# Patient Record
Sex: Male | Born: 1965
Health system: Southern US, Community
[De-identification: ages and names within clinical notes are randomized; demographics above are authoritative.]

## PROBLEM LIST (undated history)

## (undated) DIAGNOSIS — K439 Ventral hernia without obstruction or gangrene: Secondary | ICD-10-CM

## (undated) DIAGNOSIS — K219 Gastro-esophageal reflux disease without esophagitis: Secondary | ICD-10-CM

## (undated) DIAGNOSIS — E785 Hyperlipidemia, unspecified: Secondary | ICD-10-CM

## (undated) HISTORY — PX: BACK SURGERY: SHX140

## (undated) HISTORY — PX: HERNIA REPAIR: SHX51

## (undated) HISTORY — PX: UMBILICAL HERNIA REPAIR: SHX196

## (undated) HISTORY — PX: ADENOIDECTOMY: SHX5191

---

## 1988-07-29 HISTORY — PX: WRIST FRACTURE SURGERY: SHX121

## 1988-07-29 HISTORY — PX: ORIF CLAVICULAR FRACTURE: SHX5055

## 1999-03-05 ENCOUNTER — Encounter: Payer: Self-pay | Admitting: Emergency Medicine

## 1999-03-05 ENCOUNTER — Emergency Department (HOSPITAL_COMMUNITY): Admission: EM | Admit: 1999-03-05 | Discharge: 1999-03-05 | Payer: Self-pay | Admitting: Emergency Medicine

## 2001-09-25 ENCOUNTER — Ambulatory Visit (HOSPITAL_COMMUNITY): Admission: RE | Admit: 2001-09-25 | Discharge: 2001-09-25 | Payer: Self-pay | Admitting: Family Medicine

## 2001-09-25 ENCOUNTER — Encounter: Payer: Self-pay | Admitting: Family Medicine

## 2001-10-15 ENCOUNTER — Ambulatory Visit (HOSPITAL_COMMUNITY): Admission: RE | Admit: 2001-10-15 | Discharge: 2001-10-16 | Payer: Self-pay | Admitting: Neurosurgery

## 2001-10-15 ENCOUNTER — Encounter: Payer: Self-pay | Admitting: Neurosurgery

## 2001-10-27 HISTORY — PX: MICRODISCECTOMY LUMBAR: SUR864

## 2005-06-17 ENCOUNTER — Ambulatory Visit: Payer: Self-pay | Admitting: Cardiovascular Disease

## 2005-10-28 ENCOUNTER — Emergency Department (HOSPITAL_COMMUNITY): Admission: EM | Admit: 2005-10-28 | Discharge: 2005-10-28 | Payer: Self-pay | Admitting: Emergency Medicine

## 2011-04-08 ENCOUNTER — Encounter (INDEPENDENT_AMBULATORY_CARE_PROVIDER_SITE_OTHER): Payer: Self-pay | Admitting: General Surgery

## 2011-06-27 ENCOUNTER — Encounter (INDEPENDENT_AMBULATORY_CARE_PROVIDER_SITE_OTHER): Payer: Self-pay | Admitting: General Surgery

## 2011-07-12 ENCOUNTER — Ambulatory Visit (INDEPENDENT_AMBULATORY_CARE_PROVIDER_SITE_OTHER): Payer: BC Managed Care – PPO | Admitting: General Surgery

## 2011-07-12 ENCOUNTER — Encounter (INDEPENDENT_AMBULATORY_CARE_PROVIDER_SITE_OTHER): Payer: Self-pay | Admitting: General Surgery

## 2011-07-12 ENCOUNTER — Other Ambulatory Visit (INDEPENDENT_AMBULATORY_CARE_PROVIDER_SITE_OTHER): Payer: Self-pay | Admitting: General Surgery

## 2011-07-12 VITALS — BP 146/98 | HR 68 | Temp 96.8°F | Resp 16 | Ht 72.0 in | Wt 220.0 lb

## 2011-07-12 DIAGNOSIS — K439 Ventral hernia without obstruction or gangrene: Secondary | ICD-10-CM

## 2011-07-12 NOTE — Patient Instructions (Signed)
Hernia Repair with Laparoscope A hernia occurs when an internal organ pushes out through a weak spot in the belly (abdominal) wall muscles. Hernias most commonly occur in the groin and around the navel. Hernias can also occur through a cut by the surgeon (incision) after an abdominal operation. A hernia may be caused by:  Lifting heavy objects.   Prolonged coughing.   Straining to move your bowels.  Hernias can often be pushed back into place (reduced). Most hernias tend to get worse over time. Problems occur when abdominal contents get stuck in the opening and the blood supply is blocked or impaired (incarcerated hernia). Because of these risks, you require surgery to repair the hernia. Your hernia will be repaired using a laparoscope. Laparoscopic surgery is a type of minimally invasive surgery. It does not involve making a typical surgical cut (incision) in the skin. A laparoscope is a telescope-like rod and lens system. It is usually connected to a video camera and a light source so your caregiver can clearly see the operative area. The instruments are inserted through  to  inch (5 mm or 10 mm) openings in the skin at specific locations. A working and viewing space is created by blowing a small amount of carbon dioxide gas into the abdominal cavity. The abdomen is essentially blown up like a balloon (insufflated). This elevates the abdominal wall above the internal organs like a dome. The carbon dioxide gas is common to the human body and can be absorbed by tissue and removed by the respiratory system. Once the repair is completed, the small incisions will be closed with either stitches (sutures) or staples (just like a paper stapler only this staple holds the skin together). LET YOUR CAREGIVERS KNOW ABOUT:  Allergies.   Medications taken including herbs, eye drops, over the counter medications, and creams.   Use of steroids (by mouth or creams).   Previous problems with anesthetics or  Novocaine.   Possibility of pregnancy, if this applies.   History of blood clots (thrombophlebitis).   History of bleeding or blood problems.   Previous surgery.   Other health problems.  BEFORE THE PROCEDURE  Laparoscopy can be done either in a hospital or out-patient clinic. You may be given a mild sedative to help you relax before the procedure. Once in the operating room, you will be given a general anesthesia to make you sleep (unless you and your caregiver choose a different anesthetic).  AFTER THE PROCEDURE  After the procedure you will be watched in a recovery area. Depending on what type of hernia was repaired, you might be admitted to the hospital or you might go home the same day. With this procedure you may have less pain and scarring. This usually results in a quicker recovery and less risk of infection. HOME CARE INSTRUCTIONS   Bed rest is not required. You may continue your normal activities but avoid heavy lifting (more than 10 pounds) or straining.   Cough gently. If you are a smoker it is best to stop, as even the best hernia repair can break down with the continual strain of coughing.   Avoid driving until given the OK by your surgeon.   There are no dietary restrictions unless given otherwise.   TAKE ALL MEDICATIONS AS DIRECTED.   Only take over-the-counter or prescription medicines for pain, discomfort, or fever as directed by your caregiver.  SEEK MEDICAL CARE IF:   There is increasing abdominal pain or pain in your incisions.  There is more bleeding from incisions, other than minimal spotting.   You feel light headed or faint.   You develop an unexplained fever, chills, and/or an oral temperature above 102 F (38.9 C).   You have redness, swelling, or increasing pain in the wound.   Pus coming from wound.   A foul smell coming from the wound or dressings.  SEEK IMMEDIATE MEDICAL CARE IF:   You develop a rash.   You have difficulty breathing.    You have any allergic problems.  MAKE SURE YOU:   Understand these instructions.   Will watch your condition.   Will get help right away if you are not doing well or get worse.  Document Released: 08/15/2005 Document Revised: 04/27/2011 Document Reviewed: 07/15/2009 [facility] Patient Information 2012 [place], [place].

## 2011-07-12 NOTE — Progress Notes (Signed)
Subjective:   Recurrent hernia  Patient ID: Brian [name], male   DOB: 01/12/1966, 45 y.o.   MRN: [id number]  HPI Patient returns to the office for followup of his recurrent supraumbilical hernia. I had repaired his hernia with a 6 cm ventral patch in 2011. He had a fairly quick recurrence that I have followed  since early this year. I had previously recommended repair but he was minimally symptomatic and deferred. He now however is having some increasing discomfort in the area with exercise. No GI symptoms. History reviewed. No pertinent past medical history. Past Surgical History  Procedure Date  . Hernia repair 08/03/2011  . Back surgery 10/2001  . Shoulder surgery 07/1988  . Wrist surgery 07/1988   Current Outpatient Prescriptions  Medication Sig Dispense Refill  . Niacin-Simvastatin (SIMCOR) 500-40 MG TB24 Take by mouth daily.         No Known Allergies History  Substance Use Topics  . Smoking status: Never Smoker   . Smokeless tobacco: Never Used  . Alcohol Use: No     Review of Systems  HENT: Negative.   Respiratory: Negative.   Cardiovascular: Negative.   Gastrointestinal: Negative for nausea and vomiting.       Objective:   Physical Exam General: Moderately overweight Caucasian male in no acute distress Skin: Warm and dry without rash or infection HEENT: No palpable masses. Oropharynx clear. Lungs: Clear without wheezing or increased work of breathing Cardiovascular: Regular rate and rhythm without murmur Abdomen: Just above and to the right of the umbilicus is an approximately 5 or 6 cm palpable hernia that is reducible, slightly tender, and feels becoming through a much smaller defect.    Assessment:     Recurrent supraumbilical hernia following open repair with ventral patch. As we had previously discussed earlier this year I recommended proceeding with laparoscopic repair. We discussed the nature of the procedure and its indications and risks of general  anesthesia, leaving, infection, recurrence, rare risk of bowel or visceral injury or chronic scarring and pain.All his qestions were answered and he is ready to proceed    Plan:     Laparoscopic repair of a recurrent supraumbilical hernia as an outpatient under general anesthesia.      

## 2011-07-18 ENCOUNTER — Encounter (HOSPITAL_COMMUNITY): Payer: Self-pay | Admitting: Pharmacy Technician

## 2011-07-18 ENCOUNTER — Encounter (HOSPITAL_COMMUNITY)
Admission: RE | Admit: 2011-07-18 | Discharge: 2011-07-18 | Disposition: A | Discharge status: Home / Self Care | Payer: BC Managed Care – PPO | Source: Ambulatory Visit | Attending: General Surgery | Admitting: General Surgery

## 2011-07-18 ENCOUNTER — Encounter (HOSPITAL_COMMUNITY): Payer: Self-pay

## 2011-07-18 DIAGNOSIS — E785 Hyperlipidemia, unspecified: Secondary | ICD-10-CM | POA: Insufficient documentation

## 2011-07-18 DIAGNOSIS — K439 Ventral hernia without obstruction or gangrene: Secondary | ICD-10-CM | POA: Insufficient documentation

## 2011-07-18 DIAGNOSIS — K219 Gastro-esophageal reflux disease without esophagitis: Secondary | ICD-10-CM | POA: Insufficient documentation

## 2011-07-18 HISTORY — DX: Ventral hernia without obstruction or gangrene: K43.9

## 2011-07-18 HISTORY — DX: Hyperlipidemia, unspecified: E78.5

## 2011-07-18 HISTORY — DX: Gastro-esophageal reflux disease without esophagitis: K21.9

## 2011-07-18 LAB — SURGICAL PCR SCREEN
MRSA, PCR: NEGATIVE
Staphylococcus aureus: POSITIVE — AB

## 2011-07-18 MED ORDER — CEFAZOLIN SODIUM 1-5 GM-% IV SOLN: 1.0000 g | INTRAVENOUS | Status: DC

## 2011-07-18 NOTE — Patient Instructions (Signed)
[name]  07/18/2011   Your procedure is scheduled on:  07-27-11  Report to [facility] at  0630 AM.  Call this number if you have problems the morning of surgery: [phone]   Remember:   Do not eat food:After Midnight.  Do not drink clear liquids: After Midnight.  Take these medicines the morning of surgery with A SIP OF WATER: omeprazole   Do not wear jewelry, make-up or nail polish.  Do not wear lotions, powders, or perfumes. You may wear deodorant.  Do not shave 48 hours prior to surgery.  Do not bring valuables to the hospital.  Contacts, dentures or bridgework may not be worn into surgery.  Leave suitcase in the car. After surgery it may be brought to your room.  For patients admitted to the hospital, checkout time is 11:00 AM the day of discharge.   Patients discharged the day of surgery will not be allowed to drive home.  Name and phone number of your driver:   Phelix Fudala, [phone]  Special Instructions: CHG Shower Use Special Wash: 1/2 bottle night before surgery and 1/2 bottle morning of surgery.   Please read over the following fact sheets that you were given: MRSA Information

## 2011-07-18 NOTE — Pre-Procedure Instructions (Addendum)
07-18-11 No labs required, EKG, CXR. States had EKG with PCP-Dr. Rosalene Billings called for to be faxed. 07-26-11 EKG(05-24-11) report with chart.

## 2011-07-27 ENCOUNTER — Ambulatory Visit (HOSPITAL_COMMUNITY): Payer: BC Managed Care – PPO | Admitting: Anesthesiology

## 2011-07-27 ENCOUNTER — Observation Stay (HOSPITAL_COMMUNITY)
Admission: AD | Admit: 2011-07-27 | Discharge: 2011-07-28 | DRG: 160 | Disposition: A | Discharge status: Home / Self Care | Payer: BC Managed Care – PPO | Source: Ambulatory Visit | Attending: General Surgery | Admitting: General Surgery

## 2011-07-27 ENCOUNTER — Encounter (HOSPITAL_COMMUNITY)
Admission: AD | Disposition: A | Discharge status: Home / Self Care | Payer: Self-pay | Source: Ambulatory Visit | Attending: General Surgery

## 2011-07-27 ENCOUNTER — Encounter (HOSPITAL_COMMUNITY): Payer: Self-pay | Admitting: Anesthesiology

## 2011-07-27 ENCOUNTER — Encounter (HOSPITAL_COMMUNITY): Payer: Self-pay

## 2011-07-27 DIAGNOSIS — K439 Ventral hernia without obstruction or gangrene: Principal | ICD-10-CM | POA: Diagnosis present

## 2011-07-27 DIAGNOSIS — Z01812 Encounter for preprocedural laboratory examination: Secondary | ICD-10-CM | POA: Insufficient documentation

## 2011-07-27 HISTORY — PX: VENTRAL HERNIA REPAIR: SHX424

## 2011-07-27 LAB — CREATININE, SERUM
Creatinine, Ser: 1.03 mg/dL (ref 0.50–1.35)
GFR calc Af Amer: >90 mL/min (ref >90)
GFR calc non Af Amer: 85 mL/min — ABNORMAL LOW (ref >90)

## 2011-07-27 LAB — CBC
HCT: 39.8 % (ref 39.0–52.0)
Hemoglobin: 13.7 g/dL (ref 13.0–17.0)
MCH: 31.4 pg (ref 26.0–34.0)
MCHC: 34.4 g/dL (ref 30.0–36.0)
MCV: 91.1 fL (ref 78.0–100.0)
Platelets: 192 10*3/uL (ref 150–400)
RBC: 4.37 MIL/uL (ref 4.22–5.81)
RDW: 12.6 % (ref 11.5–15.5)
WBC: 6 10*3/uL (ref 4.0–10.5)

## 2011-07-27 SURGERY — REPAIR, HERNIA, VENTRAL, LAPAROSCOPIC
Anesthesia: General | Site: Abdomen | Wound class: Clean

## 2011-07-27 MED ORDER — HYDROMORPHONE HCL PF 1 MG/ML IJ SOLN
INTRAMUSCULAR | Status: DC | PRN
  Administered 2011-07-27 (×2): 1 mg via INTRAVENOUS

## 2011-07-27 MED ORDER — HYDROMORPHONE HCL PF 1 MG/ML IJ SOLN: 0.2500 mg | INTRAMUSCULAR | Status: DC | PRN

## 2011-07-27 MED ORDER — DEXTROSE IN LACTATED RINGERS 5 % IV SOLN
INTRAVENOUS | Status: DC
  Administered 2011-07-27 – 2011-07-28 (×2): via INTRAVENOUS

## 2011-07-27 MED ORDER — ACETAMINOPHEN 325 MG PO TABS
650.0000 mg | ORAL_TABLET | ORAL | Status: DC | PRN
  Administered 2011-07-27: 650 mg via ORAL
  Filled 2011-07-27: qty 2

## 2011-07-27 MED ORDER — PROPOFOL 10 MG/ML IV EMUL
INTRAVENOUS | Status: DC | PRN
  Administered 2011-07-27: 150 mg via INTRAVENOUS

## 2011-07-27 MED ORDER — ACETAMINOPHEN 10 MG/ML IV SOLN
INTRAVENOUS | Status: DC | PRN
  Administered 2011-07-27: 1000 mg via INTRAVENOUS

## 2011-07-27 MED ORDER — MIDAZOLAM HCL 5 MG/5ML IJ SOLN
INTRAMUSCULAR | Status: DC | PRN
  Administered 2011-07-27: 2 mg via INTRAVENOUS

## 2011-07-27 MED ORDER — BUPIVACAINE-EPINEPHRINE 0.25% -1:200000 IJ SOLN
INTRAMUSCULAR | Status: DC | PRN
  Administered 2011-07-27: 60 mL

## 2011-07-27 MED ORDER — OXYCODONE-ACETAMINOPHEN 5-325 MG PO TABS
1.0000 | ORAL_TABLET | ORAL | Status: DC | PRN
  Administered 2011-07-27 – 2011-07-28 (×2): 1 via ORAL
  Administered 2011-07-28: 2 via ORAL
  Administered 2011-07-28: 1 via ORAL
  Filled 2011-07-27 (×5): qty 1

## 2011-07-27 MED ORDER — PROMETHAZINE HCL 25 MG/ML IJ SOLN: 6.2500 mg | INTRAMUSCULAR | Status: DC | PRN

## 2011-07-27 MED ORDER — ONDANSETRON HCL 4 MG/2ML IJ SOLN
INTRAMUSCULAR | Status: DC | PRN
  Administered 2011-07-27: 4 mg via INTRAVENOUS

## 2011-07-27 MED ORDER — DEXAMETHASONE SODIUM PHOSPHATE 10 MG/ML IJ SOLN
INTRAMUSCULAR | Status: DC | PRN
  Administered 2011-07-27: 10 mg via INTRAVENOUS

## 2011-07-27 MED ORDER — ONDANSETRON HCL 4 MG/2ML IJ SOLN: 4.0000 mg | Freq: Four times a day (QID) | INTRAMUSCULAR | Status: DC | PRN

## 2011-07-27 MED ORDER — NEOSTIGMINE METHYLSULFATE 1 MG/ML IJ SOLN
INTRAMUSCULAR | Status: DC | PRN
  Administered 2011-07-27: 3 mg via INTRAVENOUS

## 2011-07-27 MED ORDER — HEPARIN SODIUM (PORCINE) 5000 UNIT/ML IJ SOLN
5000.0000 [IU] | Freq: Three times a day (TID) | INTRAMUSCULAR | Status: DC
  Administered 2011-07-27 – 2011-07-28 (×2): 5000 [IU] via SUBCUTANEOUS
  Filled 2011-07-27 (×5): qty 1

## 2011-07-27 MED ORDER — ROCURONIUM BROMIDE 100 MG/10ML IV SOLN
INTRAVENOUS | Status: DC | PRN
  Administered 2011-07-27 (×2): 10 mg via INTRAVENOUS
  Administered 2011-07-27: 50 mg via INTRAVENOUS
  Administered 2011-07-27: 10 mg via INTRAVENOUS

## 2011-07-27 MED ORDER — FENTANYL CITRATE 0.05 MG/ML IJ SOLN
INTRAMUSCULAR | Status: DC | PRN
  Administered 2011-07-27: 50 ug via INTRAVENOUS
  Administered 2011-07-27: 150 ug via INTRAVENOUS
  Administered 2011-07-27: 50 ug via INTRAVENOUS

## 2011-07-27 MED ORDER — LIDOCAINE HCL (CARDIAC) 20 MG/ML IV SOLN
INTRAVENOUS | Status: DC | PRN
  Administered 2011-07-27: 100 mg via INTRAVENOUS

## 2011-07-27 MED ORDER — LACTATED RINGERS IV SOLN
INTRAVENOUS | Status: DC | PRN
  Administered 2011-07-27 (×2): via INTRAVENOUS

## 2011-07-27 MED ORDER — LACTATED RINGERS IV SOLN: INTRAVENOUS | Status: DC

## 2011-07-27 MED ORDER — MORPHINE SULFATE 2 MG/ML IJ SOLN: 2.0000 mg | INTRAMUSCULAR | Status: DC | PRN

## 2011-07-27 MED ORDER — ONDANSETRON HCL 4 MG PO TABS: 4.0000 mg | ORAL_TABLET | Freq: Four times a day (QID) | ORAL | Status: DC | PRN

## 2011-07-27 MED ORDER — CEFAZOLIN SODIUM-DEXTROSE 2-3 GM-% IV SOLR
2.0000 g | INTRAVENOUS | Status: AC
  Administered 2011-07-27: 2 g via INTRAVENOUS

## 2011-07-27 MED ORDER — SODIUM CHLORIDE 0.9 % IR SOLN
Status: DC | PRN
  Administered 2011-07-27: 1000 mL

## 2011-07-27 MED ORDER — GLYCOPYRROLATE 0.2 MG/ML IJ SOLN
INTRAMUSCULAR | Status: DC | PRN
  Administered 2011-07-27: .4 mg via INTRAVENOUS

## 2011-07-27 SURGICAL SUPPLY — 51 items
APPLIER CLIP 5 13 M/L LIGAMAX5 (MISCELLANEOUS)
BENZOIN TINCTURE PRP APPL 2/3 (GAUZE/BANDAGES/DRESSINGS) IMPLANT
BINDER ABD UNIV 10 28-50 (GAUZE/BANDAGES/DRESSINGS) ×1 IMPLANT
BINDER ABD UNIV 12 45-62 (WOUND CARE) IMPLANT
BINDER ABDOM UNIV 10 (GAUZE/BANDAGES/DRESSINGS) ×2
BINDER ABDOMINAL 46IN 62IN (WOUND CARE)
CANISTER SUCTION 2500CC (MISCELLANEOUS) ×2 IMPLANT
CLIP APPLIE 5 13 M/L LIGAMAX5 (MISCELLANEOUS) IMPLANT
CLOTH BEACON ORANGE TIMEOUT ST (SAFETY) ×2 IMPLANT
DECANTER SPIKE VIAL GLASS SM (MISCELLANEOUS) IMPLANT
DERMABOND ADVANCED (GAUZE/BANDAGES/DRESSINGS) ×1
DERMABOND ADVANCED .7 DNX12 (GAUZE/BANDAGES/DRESSINGS) ×1 IMPLANT
DEVICE SECURE STRAP 25 ABSORB (INSTRUMENTS) ×2 IMPLANT
DEVICE TROCAR PUNCTURE CLOSURE (ENDOMECHANICALS) ×2 IMPLANT
DISSECTOR BLUNT TIP ENDO 5MM (MISCELLANEOUS) IMPLANT
DRAPE INCISE IOBAN 66X45 STRL (DRAPES) ×2 IMPLANT
DRAPE LAPAROSCOPIC ABDOMINAL (DRAPES) ×2 IMPLANT
ELECT REM PT RETURN 9FT ADLT (ELECTROSURGICAL) ×2
ELECTRODE REM PT RTRN 9FT ADLT (ELECTROSURGICAL) ×1 IMPLANT
GLOVE BIOGEL PI IND STRL 7.0 (GLOVE) ×1 IMPLANT
GLOVE BIOGEL PI INDICATOR 7.0 (GLOVE) ×1
GOWN STRL NON-REIN LRG LVL3 (GOWN DISPOSABLE) ×2 IMPLANT
GOWN STRL REIN XL XLG (GOWN DISPOSABLE) ×4 IMPLANT
KIT BASIN OR (CUSTOM PROCEDURE TRAY) ×2 IMPLANT
MESH PARIETEX 6X4 (Mesh General) ×2 IMPLANT
NEEDLE SPNL 22GX3.5 QUINCKE BK (NEEDLE) ×2 IMPLANT
NS IRRIG 1000ML POUR BTL (IV SOLUTION) ×2 IMPLANT
PEN SKIN MARKING BROAD (MISCELLANEOUS) ×2 IMPLANT
PENCIL BUTTON HOLSTER BLD 10FT (ELECTRODE) ×2 IMPLANT
SCALPEL HARMONIC ACE (MISCELLANEOUS) IMPLANT
SCISSORS LAP 5X35 DISP (ENDOMECHANICALS) IMPLANT
SET IRRIG TUBING LAPAROSCOPIC (IRRIGATION / IRRIGATOR) IMPLANT
SLEEVE ADV FIXATION 5X100MM (TROCAR) IMPLANT
SLEEVE Z-THREAD 5X100MM (TROCAR) ×2 IMPLANT
SOLUTION ANTI FOG 6CC (MISCELLANEOUS) ×2 IMPLANT
STRIP CLOSURE SKIN 1/2X4 (GAUZE/BANDAGES/DRESSINGS) IMPLANT
SUT MNCRL AB 4-0 PS2 18 (SUTURE) ×2 IMPLANT
SUT NOVA NAB GS-21 0 18 T12 DT (SUTURE) ×4 IMPLANT
SUT PROLENE 0 CT 1 CR/8 (SUTURE) IMPLANT
TACKER 5MM HERNIA 3.5CML NAB (ENDOMECHANICALS) IMPLANT
TOWEL OR 17X26 10 PK STRL BLUE (TOWEL DISPOSABLE) ×2 IMPLANT
TRAY FOLEY CATH 14FRSI W/METER (CATHETERS) IMPLANT
TRAY LAP CHOLE (CUSTOM PROCEDURE TRAY) ×2 IMPLANT
TROCAR ADV FIXATION 11X100MM (TROCAR) IMPLANT
TROCAR ADV FIXATION 5X100MM (TROCAR) IMPLANT
TROCAR BLADELESS OPT 5 75 (ENDOMECHANICALS) ×2 IMPLANT
TROCAR XCEL NON-BLD 11X100MML (ENDOMECHANICALS) IMPLANT
TROCAR Z-THREAD FIOS 11X100 BL (TROCAR) ×2 IMPLANT
TROCAR Z-THREAD FIOS 5X100MM (TROCAR) ×2 IMPLANT
TROCAR Z-THREAD SLEEVE 11X100 (TROCAR) IMPLANT
TUBING INSUFFLATION 10FT LAP (TUBING) ×2 IMPLANT

## 2011-07-27 NOTE — H&P (View-Only) (Signed)
Subjective:   Recurrent hernia  Patient ID: Brian [name], male   DOB: 1966/01/02, 45 y.o.   MRN: [id number]  HPI Patient returns to the office for followup of his recurrent supraumbilical hernia. I had repaired his hernia with a 6 cm ventral patch in 2011. He had a fairly quick recurrence that I have followed  since early this year. I had previously recommended repair but he was minimally symptomatic and deferred. He now however is having some increasing discomfort in the area with exercise. No GI symptoms. History reviewed. No pertinent past medical history. Past Surgical History  Procedure Date  . Hernia repair 08/03/2011  . Back surgery 10/2001  . Shoulder surgery 07/1988  . Wrist surgery 07/1988   Current Outpatient Prescriptions  Medication Sig Dispense Refill  . Niacin-Simvastatin [facility]) 500-40 MG TB24 Take by mouth daily.         No Known Allergies History  Substance Use Topics  . Smoking status: Never Smoker   . Smokeless tobacco: Never Used  . Alcohol Use: No     Review of Systems  HENT: Negative.   Respiratory: Negative.   Cardiovascular: Negative.   Gastrointestinal: Negative for nausea and vomiting.       Objective:   Physical Exam General: Moderately overweight Caucasian male in no acute distress Skin: Warm and dry without rash or infection HEENT: No palpable masses. Oropharynx clear. Lungs: Clear without wheezing or increased work of breathing Cardiovascular: Regular rate and rhythm without murmur Abdomen: Just above and to the right of the umbilicus is an approximately 5 or 6 cm palpable hernia that is reducible, slightly tender, and feels becoming through a much smaller defect.    Assessment:     Recurrent supraumbilical hernia following open repair with ventral patch. As we had previously discussed earlier this year I recommended proceeding with laparoscopic repair. We discussed the nature of the procedure and its indications and risks of general  anesthesia, leaving, infection, recurrence, rare risk of bowel or visceral injury or chronic scarring and pain.All his qestions were answered and he is ready to proceed    Plan:     Laparoscopic repair of a recurrent supraumbilical hernia as an outpatient under general anesthesia.

## 2011-07-27 NOTE — Anesthesia Postprocedure Evaluation (Signed)
  Anesthesia Post-op Note  Patient: Brian [name]  Procedure(s) Performed:  LAPAROSCOPIC VENTRAL HERNIA  Patient Location: PACU  Anesthesia Type: General  Level of Consciousness: oriented and sedated  Airway and Oxygen Therapy: Patient Spontanous Breathing and Patient connected to nasal cannula oxygen  Post-op Pain: mild  Post-op Assessment: Post-op Vital signs reviewed, Patient's Cardiovascular Status Stable, Respiratory Function Stable and Patent Airway  Post-op Vital Signs: stable  Complications: No apparent anesthesia complications

## 2011-07-27 NOTE — Preoperative (Signed)
Beta Blockers   Reason not to administer Beta Blockers:Not Applicable 

## 2011-07-27 NOTE — Transfer of Care (Signed)
Immediate Anesthesia Transfer of Care Note  Patient: Brian [name]  Procedure(s) Performed:  LAPAROSCOPIC VENTRAL HERNIA  Patient Location: PACU  Anesthesia Type: General  Level of Consciousness: awake, alert  and oriented  Airway & Oxygen Therapy: Patient Spontanous Breathing and Patient connected to face mask oxygen  Post-op Assessment: Report given to PACU RN and Post -op Vital signs reviewed and stable  Post vital signs: Reviewed and stable  Complications: No apparent anesthesia complications

## 2011-07-27 NOTE — Interval H&P Note (Signed)
History and Physical Interval Note:   07/27/2011   8:27 AM   Brian [name]  has presented today for surgery, with the diagnosis of laparoscopic ventral hernia  The various methods of treatment have been discussed with the patient and family. After consideration of risks, benefits and other options for treatment, the patient has consented to  Procedure(s): LAPAROSCOPIC VENTRAL HERNIA as a surgical intervention .  The patients' history has been reviewed, patient examined, no change in status, stable for surgery.  I have reviewed the patients' chart and labs.  Questions were answered to the patient's satisfaction.     Mariella Saa  MD 07/27/2011

## 2011-07-27 NOTE — Progress Notes (Signed)
CBC AND SERUM CREATININE DRAWN BY LAB. 

## 2011-07-27 NOTE — Anesthesia Preprocedure Evaluation (Signed)
Anesthesia Evaluation  Patient identified by MRN, date of birth, ID band Patient awake    Reviewed: Allergy & Precautions, H&P , NPO status , Patient's Chart, lab work & pertinent test results, reviewed documented beta blocker date and time   Airway Mallampati: II TM Distance: >3 FB Neck ROM: Full    Dental No notable dental hx.    Pulmonary neg pulmonary ROS,  clear to auscultation        Cardiovascular neg cardio ROS Regular Normal Denies cardiac symptoms   Neuro/Psych Negative Neurological ROS  Negative Psych ROS   GI/Hepatic negative GI ROS, Neg liver ROS,   Endo/Other  Negative Endocrine ROS  Renal/GU negative Renal ROS  Genitourinary negative   Musculoskeletal negative musculoskeletal ROS (+)   Abdominal   Peds negative pediatric ROS (+)  Hematology negative hematology ROS (+)   Anesthesia Other Findings   Reproductive/Obstetrics negative OB ROS                           Anesthesia Physical Anesthesia Plan  ASA: I  Anesthesia Plan: General   Post-op Pain Management:    Induction: Intravenous  Airway Management Planned: Oral ETT  Additional Equipment:   Intra-op Plan:   Post-operative Plan: Extubation in OR  Informed Consent: I have reviewed the patients History and Physical, chart, labs and discussed the procedure including the risks, benefits and alternatives for the proposed anesthesia with the patient or authorized representative who has indicated his/her understanding and acceptance.   Dental advisory given  Plan Discussed with: CRNA and Surgeon  Anesthesia Plan Comments:         Anesthesia Quick Evaluation

## 2011-07-27 NOTE — Op Note (Signed)
  Surgeon: Glenna Fellows T   Assistants: None  Anesthesia: General endotracheal anesthesia  Indications: patient has a history of open umbilical hernia repair with ventral patch by me with an early recurrence about 2 cm above the umbilicus in the midline. He now has an increasingly symptomatic hernia that is reducible again presenting about 2-3 cm above the previous umbilical hernia repair. I recommended laparoscopic repair with a much larger piece of intraperitoneal mesh and then open closure of the defect over the mesh. We discussed the nature of the procedure, recovery and possible risks of recurrence, anesthetic complications, bowel injury, and chronic pain. He is brought to the operating room for this procedure.    Procedure Detail: patient is brought to the operating room, placed in the supine position on the operating table and general endotracheal anesthesia induced. He received preoperative IV antibiotics. PAS were in place. The patient time out was performed and correct procedure verified. He had had his abdomen widely prepped and Ioban drape applied. Trocar sites were anesthetized. Access was obtained with a 5 mm Optiview trocar in the left upper quadrant without difficulty. There was no evidence of trocar injury. Under direct vision an additional 11 mm trocar and a 5 mm trocar were placed along the left lateral abdomen. Omental adhesions were noted up into the hernia defect. These were completely taken down with cautery and scissor dissection. There were no bowel adhesions. The defect measured about 2-1/2 cm in diameter and was just above the superior edge of the previously placed ventral patch which appeared to be in position and deployed. I took down the falciform ligament for several centimeters with cautery to allow wide deployment of intraperitoneal mesh. A 15 x 10 cm piece of Parietex mesh was chosen. 8 0 Prolene sutures were placed around the periphery of the mesh.  It was  moistened, rolled and introduced into the abdominal cavity and deployed out smoothly. Using a corresponding small stab incisions the anterior abdominal wall the sutures were brought up through the anterior abdominal wall and the mesh nicely deployed very smoothly in all directions with wide coverage of the hernia defect. The secure strap tacker was then used to further affix the mesh at the edges and more centrally around the hernia defect. Following this I made a small incision anteriorly over the hernia defect and dissection was carried down to the fascial edges. The fascia was closed transversely with interrupted oh Novafil sutures. Laparoscopy and showed no evidence of bleeding, bowel injury, or other problems. All CO2 was evacuated and trochars removed. Skin incisions were closed with subcuticular Monocryl and Dermabond. Patient was taken to recovery in good condition. Sponge needle and instrument counts were correc    Estimated Blood Loss:  Minimal         Drains: None          Blood Given: none          Specimens: None        Complications:  * No complications entered in OR log *         Disposition: PACU - hemodynamically stable.         Condition: stable  Mariella Saa MD, FACS  07/27/2011, 10:53 AM

## 2011-07-27 NOTE — Progress Notes (Signed)
Dr. Shireen Quan made aware of patient's heart rates

## 2011-07-28 MED ORDER — OXYCODONE-ACETAMINOPHEN 5-325 MG PO TABS: 1.0000 | ORAL_TABLET | ORAL | Status: AC | PRN

## 2011-07-28 NOTE — Progress Notes (Signed)
Patient ID: Brian [name], male   DOB: 07-Dec-1965, 45 y.o.   MRN: [id number] 1 Day Post-Op  Subjective: Feels "great".  Minimal pain  Objective: Vital signs in last 24 hours: Temp:  [96.8 F (36 C)-98.1 F (36.7 C)] 97.4 F (36.3 C) (11/29 0500) Pulse Rate:  [80-116] 102  (11/29 0500) Resp:  [7-20] 18  (11/29 0500) BP: (112-159)/(50-105) 129/77 mmHg (11/29 0500) SpO2:  [93 %-100 %] 93 % (11/29 0500)    Intake/Output from previous day: 11/28 0701 - 11/29 0700 In: 1740 [P.O.:240; I.V.:1500] Out: 1225 [Urine:1200; Blood:25] Intake/Output this shift:    General appearance: alert and no distress GI: normal findings: soft, non-tender Incision/Wound:Clean without bleeding  Lab Results:   Basename 07/27/11 1229  WBC 6.0  HGB 13.7  HCT 39.8  PLT 192   BMET  Basename 07/27/11 1229  NA --  K --  CL --  CO2 --  GLUCOSE --  BUN --  CREATININE 1.03  CALCIUM --   PT/INR No results found for this basename: LABPROT:2,INR:2 in the last 72 hours ABG No results found for this basename: PHART:2,PCO2:2,PO2:2,HCO3:2 in the last 72 hours  Studies/Results: No results found.  Anti-infectives: Anti-infectives     Start     Dose/Rate Route Frequency Ordered Stop   07/27/11 0745   ceFAZolin (ANCEF) IVPB 2 g/50 mL premix        2 g 100 mL/hr over 30 Minutes Intravenous 60 min pre-op 07/27/11 0736 07/27/11 0843          Assessment/Plan: s/p Procedure(s): LAPAROSCOPIC VENTRAL HERNIA Doing well, OK for discharge   LOS: 1 day    Laurren Lepkowski T 07/28/2011

## 2011-07-28 NOTE — Discharge Summary (Signed)
   Patient ID: Brian [name] [id number] 45 y.o. 1965-09-02  07/27/2011  Discharge date and time: 07/26/2012  Admitting Physician: Glenna Fellows T  Discharge Physician: Glenna Fellows T  Admission Diagnoses: laparoscopic ventral hernia  Discharge Diagnoses: Same  Operations: Procedure(s): LAPAROSCOPIC VENTRAL HERNIA  Admission Condition: good  Discharged Condition: good   Hospital Course: Uneventful lap ventral hernia repair.  Doing well first day without C/O. Discharged home  Disposition: Home  Patient Instructions:   Brian, [name]  Home Medication Instructions WUJ:[id number]   Printed on:07/28/11 0831  Medication Information                    Niacin-Simvastatin [facility]) 500-40 MG TB24 Take 1 tablet by mouth at bedtime.            ibuprofen (ADVIL,MOTRIN) 200 MG tablet Take 400-800 mg by mouth every 6 (six) hours as needed. PAIN              aspirin 325 MG tablet Take 650-1,300 mg by mouth every 6 (six) hours as needed. PAIN             fish oil-omega-3 fatty acids 1000 MG capsule Take 3 g by mouth every other day.             diphenhydrAMINE (BENADRYL) 25 MG tablet Take 25-50 mg by mouth at bedtime as needed. SLEEP           omeprazole (PRILOSEC) 20 MG capsule Take 20 mg by mouth every morning.             oxyCODONE-acetaminophen (PERCOCET) 5-325 MG per tablet Take 1-2 tablets by mouth every 4 (four) hours as needed.             Activity: no heavy lifting for 3 weeks Diet: regular diet Wound Care: none needed  Follow-up:  With Dr Brian [name] in 3 week.  Signed: Mariella Saa MD, FACS  07/28/2011, 8:31 AM

## 2011-07-29 ENCOUNTER — Encounter (HOSPITAL_COMMUNITY): Payer: Self-pay | Admitting: General Surgery

## 2011-08-01 ENCOUNTER — Telehealth (INDEPENDENT_AMBULATORY_CARE_PROVIDER_SITE_OTHER): Payer: Self-pay

## 2011-08-02 NOTE — Telephone Encounter (Signed)
Brian [name] reports he's doing well since surgery, he was given his po appt date & time.

## 2011-08-26 ENCOUNTER — Encounter (INDEPENDENT_AMBULATORY_CARE_PROVIDER_SITE_OTHER): Payer: Self-pay | Admitting: General Surgery

## 2011-08-26 ENCOUNTER — Ambulatory Visit (INDEPENDENT_AMBULATORY_CARE_PROVIDER_SITE_OTHER): Payer: BC Managed Care – PPO | Admitting: General Surgery

## 2011-08-26 VITALS — BP 148/102 | HR 88 | Temp 97.9°F | Resp 16 | Ht 72.0 in | Wt 218.4 lb

## 2011-08-26 DIAGNOSIS — Z09 Encounter for follow-up examination after completed treatment for conditions other than malignant neoplasm: Secondary | ICD-10-CM

## 2011-08-26 NOTE — Progress Notes (Signed)
Patient returns for followup now 6 weeks following laparoscopic repair of his recurrent supraumbilical hernia using a large overlap piece of mesh. He states he feels "great". He's getting back to more strenuous physical activity gradually and has no complaints.  On examination wounds are all well healed in the hernia site feels solid.  Assessment plan doing well following laparoscopic repair of recurrent hernia. We discussed return to full physical activity over the next 4 weeks. He will return as needed.

## 2013-12-19 ENCOUNTER — Telehealth: Payer: Self-pay | Admitting: *Deleted

## 2013-12-19 ENCOUNTER — Encounter: Payer: Self-pay | Admitting: Cardiovascular Disease

## 2013-12-19 NOTE — Telephone Encounter (Signed)
Wife is aware of results Mylo Redebbie Maximum Reiland RN

## 2013-12-19 NOTE — Telephone Encounter (Signed)
Left message to pt about moving appointment to 2:30pm tomorrow instead of 4:00 pm Mylo Redebbie Adreanna Fickel RN

## 2013-12-19 NOTE — Telephone Encounter (Signed)
[facility] inquiring if pt could come in today & be seen by Dr. Lynnette CaffeyNishan Debbie Dejean Tribby RN

## 2013-12-20 ENCOUNTER — Ambulatory Visit: Payer: BC Managed Care – PPO | Admitting: Cardiovascular Disease

## 2013-12-20 NOTE — Telephone Encounter (Signed)
PT  RESCHEDULED APPT  SEE APPOINTMENTS./CY

## 2014-01-16 ENCOUNTER — Ambulatory Visit (INDEPENDENT_AMBULATORY_CARE_PROVIDER_SITE_OTHER): Payer: BC Managed Care – PPO | Admitting: Cardiovascular Disease

## 2014-01-16 ENCOUNTER — Encounter: Payer: Self-pay | Admitting: Cardiovascular Disease

## 2014-01-16 ENCOUNTER — Ambulatory Visit (INDEPENDENT_AMBULATORY_CARE_PROVIDER_SITE_OTHER)
Admission: RE | Admit: 2014-01-16 | Discharge: 2014-01-16 | Disposition: A | Discharge status: Home / Self Care | Payer: BC Managed Care – PPO | Source: Ambulatory Visit | Attending: Cardiovascular Disease | Admitting: Cardiovascular Disease

## 2014-01-16 VITALS — BP 135/94 | HR 69 | Ht 72.0 in | Wt 204.2 lb

## 2014-01-16 DIAGNOSIS — E785 Hyperlipidemia, unspecified: Secondary | ICD-10-CM

## 2014-01-16 DIAGNOSIS — R079 Chest pain, unspecified: Secondary | ICD-10-CM

## 2014-01-16 DIAGNOSIS — Z8249 Family history of ischemic heart disease and other diseases of the circulatory system: Secondary | ICD-10-CM

## 2014-01-16 NOTE — Assessment & Plan Note (Signed)
Chest and shoulder pain are atypical but has family history  Favor ETT and calcium score.  Patient agreeable

## 2014-01-16 NOTE — Patient Instructions (Signed)
Your physician wants you to follow-up in:   YEAR  WITH  DR Haywood FillerNISHAN  You will receive a reminder letter in the mail two months in advance. If you don't receive a letter, please call our office to schedule the follow-up appointment. Your physician recommends that you continue on your current medications as directed. Please refer to the Current Medication list given to you today. Your physician has requested that you have an exercise tolerance test. For further information please visit [email or web address]. Please also follow instruction sheet, as given.  CALCIUM  SCORE  TODAY  IF POSSBILE

## 2014-01-16 NOTE — Progress Notes (Signed)
Patient ID: Brian HeckleWilliam W [name], male   DOB: 02/19/1966, 48 y.o.   MRN: [id number]   48 yo referred for chest pain and positive family history of CAD  I take care of his dad who has had CABG.  Patient has a stressful job last 5 years with [employer].  Married with twins age 49.  Gets some sharp left sided pains radiating to shoulder occasionally with exercise Not at rest Has no orthopedic/shoulder issues.  Has had prior lower back surgery Pain transient.  Has had over a year  No associated diaphoresis, palpitations or dyspnea.  Use to take  niacin and zocor for cholesterol Dr Tenny Crawoss checks and he thinks LDL was under 130  Has not had previous ETT.  Plays basketball on occasion without difficulty Rides stationary bike as well with some shoulder pain       ROS: Denies fever, malais, weight loss, blurry vision, decreased visual acuity, cough, sputum, SOB, hemoptysis, pleuritic pain, palpitaitons, heartburn, abdominal pain, melena, lower extremity edema, claudication, or rash.  All other systems reviewed and negative      General: Affect appropriate Healthy:  appears stated age HEENT: normal Neck supple with no adenopathy JVP normal no bruits no thyromegaly Lungs clear with no wheezing and good diaphragmatic motion Heart:  S1/S2 no murmur,rub, gallop or click PMI normal Abdomen: benighn, BS positve, no tenderness, no AAA no bruit.  No HSM or HJR Distal pulses intact with no bruits No edema Neuro non-focal Skin warm and dry No muscular weakness  Medications Current Outpatient Prescriptions  Medication Sig Dispense Refill  . aspirin 325 MG tablet Take 325 mg by mouth daily. PAIN       . diphenhydrAMINE (BENADRYL) 25 MG tablet Take 25-50 mg by mouth at bedtime as needed. SLEEP      . fish oil-omega-3 fatty acids 1000 MG capsule Take 3 g by mouth every other day.        . ibuprofen (ADVIL,MOTRIN) 200 MG tablet Take 400-800 mg by mouth every 6 (six) hours as needed. PAIN           . Niacin-Simvastatin [facility](SIMCOR) 500-40 MG TB24 Take 1 tablet by mouth at bedtime.       Marland Kitchen. omeprazole (PRILOSEC) 20 MG capsule Take 20 mg by mouth as needed.        No current facility-administered medications for this visit.    Allergies Review of patient's allergies indicates no known allergies.  Family History: Family History  Problem Relation Age of Onset  . Heart disease Father   . Heart disease Paternal Uncle     Social History: History   Social History  . Marital Status: Married    Spouse Name: N/A    Number of Children: N/A  . Years of Education: N/A   Occupational History  . Not on file.   Social History Main Topics  . Smoking status: Never Smoker   . Smokeless tobacco: Never Used  . Alcohol Use: No  . Drug Use: No  . Sexual Activity: Yes   Other Topics Concern  . Not on file   Social History Narrative  . No narrative on file    Electrocardiogram:  NSR rate 69  Normal   Assessment and Plan

## 2014-01-16 NOTE — Assessment & Plan Note (Signed)
Will get lab work from Dr Tenny Crawoss Discussed target LDL under 130  Calcium score will help us decide agressiveness of Rx as well

## 2014-01-22 ENCOUNTER — Telehealth: Payer: Self-pay | Admitting: *Deleted

## 2014-01-22 DIAGNOSIS — R931 Abnormal findings on diagnostic imaging of heart and coronary circulation: Secondary | ICD-10-CM

## 2014-01-22 NOTE — Telephone Encounter (Signed)
Message copied by Alois Cliche on Wed Jan 22, 2014  4:44 PM ------      Message from: Wendall Stade      Created: Fri Jan 17, 2014  3:33 PM       Calcium score not 0 but not real high at 16  Would f/u with ETT that will need to be scheduled            ----- Message -----         From: Rad Results In Interface         Sent: 01/17/2014   8:10 AM           To: Wendall Stade, MD                   ------

## 2014-01-22 NOTE — Telephone Encounter (Signed)
LEFT  MESSAGE TO CALL BACK  RE  CA  SCORE  RESULTS .Zack Seal

## 2014-01-22 NOTE — Telephone Encounter (Signed)
PT  NOTIFIED  WILL FORWARD MESSAGE  TO  [facility] TO SCHEDULE

## 2014-01-22 NOTE — Telephone Encounter (Signed)
Follow up      Returning Christine's call to get test results

## 2014-01-22 NOTE — Telephone Encounter (Signed)
Message copied by Alois Cliche on Wed Jan 22, 2014 11:57 AM ------      Message from: Wendall Stade      Created: Fri Jan 17, 2014  3:33 PM       Calcium score not 0 but not real high at 16  Would f/u with ETT that will need to be scheduled            ----- Message -----         From: Rad Results In Interface         Sent: 01/17/2014   8:10 AM           To: Wendall Stade, MD                   ------

## 2014-01-29 ENCOUNTER — Telehealth: Payer: Self-pay | Admitting: *Deleted

## 2014-01-29 NOTE — Telephone Encounter (Signed)
Message copied by Alois Cliche on Wed Jan 29, 2014 10:27 AM ------      Message from: Wendall Stade      Created: Fri Jan 17, 2014  3:33 PM       Calcium score not 0 but not real high at 16  Would f/u with ETT that will need to be scheduled            ----- Message -----         From: Rad Results In Interface         Sent: 01/17/2014   8:10 AM           To: Wendall Stade, MD                   ------

## 2014-01-29 NOTE — Telephone Encounter (Signed)
PT  HAVING  GXT  DONE ON 03-11-14 AT  9:00  AM WITH  SCOTT WEAVER PAC .Zack Seal

## 2014-03-07 HISTORY — PX: POSTERIOR LAMINECTOMY / DECOMPRESSION LUMBAR SPINE: SUR740

## 2014-03-11 ENCOUNTER — Encounter: Payer: BC Managed Care – PPO | Admitting: Physician Assistant

## 2014-04-15 ENCOUNTER — Encounter: Payer: BC Managed Care – PPO | Admitting: Nurse Practitioner

## 2014-05-02 ENCOUNTER — Encounter (HOSPITAL_COMMUNITY): Payer: BC Managed Care – PPO | Admitting: Anesthesiology

## 2014-05-02 ENCOUNTER — Encounter (HOSPITAL_COMMUNITY): Payer: Self-pay | Admitting: Emergency Medicine

## 2014-05-02 ENCOUNTER — Inpatient Hospital Stay (HOSPITAL_COMMUNITY)
Admission: EM | Admit: 2014-05-02 | Discharge: 2014-05-06 | DRG: 856 | Disposition: A | Discharge status: Home Health | Payer: BC Managed Care – PPO | Attending: Neurosurgery | Admitting: Neurosurgery

## 2014-05-02 ENCOUNTER — Emergency Department (HOSPITAL_COMMUNITY): Payer: BC Managed Care – PPO | Admitting: Anesthesiology

## 2014-05-02 ENCOUNTER — Emergency Department (HOSPITAL_COMMUNITY): Payer: BC Managed Care – PPO

## 2014-05-02 ENCOUNTER — Encounter (HOSPITAL_COMMUNITY)
Admission: EM | Disposition: A | Discharge status: Home Health | Payer: Self-pay | Source: Home / Self Care | Attending: Neurosurgery

## 2014-05-02 DIAGNOSIS — M549 Dorsalgia, unspecified: Secondary | ICD-10-CM | POA: Diagnosis not present

## 2014-05-02 DIAGNOSIS — Y838 Other surgical procedures as the cause of abnormal reaction of the patient, or of later complication, without mention of misadventure at the time of the procedure: Secondary | ICD-10-CM | POA: Diagnosis present

## 2014-05-02 DIAGNOSIS — M869 Osteomyelitis, unspecified: Secondary | ICD-10-CM | POA: Diagnosis present

## 2014-05-02 DIAGNOSIS — T8140XA Infection following a procedure, unspecified, initial encounter: Secondary | ICD-10-CM | POA: Diagnosis not present

## 2014-05-02 DIAGNOSIS — G061 Intraspinal abscess and granuloma: Secondary | ICD-10-CM | POA: Diagnosis present

## 2014-05-02 DIAGNOSIS — M5126 Other intervertebral disc displacement, lumbar region: Secondary | ICD-10-CM | POA: Diagnosis present

## 2014-05-02 DIAGNOSIS — M519 Unspecified thoracic, thoracolumbar and lumbosacral intervertebral disc disorder: Secondary | ICD-10-CM | POA: Diagnosis present

## 2014-05-02 HISTORY — PX: LAMINECTOMY AND MICRODISCECTOMY LUMBAR SPINE: SHX1913

## 2014-05-02 HISTORY — PX: LUMBAR LAMINECTOMY FOR EPIDURAL ABSCESS: SHX5956

## 2014-05-02 LAB — CBC WITH DIFFERENTIAL/PLATELET
Basophils Absolute: 0 10*3/uL (ref 0.0–0.1)
Basophils Relative: 0 % (ref 0–1)
Eosinophils Absolute: 0.1 10*3/uL (ref 0.0–0.7)
Eosinophils Relative: 1 % (ref 0–5)
HCT: 38.4 % — ABNORMAL LOW (ref 39.0–52.0)
Hemoglobin: 13.4 g/dL (ref 13.0–17.0)
Lymphocytes Relative: 19 % (ref 12–46)
Lymphs Abs: 2 10*3/uL (ref 0.7–4.0)
MCH: 30.4 pg (ref 26.0–34.0)
MCHC: 34.9 g/dL (ref 30.0–36.0)
MCV: 87.1 fL (ref 78.0–100.0)
Monocytes Absolute: 0.8 10*3/uL (ref 0.1–1.0)
Monocytes Relative: 7 % (ref 3–12)
Neutro Abs: 7.6 10*3/uL (ref 1.7–7.7)
Neutrophils Relative %: 73 % (ref 43–77)
Platelets: 363 10*3/uL (ref 150–400)
RBC: 4.41 MIL/uL (ref 4.22–5.81)
RDW: 12.5 % (ref 11.5–15.5)
WBC: 10.5 10*3/uL (ref 4.0–10.5)

## 2014-05-02 LAB — COMPREHENSIVE METABOLIC PANEL
ALT: 26 U/L (ref 0–53)
AST: 24 U/L (ref 0–37)
Albumin: 4 g/dL (ref 3.5–5.2)
Alkaline Phosphatase: 101 U/L (ref 39–117)
Anion gap: 13 (ref 5–15)
BUN: 12 mg/dL (ref 6–23)
CO2: 27 mEq/L (ref 19–32)
Calcium: 9.4 mg/dL (ref 8.4–10.5)
Chloride: 100 mEq/L (ref 96–112)
Creatinine, Ser: 0.87 mg/dL (ref 0.50–1.35)
GFR calc Af Amer: >90 mL/min (ref >90)
GFR calc non Af Amer: >90 mL/min (ref >90)
Glucose, Bld: 133 mg/dL — ABNORMAL HIGH (ref 70–99)
Potassium: 4.4 mEq/L (ref 3.7–5.3)
Sodium: 140 mEq/L (ref 137–147)
Total Bilirubin: 0.2 mg/dL — ABNORMAL LOW (ref 0.3–1.2)
Total Protein: 8.3 g/dL (ref 6.0–8.3)

## 2014-05-02 LAB — GRAM STAIN: Gram Stain: NONE SEEN

## 2014-05-02 LAB — PROTIME-INR
INR: 1.1 (ref 0.00–1.49)
Prothrombin Time: 14.2 seconds (ref 11.6–15.2)

## 2014-05-02 LAB — APTT: aPTT: 33 seconds (ref 24–37)

## 2014-05-02 SURGERY — LUMBAR LAMINECTOMY FOR EPIDURAL ABSCESS
Anesthesia: General | Site: Back

## 2014-05-02 MED ORDER — SUCCINYLCHOLINE CHLORIDE 20 MG/ML IJ SOLN
INTRAMUSCULAR | Status: AC
  Filled 2014-05-02: qty 1

## 2014-05-02 MED ORDER — ACETAMINOPHEN 160 MG/5ML PO SOLN
325.0000 mg | ORAL | Status: DC | PRN
Start: 2014-05-02 — End: 2014-05-02

## 2014-05-02 MED ORDER — ACETAMINOPHEN 325 MG PO TABS: 325.0000 mg | ORAL_TABLET | ORAL | Status: DC | PRN

## 2014-05-02 MED ORDER — ONDANSETRON HCL 4 MG/2ML IJ SOLN
INTRAMUSCULAR | Status: AC
  Filled 2014-05-02: qty 2

## 2014-05-02 MED ORDER — ARTIFICIAL TEARS OP OINT
TOPICAL_OINTMENT | OPHTHALMIC | Status: DC | PRN
  Administered 2014-05-02: 1 via OPHTHALMIC

## 2014-05-02 MED ORDER — VECURONIUM BROMIDE 10 MG IV SOLR
INTRAVENOUS | Status: AC
  Filled 2014-05-02: qty 10

## 2014-05-02 MED ORDER — NEOSTIGMINE METHYLSULFATE 10 MG/10ML IV SOLN
INTRAVENOUS | Status: DC | PRN
  Administered 2014-05-02: 3 mg via INTRAVENOUS

## 2014-05-02 MED ORDER — THROMBIN 20000 UNITS EX SOLR
CUTANEOUS | Status: DC | PRN
  Administered 2014-05-02: 21:00:00 via TOPICAL

## 2014-05-02 MED ORDER — CYCLOBENZAPRINE HCL 10 MG PO TABS
10.0000 mg | ORAL_TABLET | Freq: Three times a day (TID) | ORAL | Status: DC | PRN
  Administered 2014-05-02 – 2014-05-04 (×6): 10 mg via ORAL
  Filled 2014-05-02 (×5): qty 1

## 2014-05-02 MED ORDER — SODIUM CHLORIDE 0.9 % IR SOLN
Status: DC | PRN
  Administered 2014-05-02: 21:00:00

## 2014-05-02 MED ORDER — HYDROMORPHONE HCL PF 1 MG/ML IJ SOLN
0.2500 mg | INTRAMUSCULAR | Status: DC | PRN
  Administered 2014-05-02 (×2): 0.25 mg via INTRAVENOUS

## 2014-05-02 MED ORDER — FENTANYL CITRATE 0.05 MG/ML IJ SOLN
INTRAMUSCULAR | Status: DC | PRN
  Administered 2014-05-02 (×2): 25 ug via INTRAVENOUS
  Administered 2014-05-02 (×2): 100 ug via INTRAVENOUS

## 2014-05-02 MED ORDER — SUCCINYLCHOLINE CHLORIDE 20 MG/ML IJ SOLN
INTRAMUSCULAR | Status: DC | PRN
  Administered 2014-05-02: 100 mg via INTRAVENOUS

## 2014-05-02 MED ORDER — PHENYLEPHRINE 40 MCG/ML (10ML) SYRINGE FOR IV PUSH (FOR BLOOD PRESSURE SUPPORT)
PREFILLED_SYRINGE | INTRAVENOUS | Status: AC
  Filled 2014-05-02: qty 10

## 2014-05-02 MED ORDER — OXYCODONE-ACETAMINOPHEN 5-325 MG PO TABS
1.0000 | ORAL_TABLET | ORAL | Status: DC | PRN
  Administered 2014-05-03 (×2): 1 via ORAL
  Administered 2014-05-03 – 2014-05-06 (×9): 2 via ORAL
  Filled 2014-05-02: qty 2
  Filled 2014-05-02: qty 1
  Filled 2014-05-02 (×2): qty 2
  Filled 2014-05-02 (×2): qty 1
  Filled 2014-05-02 (×2): qty 2
  Filled 2014-05-02 (×2): qty 1

## 2014-05-02 MED ORDER — GLYCOPYRROLATE 0.2 MG/ML IJ SOLN
INTRAMUSCULAR | Status: DC | PRN
  Administered 2014-05-02: 0.4 mg via INTRAVENOUS

## 2014-05-02 MED ORDER — PROPOFOL 10 MG/ML IV BOLUS
INTRAVENOUS | Status: AC
  Filled 2014-05-02: qty 20

## 2014-05-02 MED ORDER — LACTATED RINGERS IV SOLN
INTRAVENOUS | Status: DC | PRN
  Administered 2014-05-02: 20:00:00 via INTRAVENOUS

## 2014-05-02 MED ORDER — CYCLOBENZAPRINE HCL 10 MG PO TABS
ORAL_TABLET | ORAL | Status: AC
  Filled 2014-05-02: qty 1

## 2014-05-02 MED ORDER — NEOSTIGMINE METHYLSULFATE 10 MG/10ML IV SOLN
INTRAVENOUS | Status: AC
  Filled 2014-05-02: qty 1

## 2014-05-02 MED ORDER — EPHEDRINE SULFATE 50 MG/ML IJ SOLN
INTRAMUSCULAR | Status: DC | PRN
  Administered 2014-05-02 (×4): 5 mg via INTRAVENOUS

## 2014-05-02 MED ORDER — PROPOFOL 10 MG/ML IV BOLUS
INTRAVENOUS | Status: DC | PRN
  Administered 2014-05-02: 20 mg via INTRAVENOUS
  Administered 2014-05-02: 180 mg via INTRAVENOUS

## 2014-05-02 MED ORDER — HYDROMORPHONE HCL PF 1 MG/ML IJ SOLN
INTRAMUSCULAR | Status: AC
  Administered 2014-05-02: 0.25 mg via INTRAVENOUS
  Filled 2014-05-02: qty 1

## 2014-05-02 MED ORDER — 0.9 % SODIUM CHLORIDE (POUR BTL) OPTIME
TOPICAL | Status: DC | PRN
  Administered 2014-05-02: 1000 mL

## 2014-05-02 MED ORDER — ARTIFICIAL TEARS OP OINT
TOPICAL_OINTMENT | OPHTHALMIC | Status: AC
  Filled 2014-05-02: qty 3.5

## 2014-05-02 MED ORDER — OXYCODONE-ACETAMINOPHEN 5-325 MG PO TABS
1.0000 | ORAL_TABLET | Freq: Four times a day (QID) | ORAL | Status: DC | PRN
  Administered 2014-05-03 – 2014-05-06 (×3): 1 via ORAL
  Filled 2014-05-02 (×4): qty 2

## 2014-05-02 MED ORDER — ONDANSETRON HCL 4 MG/2ML IJ SOLN
INTRAMUSCULAR | Status: DC | PRN
  Administered 2014-05-02: 4 mg via INTRAVENOUS

## 2014-05-02 MED ORDER — CEFAZOLIN SODIUM-DEXTROSE 2-3 GM-% IV SOLR
INTRAVENOUS | Status: DC | PRN
  Administered 2014-05-02: 2 g via INTRAVENOUS

## 2014-05-02 MED ORDER — VECURONIUM BROMIDE 10 MG IV SOLR
INTRAVENOUS | Status: DC | PRN
  Administered 2014-05-02: 4 mg via INTRAVENOUS
  Administered 2014-05-02: 2 mg via INTRAVENOUS

## 2014-05-02 MED ORDER — PHENYLEPHRINE HCL 10 MG/ML IJ SOLN
INTRAMUSCULAR | Status: DC | PRN
  Administered 2014-05-02: 40 ug via INTRAVENOUS

## 2014-05-02 MED ORDER — OXYCODONE HCL 5 MG PO TABS: 5.0000 mg | ORAL_TABLET | Freq: Once | ORAL | Status: DC | PRN

## 2014-05-02 MED ORDER — LIDOCAINE HCL (CARDIAC) 20 MG/ML IV SOLN
INTRAVENOUS | Status: DC | PRN
  Administered 2014-05-02: 80 mg via INTRAVENOUS

## 2014-05-02 MED ORDER — STERILE WATER FOR INJECTION IJ SOLN
INTRAMUSCULAR | Status: AC
  Filled 2014-05-02: qty 10

## 2014-05-02 MED ORDER — THROMBIN 5000 UNITS EX SOLR
OROMUCOSAL | Status: DC | PRN
  Administered 2014-05-02: 22:00:00 via TOPICAL

## 2014-05-02 MED ORDER — BUPIVACAINE HCL (PF) 0.25 % IJ SOLN
INTRAMUSCULAR | Status: DC | PRN
  Administered 2014-05-02: 6 mL

## 2014-05-02 MED ORDER — GLYCOPYRROLATE 0.2 MG/ML IJ SOLN
INTRAMUSCULAR | Status: AC
  Filled 2014-05-02: qty 1

## 2014-05-02 MED ORDER — OXYCODONE HCL 5 MG/5ML PO SOLN: 5.0000 mg | Freq: Once | ORAL | Status: DC | PRN

## 2014-05-02 MED ORDER — FENTANYL CITRATE 0.05 MG/ML IJ SOLN
INTRAMUSCULAR | Status: AC
  Filled 2014-05-02: qty 5

## 2014-05-02 SURGICAL SUPPLY — 32 items
BENZOIN TINCTURE PRP APPL 2/3 (GAUZE/BANDAGES/DRESSINGS) ×2 IMPLANT
BUR MATCHSTICK NEURO 3.0X3.8 (BURR) ×2 IMPLANT
CLOTH BEACON ORANGE TIMEOUT ST (SAFETY) ×2 IMPLANT
CONT SPEC STER OR (MISCELLANEOUS) ×2 IMPLANT
DERMABOND ADVANCED (GAUZE/BANDAGES/DRESSINGS) ×1
DERMABOND ADVANCED .7 DNX12 (GAUZE/BANDAGES/DRESSINGS) ×1 IMPLANT
DRAPE LAPAROTOMY 100X72X124 (DRAPES) ×2 IMPLANT
DRAPE MICROSCOPE LEICA (MISCELLANEOUS) ×2 IMPLANT
DRSG OPSITE 4X5.5 SM (GAUZE/BANDAGES/DRESSINGS) ×2 IMPLANT
DRSG OPSITE POSTOP 3X4 (GAUZE/BANDAGES/DRESSINGS) ×2 IMPLANT
GAUZE SPONGE 4X4 12PLY STRL (GAUZE/BANDAGES/DRESSINGS) ×2 IMPLANT
GLOVE BIO SURGEON STRL SZ 6.5 (GLOVE) ×4 IMPLANT
GLOVE BIO SURGEON STRL SZ7 (GLOVE) ×2 IMPLANT
GLOVE INDICATOR 6.5 STRL GRN (GLOVE) ×2 IMPLANT
GOWN STRL REUS W/ TWL LRG LVL3 (GOWN DISPOSABLE) ×2 IMPLANT
GOWN STRL REUS W/TWL LRG LVL3 (GOWN DISPOSABLE) ×4 IMPLANT
KIT BASIN OR (CUSTOM PROCEDURE TRAY) ×2 IMPLANT
KIT ROOM TURNOVER OR (KITS) ×2 IMPLANT
NEEDLE HYPO 22GX1.5 SAFETY (NEEDLE) ×2 IMPLANT
PACK LAMINECTOMY NEURO (CUSTOM PROCEDURE TRAY) ×2 IMPLANT
RUBBERBAND STERILE (MISCELLANEOUS) ×4 IMPLANT
SUT VIC AB 0 CT1 27 (SUTURE) ×2
SUT VIC AB 0 CT1 27XBRD ANBCTR (SUTURE) ×2 IMPLANT
SUT VIC AB 2-0 CT1 27 (SUTURE) ×1
SUT VIC AB 2-0 CT1 27XBRD (SUTURE) ×1 IMPLANT
SUT VIC AB 3-0 SH 18 (SUTURE) ×2 IMPLANT
SUT VICRYL 4-0 PS2 18IN ABS (SUTURE) ×2 IMPLANT
SWAB CULTURE LIQ STUART DBL (MISCELLANEOUS) ×8 IMPLANT
TAPE STRIPS DRAPE STRL (GAUZE/BANDAGES/DRESSINGS) ×2 IMPLANT
TOWEL OR 17X24 6PK STRL BLUE (TOWEL DISPOSABLE) ×2 IMPLANT
TOWEL OR 17X26 10 PK STRL BLUE (TOWEL DISPOSABLE) ×2 IMPLANT
TUBE ANAEROBIC SPECIMEN COL (MISCELLANEOUS) ×8 IMPLANT

## 2014-05-02 NOTE — ED Notes (Signed)
Patient taken up to the OR.

## 2014-05-02 NOTE — Transfer of Care (Signed)
Immediate Anesthesia Transfer of Care Note  Patient: Brian [name]  Procedure(s) Performed: Procedure(s): LUMBAR LAMINECTOMY FOR EPIDURAL ABSCESS, lumbar four-five right (N/A)  Patient Location: PACU  Anesthesia Type:General  Level of Consciousness: responds to stimulation  Airway & Oxygen Therapy: Patient Spontanous Breathing and Patient connected to nasal cannula oxygen  Post-op Assessment: Report given to PACU RN, Post -op Vital signs reviewed and stable and Patient moving all extremities  Post vital signs: Reviewed and stable  Complications: No apparent anesthesia complications

## 2014-05-02 NOTE — ED Notes (Signed)
Patient here at request of neuro surgeon. Explains that he had an MRI done today to evaluate for surgical infection. Was called back quickly by physician and told to present to ED immediately. States that he had lower spine surgery done on July 10th and since then he has been having trouble with pain in his lower back. MD reported to patient that he has "bone infection"

## 2014-05-02 NOTE — H&P (Signed)
Brian [name] is an 48 y.o. male.   Chief Complaint: Back and right leg pain HPI: Patient is a very pleasant 48 year old gentleman who's had progressive worsening back and right hip and leg pain, last month has gotten acutely worse over last few days. He says it radiates to his hip anterior quad down the front of his shin consistent with sounds like an L4 nerve root pattern. He denies any left leg symptoms denies any bowel bladder complaints. As an outpatient he obtained an MRI scan the data showed a significant amount of enhancement in the vertebral bodies at L4 and L5 and it as well as extensive amount of enhancing phlegmon in the epidural space causing severe stenosis and compression of both the L4 and L5 nerve roots. Due to patient's failed conservative treatment imaging findings and suspicion of infection dural abscess or osteomyelitis I recommended rex-rays of his lumbar wound obtaining cultures and decompressed the thecal sac. We extensively reviewed the risks and benefits of the operation as well as perioperative course expectations of outcome and alternatives of surgery and he understands and agrees to proceed forward.  Past Medical History  Diagnosis Date  . Hyperlipidemia 07-18-11    tx. with meds(high triglycerides)  . GERD (gastroesophageal reflux disease) 07-18-11    hx. reflux.  . Ventral hernia 07-18-11    surgery planned 07-27-11    Past Surgical History  Procedure Laterality Date  . Hernia repair  08/03/2011  . Shoulder surgery  07/1988    07-18-11  -left shoulder fracture repair  . Wrist surgery  07/1988    07-18-11 left with retained hardware  . Back surgery  10/2001    Microdisc.  . Adenoidectomy  07-18-11    as a child  . Ventral hernia repair  07-18-11    hx.   . Ventral hernia repair  07/27/2011    Procedure: LAPAROSCOPIC VENTRAL HERNIA;  Surgeon: Mariella Saa, MD;  Location: [facility];  Service: General;  Laterality: N/A;    Family History  Problem  Relation Age of Onset  . Heart disease Father   . Heart disease Paternal Uncle    Social History:  reports that he has never smoked. He has never used smokeless tobacco. He reports that he does not drink alcohol or use illicit drugs.  Allergies: No Known Allergies   (Not in a hospital admission)  Results for orders placed during the hospital encounter of 05/02/14 (from the past 48 hour(s))  CBC WITH DIFFERENTIAL     Status: Abnormal   Collection Time    05/02/14  7:29 PM      Result Value Ref Range   WBC 10.5  4.0 - 10.5 K/uL   RBC 4.41  4.22 - 5.81 MIL/uL   Hemoglobin 13.4  13.0 - 17.0 g/dL   HCT 19.1 (*) 47.8 - 29.5 %   MCV 87.1  78.0 - 100.0 fL   MCH 30.4  26.0 - 34.0 pg   MCHC 34.9  30.0 - 36.0 g/dL   RDW 62.1  30.8 - 65.7 %   Platelets 363  150 - 400 K/uL   Neutrophils Relative % 73  43 - 77 %   Neutro Abs 7.6  1.7 - 7.7 K/uL   Lymphocytes Relative 19  12 - 46 %   Lymphs Abs 2.0  0.7 - 4.0 K/uL   Monocytes Relative 7  3 - 12 %   Monocytes Absolute 0.8  0.1 - 1.0 K/uL   Eosinophils Relative 1  0 - 5 %   Eosinophils Absolute 0.1  0.0 - 0.7 K/uL   Basophils Relative 0  0 - 1 %   Basophils Absolute 0.0  0.0 - 0.1 K/uL   No results found.  Review of Systems  Constitutional: Negative.   HENT: Negative.   Eyes: Negative.   Respiratory: Negative.   Cardiovascular: Negative.   Gastrointestinal: Negative.   Genitourinary: Negative.   Musculoskeletal: Positive for back pain and joint pain.  Skin: Negative.   Neurological: Positive for sensory change.  Psychiatric/Behavioral: Negative.     Blood pressure 149/94, pulse 110, temperature 97.6 F (36.4 C), temperature source Oral, resp. rate 98, height 6' (1.829 m), weight 90.719 kg (200 lb), SpO2 96.00%. Physical Exam  Constitutional: He is oriented to person, place, and time. He appears well-developed and well-nourished.  HENT:  Head: Normocephalic.  Eyes: Pupils are equal, round, and reactive to light.  Neck: Normal  range of motion.  Respiratory: Effort normal.  GI: Soft. Bowel sounds are normal.  Neurological: He is alert and oriented to person, place, and time. He has normal strength. GCS eye subscore is 4. GCS verbal subscore is 5. GCS motor subscore is 6.  Patient exam in the left lower extremities is 5 out of 5 in his iliopsoas, quads, hamstrings gastroc EHL. And the right lower extremity he has 4+ out of 5 weakness of his iliopsoas and quadriceps distally is 5 out of 5  Skin: Skin is warm and dry.     Assessment/Plan 48 years and presents for reexploration of lumbar wound evacuation of possible epidural abscess  Dinia Joynt P 05/02/2014, 7:48 PM

## 2014-05-02 NOTE — ED Notes (Signed)
Patient denies loss of bowel or bladder control, but endorses paresthesia in both legs.

## 2014-05-02 NOTE — Anesthesia Procedure Notes (Addendum)
Procedure Name: Intubation Date/Time: 05/02/2014 8:54 PM Performed by: Luster Landsberg Pre-anesthesia Checklist: Patient identified, Emergency Drugs available, Suction available and Patient being monitored Patient Re-evaluated:Patient Re-evaluated prior to inductionOxygen Delivery Method: Circle system utilized Preoxygenation: Pre-oxygenation with 100% oxygen Intubation Type: IV induction, Rapid sequence and Cricoid Pressure applied Laryngoscope Size: Mac and 3 Grade View: Grade I Tube type: Oral Tube size: 7.5 mm Number of attempts: 1 Airway Equipment and Method: Stylet Placement Confirmation: ETT inserted through vocal cords under direct vision,  positive ETCO2 and breath sounds checked- equal and bilateral Secured at: 22 cm Tube secured with: Tape Dental Injury: Teeth and Oropharynx as per pre-operative assessment

## 2014-05-02 NOTE — Anesthesia Preprocedure Evaluation (Addendum)
Anesthesia Evaluation  Patient identified by MRN, date of birth, ID band Patient awake    Airway Mallampati: II TM Distance: >3 FB Neck ROM: Full    Dental  (+) Teeth Intact, Dental Advisory Given   Pulmonary neg pulmonary ROS,          Cardiovascular negative cardio ROS  Rhythm:Regular     Neuro/Psych Epidural enhancement ? Abscess post back surgery negative psych ROS   GI/Hepatic Neg liver ROS, GERD-  Medicated and Controlled,  Endo/Other  negative endocrine ROS  Renal/GU      Musculoskeletal   Abdominal   Peds  Hematology negative hematology ROS (+)   Anesthesia Other Findings   Reproductive/Obstetrics                          Anesthesia Physical Anesthesia Plan  ASA: II and emergent  Anesthesia Plan: General   Post-op Pain Management:    Induction: Intravenous  Airway Management Planned: Oral ETT  Additional Equipment: None  Intra-op Plan:   Post-operative Plan: Extubation in OR  Informed Consent: I have reviewed the patients History and Physical, chart, labs and discussed the procedure including the risks, benefits and alternatives for the proposed anesthesia with the patient or authorized representative who has indicated his/her understanding and acceptance.   Dental advisory given  Plan Discussed with: CRNA, Anesthesiologist and Surgeon  Anesthesia Plan Comments:        Anesthesia Quick Evaluation

## 2014-05-02 NOTE — ED Provider Notes (Signed)
I discussed the patient with neurosurgery prior to examining the patient. They state that they are seen patient he's not need to be evaluated by the ED staff. They're requesting an IV and for him to go straight to the OR for a postop abscess in the spine. At this time patient's vital signs are stable and will leave the dispo to the neurosurgery team.  Audree Camel, MD 05/02/14 [phone]

## 2014-05-02 NOTE — Op Note (Signed)
Preoperative diagnosis: Right L4 and L5 radiculopathy from possible lumbar epidural abscess and osteomyelitis and discitis  Postoperative diagnosis: Same along with recurrent disc herniation  Procedure: redo laminectomy at L4-5 redo discectomy L4-5 with microdissection of both the right L5 and L4 nerve roots with microscopic foraminotomies of both nerve roots and aspiration and cultures of lumbar epidural fluid collection  Surgeon: Jillyn Hidden Deandra Goering  Anesthesia: Gen.  EBL: Minimal  History of present illness: Patient is a 48 year old gentleman who 2 months ago previously undergone a right-sided L4-5 laminectomy discectomy initially did very well however over last several weeks has had progressive worsening back right hip and leg pain refractory anti-inflammatories and time patient received an outpatient MRI scan that was highly suspicious for osteomyelitis discitis and lumbar epidural abscess the patient was emergently brought to the ER and taken to the OR for reexploration of lumbar wound redo L4-5 laminectomy and discectomy with aspiration in and out of fluid and cultures.  I extensively the risks and benefits of the procedure the patient as well as perioperative course and expectations of outcome and alternatives of surgery he understands and agrees to proceed forward.  Operative procedure: Patient brought into the or was induced and general anesthesia was proposed frame his back was prepped and draped in routine sterile fashion his old incision was opened up and the scar tissues dissected off of the residual lamina of L4-5 interoperative x-ray confirmed this to be of the appropriate level there was a dense amount of epidural fibrosis and scar tissue that was removed as soon as open the skin there was some purulent-looking fluid I aspirated and sent for culture I also sent some additional cultures in the subfascial space as well as in the epidural space. After I identified the laminotomy defects I  extended laminotomy of the through L4 under been some additional medial facet complex and extended the L5 lamina down identified the L5 nerve root the L5 pedicle. I microscopic illumination the L5 pedicle was used to dissect the L5 nerve off the L5 pedicle working from this plane up underneath in the disc space there was a dense amount of necrotic material and a disc material it recurred and reruptured into and underneath the L4 and L5 nerve roots. I cleaned out the disc space and extended cephalad and worked up underneath the dura and up to the level of the L4 pedicle and up underneath the L4 nerve root. I removed some disc material that was densely necrotic friable also were removed dense amount of phlegmon. At the end of the redo discectomy the L4 foramen and L5 from a widely patent the nerve was visualized and clearly decompressed. There is no further purulent fluid I copiously irrigated the wound meticulous hemostasis was maintained Gelfoam was overlaid top of the dura a drain was placed and the wounds closed in layers with after Vicryl the skin was) 4 subcuticular. I did take cultures from within the disc space is well. And then the wound was dressed with Dermabond benzo and Steri-Strips 4 x 4's and OpSite.

## 2014-05-03 LAB — C-REACTIVE PROTEIN: CRP: 2.3 mg/dL — ABNORMAL HIGH (ref <0.60)

## 2014-05-03 LAB — SEDIMENTATION RATE: Sed Rate: 50 mm/hr — ABNORMAL HIGH (ref 0–16)

## 2014-05-03 MED ORDER — SODIUM CHLORIDE 0.9 % IV SOLN
250.0000 mL | INTRAVENOUS | Status: DC
  Administered 2014-05-03 – 2014-05-04 (×2): 250 mL via INTRAVENOUS

## 2014-05-03 MED ORDER — ASPIRIN 325 MG PO TABS
325.0000 mg | ORAL_TABLET | Freq: Every day | ORAL | Status: DC
  Administered 2014-05-03 – 2014-05-06 (×4): 325 mg via ORAL
  Filled 2014-05-03 (×4): qty 1

## 2014-05-03 MED ORDER — KETOROLAC TROMETHAMINE 30 MG/ML IJ SOLN
30.0000 mg | Freq: Three times a day (TID) | INTRAMUSCULAR | Status: AC
  Administered 2014-05-03 – 2014-05-04 (×3): 30 mg via INTRAVENOUS
  Filled 2014-05-03 (×3): qty 1

## 2014-05-03 MED ORDER — HYDROCODONE-ACETAMINOPHEN 5-325 MG PO TABS
1.0000 | ORAL_TABLET | Freq: Four times a day (QID) | ORAL | Status: DC | PRN
  Administered 2014-05-04 – 2014-05-05 (×3): 1 via ORAL
  Filled 2014-05-03 (×3): qty 1

## 2014-05-03 MED ORDER — DEXTROSE 5 % IV SOLN
2.0000 g | Freq: Every day | INTRAVENOUS | Status: DC
  Administered 2014-05-03 – 2014-05-05 (×4): 2 g via INTRAVENOUS
  Filled 2014-05-03 (×5): qty 2

## 2014-05-03 MED ORDER — ACETAMINOPHEN 325 MG PO TABS: 650.0000 mg | ORAL_TABLET | ORAL | Status: DC | PRN

## 2014-05-03 MED ORDER — PHENOL 1.4 % MT LIQD: 1.0000 | OROMUCOSAL | Status: DC | PRN

## 2014-05-03 MED ORDER — VANCOMYCIN HCL IN DEXTROSE 1-5 GM/200ML-% IV SOLN
1000.0000 mg | Freq: Three times a day (TID) | INTRAVENOUS | Status: DC
  Administered 2014-05-03 – 2014-05-06 (×11): 1000 mg via INTRAVENOUS
  Filled 2014-05-03 (×12): qty 200

## 2014-05-03 MED ORDER — PANTOPRAZOLE SODIUM 40 MG PO TBEC
40.0000 mg | DELAYED_RELEASE_TABLET | Freq: Every day | ORAL | Status: DC
  Administered 2014-05-03 – 2014-05-06 (×4): 40 mg via ORAL
  Filled 2014-05-03 (×4): qty 1

## 2014-05-03 MED ORDER — SODIUM CHLORIDE 0.9 % IJ SOLN: 3.0000 mL | INTRAMUSCULAR | Status: DC | PRN

## 2014-05-03 MED ORDER — ACETAMINOPHEN 650 MG RE SUPP: 650.0000 mg | RECTAL | Status: DC | PRN

## 2014-05-03 MED ORDER — MENTHOL 3 MG MT LOZG: 1.0000 | LOZENGE | OROMUCOSAL | Status: DC | PRN

## 2014-05-03 MED ORDER — SODIUM CHLORIDE 0.9 % IJ SOLN
3.0000 mL | Freq: Two times a day (BID) | INTRAMUSCULAR | Status: DC
  Administered 2014-05-03 – 2014-05-05 (×3): 3 mL via INTRAVENOUS

## 2014-05-03 MED ORDER — ONDANSETRON HCL 4 MG/2ML IJ SOLN: 4.0000 mg | INTRAMUSCULAR | Status: DC | PRN

## 2014-05-03 MED ORDER — DOCUSATE SODIUM 100 MG PO CAPS
100.0000 mg | ORAL_CAPSULE | Freq: Two times a day (BID) | ORAL | Status: DC
  Administered 2014-05-03 – 2014-05-06 (×7): 100 mg via ORAL
  Filled 2014-05-03 (×11): qty 1

## 2014-05-03 MED ORDER — IBUPROFEN 800 MG PO TABS
800.0000 mg | ORAL_TABLET | Freq: Four times a day (QID) | ORAL | Status: DC | PRN
Start: 2014-05-03 — End: 2014-05-03
  Filled 2014-05-03: qty 1

## 2014-05-03 MED ORDER — HYDROMORPHONE HCL PF 1 MG/ML IJ SOLN
0.5000 mg | INTRAMUSCULAR | Status: DC | PRN
  Administered 2014-05-03 – 2014-05-05 (×6): 1 mg via INTRAVENOUS
  Filled 2014-05-03 (×6): qty 1

## 2014-05-03 NOTE — Progress Notes (Signed)
Subjective: Patient reports overall hes dong better, significant improvement in strength but has persistant ant quad pain.    Objective: Vital signs in last 24 hours: Temp:  [97.6 F (36.4 C)-98.1 F (36.7 C)] 97.9 F (36.6 C) (09/05 0546) Pulse Rate:  [67-110] 88 (09/05 0546) Resp:  [8-98] 18 (09/05 0546) BP: (124-152)/(67-99) 124/67 mmHg (09/05 0546) SpO2:  [96 %-100 %] 99 % (09/05 0546) Weight:  [90.719 kg (200 lb)] 90.719 kg (200 lb) (09/04 1920)  Intake/Output from previous day: 09/04 0701 - 09/05 0700 In: 800 [I.V.:800] Out: 950 [Urine:850; Blood:100] Intake/Output this shift:    str 5/5, incision C/D/I  Lab Results:  Recent Labs  05/02/14 1929  WBC 10.5  HGB 13.4  HCT 38.4*  PLT 363   BMET  Recent Labs  05/02/14 1929  NA 140  K 4.4  CL 100  CO2 27  GLUCOSE 133*  BUN 12  CREATININE 0.87  CALCIUM 9.4    Studies/Results: Dg Lumbar Spine 1 View  05/02/2014   CLINICAL DATA:  Re-exploration of lumbar wound for epidural abscess.  EXAM: LUMBAR SPINE - 1 VIEW  COMPARISON:  None.  FINDINGS: Single cross-table lateral portable view of the lumbar spine is obtained for surgical localization purposes. The metallic localization marker is projected over the posterior elements of L5, superiorly and at the level of the L5 endplate. Sponge marker is present. Degenerative changes are present in the lumbar spine.  IMPRESSION: Localization marker projected over the posterior elements of L5, posterior to the superior endplate.   Electronically Signed   By: Burman Nieves M.D.   On: 05/02/2014 22:25    Assessment/Plan: PT, toradol, await cultures   LOS: 1 day     Jonay Hitchcock P 05/03/2014, 9:49 AM

## 2014-05-03 NOTE — Anesthesia Postprocedure Evaluation (Signed)
  Anesthesia Post-op Note  Patient: Brian [name]  Procedure(s) Performed: Procedure(s): LUMBAR LAMINECTOMY FOR EPIDURAL ABSCESS, lumbar four-five right (N/A)  Patient Location: PACU  Anesthesia Type:General  Level of Consciousness: awake  Airway and Oxygen Therapy: Patient Spontanous Breathing  Post-op Pain: mild  Post-op Assessment: Post-op Vital signs reviewed, Patient's Cardiovascular Status Stable, Respiratory Function Stable, Patent Airway, No signs of Nausea or vomiting and Pain level controlled  Post-op Vital Signs: Reviewed and stable  Last Vitals:  Filed Vitals:   05/03/14 1245  BP: 125/77  Pulse: 83  Temp: 36.7 C  Resp: 16    Complications: No apparent anesthesia complications

## 2014-05-03 NOTE — Progress Notes (Signed)
Pt is drowsy on admission, admission data to be completed when patient awakens more, wife went home to rest.

## 2014-05-03 NOTE — Progress Notes (Signed)
ANTIBIOTIC CONSULT NOTE - INITIAL  Pharmacy Consult for Vancomycin Indication: post-op prophylaxis; possible lumbar epidural abscess  No Known Allergies  Patient Measurements: Height: 6' (182.9 cm) Weight: 200 lb (90.719 kg) IBW/kg (Calculated) : 77.6  Vital Signs: Temp: 98.1 F (36.7 C) (09/04 2354) Temp src: Oral (09/04 2354) BP: 141/78 mmHg (09/04 2354) Pulse Rate: 82 (09/04 2354) Intake/Output from previous day: 09/04 0701 - 09/05 0700 In: 800 [I.V.:800] Out: 100 [Blood:100] Intake/Output from this shift: Total I/O In: 800 [I.V.:800] Out: 100 [Blood:100]  Labs:  Recent Labs  05/02/14 1929  WBC 10.5  HGB 13.4  PLT 363  CREATININE 0.87   Estimated Creatinine Clearance: 112.7 ml/min (by C-G formula based on Cr of 0.87). No results found for this basename: VANCOTROUGH, VANCOPEAK, VANCORANDOM, GENTTROUGH, GENTPEAK, GENTRANDOM, TOBRATROUGH, TOBRAPEAK, TOBRARND, AMIKACINPEAK, AMIKACINTROU, AMIKACIN,  in the last 72 hours   Microbiology: Recent Results (from the past 720 hour(s))  GRAM STAIN     Status: None   Collection Time    05/02/14  9:15 PM      Result Value Ref Range Status   Specimen Description WOUND BACK   Final   Special Requests SUPERFICIAL LUMBAR WOUND SPECIMEN NO 1   Final   Gram Stain     Final   Value: RARE WBC PRESENT, PREDOMINANTLY MONONUCLEAR     NO ORGANISMS SEEN   Report Status 05/02/2014 FINAL   Final  GRAM STAIN     Status: None   Collection Time    05/02/14  9:21 PM      Result Value Ref Range Status   Specimen Description WOUND BACK   Final   Special Requests DEEP SPACE LUMBAR WOUND SPECIMEN NO 2   Final   Gram Stain     Final   Value: RARE WBC PRESENT,BOTH PMN AND MONONUCLEAR     NO ORGANISMS SEEN   Report Status 05/02/2014 FINAL   Final  GRAM STAIN     Status: None   Collection Time    05/02/14  9:23 PM      Result Value Ref Range Status   Specimen Description ABSCESS BACK   Final   Special Requests EPIDURAL ABSCESS SPECIMEN NO  3   Final   Gram Stain     Final   Value: RARE WBC PRESENT, PREDOMINANTLY MONONUCLEAR     NO ORGANISMS SEEN   Report Status 05/02/2014 FINAL   Final  GRAM STAIN     Status: None   Collection Time    05/02/14 10:03 PM      Result Value Ref Range Status   Specimen Description ABSCESS BACK   Final   Special Requests DISC SPACE SPECIMEN NO 4   Final   Gram Stain     Final   Value: NO WBC SEEN     NO ORGANISMS SEEN   Report Status 05/02/2014 FINAL   Final    Medical History: Past Medical History  Diagnosis Date  . Hyperlipidemia 07-18-11    tx. with meds(high triglycerides)  . GERD (gastroesophageal reflux disease) 07-18-11    hx. reflux.  . Ventral hernia 07-18-11    surgery planned 07-27-11    Medications:  Prescriptions prior to admission  Medication Sig Dispense Refill  . aspirin 325 MG tablet Take 325 mg by mouth daily. PAIN       . HYDROcodone-acetaminophen (NORCO/VICODIN) 5-325 MG per tablet Take 1 tablet by mouth every 6 (six) hours as needed for moderate pain.      Marland Kitchen  ibuprofen (ADVIL,MOTRIN) 200 MG tablet Take 800 mg by mouth every 6 (six) hours as needed for moderate pain.       Marland Kitchen MAGNESIUM CHLORIDE-CALCIUM PO Take 1 tablet by mouth daily.      . methylPREDNISolone (MEDROL DOSEPAK) 4 MG tablet Take 4-24 mg by mouth daily. follow package directions      . omeprazole (PRILOSEC) 20 MG capsule Take 20 mg by mouth at bedtime.       Marland Kitchen oxyCODONE-acetaminophen (PERCOCET/ROXICET) 5-325 MG per tablet Take 1-2 tablets by mouth every 6 (six) hours as needed for severe pain.       Assessment: 48 y.o. male s/p OR 9/5 for redo laminectomy/discectomy L4-5 and aspiration of lumbar epidural fluid collection. Pt with hemovac drain in place post-op. Pt received Ancef 2gm pre-op ~2130. To start Vancomycin for post-op prophylaxis and for possible lumbar epidural abscess. Estimated CrCl >100 ml/min.  Goal of Therapy:  Vancomycin trough level 15-20 mcg/ml  Plan:  1) Vancomycin 1gm IV  q8h 2) Will f/u renal function, micro data, pt's clinical condition 3) Vanc trough prn  Christoper Fabian, PharmD, BCPS Clinical pharmacist, pager [phone] 05/03/2014,12:24 AM

## 2014-05-04 MED ORDER — GABAPENTIN 100 MG PO CAPS
100.0000 mg | ORAL_CAPSULE | Freq: Three times a day (TID) | ORAL | Status: AC
  Administered 2014-05-04 – 2014-05-06 (×6): 100 mg via ORAL
  Filled 2014-05-04 (×6): qty 1

## 2014-05-04 MED ORDER — GABAPENTIN 300 MG PO CAPS: 300.0000 mg | ORAL_CAPSULE | Freq: Three times a day (TID) | ORAL | Status: DC

## 2014-05-04 MED ORDER — GABAPENTIN 100 MG PO CAPS
200.0000 mg | ORAL_CAPSULE | Freq: Three times a day (TID) | ORAL | Status: DC
  Administered 2014-05-06: 200 mg via ORAL
  Filled 2014-05-04 (×2): qty 2

## 2014-05-04 MED ORDER — SODIUM CHLORIDE 0.9 % IJ SOLN
10.0000 mL | INTRAMUSCULAR | Status: DC | PRN
  Administered 2014-05-06: 10 mL

## 2014-05-04 NOTE — Evaluation (Signed)
Physical Therapy Evaluation Patient Details Name: Brian [name] MRN: [id number] DOB: February 22, 1966 Today's Date: 05/04/2014   History of Present Illness  48 y.o. male s/p LUMBAR LAMINECTOMY FOR EPIDURAL ABSCESS, lumbar four-five right .  Clinical Impression  Patient is seen following the above procedure and presents with functional limitations due to the deficits listed below (see PT Problem List). Motivated to improve and work with therapy however, abilities limited by pain this afternoon. Complains of severe right hip pain radiating into anterior thigh with no back pain. Ambulates up to 175 feet with close guard while using a rolling walker but no instance of buckling or loss of balance. Plan for stair training in AM as pt was in too much pain to complete today. Patient will benefit from skilled PT to increase their independence and safety with mobility to allow discharge to the venue listed below.      Follow Up Recommendations No PT follow up    Equipment Recommendations  Other (comment) (TBD)    Recommendations for Other Services OT consult     Precautions / Restrictions Precautions Precautions: Back;Fall Precaution Comments: Reviewed back precautions Restrictions Weight Bearing Restrictions: No      Mobility  Bed Mobility Overal bed mobility: Needs Assistance Bed Mobility: Rolling;Sidelying to Sit Rolling: Supervision Sidelying to sit: Supervision       General bed mobility comments: Supervision for safety with VC and education for log roll technique. Able to perform without assist.  Transfers Overall transfer level: Needs assistance Equipment used: Rolling walker (2 wheeled) Transfers: Sit to/from Stand Sit to Stand: Supervision         General transfer comment: Supervision for safety with VC for hand placement and technique. Poor weight-bearing through RLE initially  Ambulation/Gait Ambulation/Gait assistance: Min guard Ambulation Distance (Feet): 175  Feet Assistive device: Rolling walker (2 wheeled) Gait Pattern/deviations: Step-through pattern;Decreased stride length;Decreased stance time - right   Gait velocity interpretation: Below normal speed for age/gender General Gait Details: Educated on safe DME use with rolling walker. VC for increasing weight-bearing through RLE for symmetry of gait. Required several standing rest breaks to flex Rt knee due to pain. No loss of balance to buckling noted.  Stairs            Wheelchair Mobility    Modified Rankin (Stroke Patients Only)       Balance Overall balance assessment: Needs assistance Sitting-balance support: No upper extremity supported;Feet supported Sitting balance-Leahy Scale: Good     Standing balance support: No upper extremity supported Standing balance-Leahy Scale: Fair                               Pertinent Vitals/Pain Pain Assessment: 0-10 Pain Score: 10-Worst pain ever Pain Location: Rt hip and thigh Pain Descriptors / Indicators: Burning Pain Intervention(s): Limited activity within patient's tolerance;Monitored during session;Repositioned;RN gave pain meds during session    Home Living Family/patient expects to be discharged to:: Private residence Living Arrangements: Spouse/significant other;Children Available Help at Discharge: Family Type of Home: House Home Access: Stairs to enter   Secretary/administrator of Steps: 5          Prior Function Level of Independence: Independent               Hand Dominance        Extremity/Trunk Assessment   Upper Extremity Assessment: Defer to OT evaluation           Lower  Extremity Assessment: RLE deficits/detail RLE Deficits / Details: MMT: hip flexion 4/5, knee extension 4/5, knee flexion 4/5, ankle dorsiflexion and great toe extension 5/5. Normal light touch sensation. Reports significant burning sensation in Rt hip through anterior thigh.       Communication    Communication: No difficulties  Cognition Arousal/Alertness: Awake/alert Behavior During Therapy: WFL for tasks assessed/performed Overall Cognitive Status: Within Functional Limits for tasks assessed                      General Comments General comments (skin integrity, edema, etc.): Complains of significant pain in Rt hip and anterior thigh. Pt unable to tolerate upright sitting position for long, prefers to stand or recline back with feet elevated. Reviewed back precautions and safe positioning and mobility techniques for neutral back alignment and comfort.     Exercises        Assessment/Plan    PT Assessment Patient needs continued PT services  PT Diagnosis Abnormality of gait;Difficulty walking;Acute pain   PT Problem List Decreased strength;Decreased range of motion;Decreased activity tolerance;Decreased balance;Decreased mobility;Decreased knowledge of use of DME;Decreased knowledge of precautions;Pain  PT Treatment Interventions DME instruction;Gait training;Stair training;Functional mobility training;Therapeutic activities;Therapeutic exercise;Balance training;Neuromuscular re-education;Patient/family education;Modalities   PT Goals (Current goals can be found in the Care Plan section) Acute Rehab PT Goals Patient Stated Goal: No pain PT Goal Formulation: With patient Time For Goal Achievement: 05/11/14 Potential to Achieve Goals: Good    Frequency Min 5X/week   Barriers to discharge        Co-evaluation               End of Session   Activity Tolerance: Patient limited by pain Patient left: in chair;with call bell/phone within reach;with family/visitor present Nurse Communication: Mobility status         Time: 1478-2956 PT Time Calculation (min): 30 min   Charges:   PT Evaluation $Initial PT Evaluation Tier I: 1 Procedure PT Treatments $Gait Training: 8-22 mins   PT G Codes:         Charlsie Merles,  [phone]  Berton Mount 05/04/2014, 4:17 PM

## 2014-05-04 NOTE — Progress Notes (Signed)
Peripherally Inserted Central Catheter/Midline Placement  The IV Nurse has discussed with the patient and/or persons authorized to consent for the patient, the purpose of this procedure and the potential benefits and risks involved with this procedure.  The benefits include less needle sticks, lab draws from the catheter and patient may be discharged home with the catheter.  Risks include, but not limited to, infection, bleeding, blood clot (thrombus formation), and puncture of an artery; nerve damage and irregular heat beat.  Alternatives to this procedure were also discussed.  Consent obtained by Chari Manning, RN.   PICC/Midline Placement Documentation        Charish Schroepfer, Lajean Manes 05/04/2014, 9:08 AM

## 2014-05-04 NOTE — Progress Notes (Signed)
Patient has complaints of right thigh and right buttock pain. Patient states that after physical therapy today he noticed that his right leg began to hurt and get progressively worse throughout the day. The patient states that he believes that he has sciatica and that the "nerve" has not been cleaned out properly during surgery. The patient is squirming and rocking back and forth in the bed. The patient states that he has no back pain from the surgery at all. The RN informed the patient that she will attempt to make the patient as comfortable as possible by giving pain medications throughout the night. The patient states that he does not want to "throw" pain medications at the pain but he wants whatever it is that is causing him pain to be "fixed". Patients wife and two younger children at the bedside. Patient seems anxious and very uncomfortable. RN will continue to monitor and reassess the patient throughout the night.

## 2014-05-04 NOTE — Progress Notes (Signed)
Subjective: Patient reports mmuch better this morning significant proven leg pain so a cemented quad pain but a good nice rest  Objective: Vital signs in last 24 hours: Temp:  [98 F (36.7 C)-98.1 F (36.7 C)] 98.1 F (36.7 C) (09/06 0509) Pulse Rate:  [74-84] 74 (09/06 0509) Resp:  [16] 16 (09/06 0509) BP: (125-128)/(69-77) 128/76 mmHg (09/06 0509) SpO2:  [95 %-100 %] 99 % (09/06 0509)  Intake/Output from previous day: 09/05 0701 - 09/06 0700 In: 840 [P.O.:840] Out: 2300 [Urine:2300] Intake/Output this shift:    strength out of 5 wound clean dry and intact  Lab Results:  Recent Labs  05/02/14 1929  WBC 10.5  HGB 13.4  HCT 38.4*  PLT 363   BMET  Recent Labs  05/02/14 1929  NA 140  K 4.4  CL 100  CO2 27  GLUCOSE 133*  BUN 12  CREATININE 0.87  CALCIUM 9.4    Studies/Results: Dg Lumbar Spine 1 View  05/02/2014   CLINICAL DATA:  Re-exploration of lumbar wound for epidural abscess.  EXAM: LUMBAR SPINE - 1 VIEW  COMPARISON:  None.  FINDINGS: Single cross-table lateral portable view of the lumbar spine is obtained for surgical localization purposes. The metallic localization marker is projected over the posterior elements of L5, superiorly and at the level of the L5 endplate. Sponge marker is present. Degenerative changes are present in the lumbar spine.  IMPRESSION: Localization marker projected over the posterior elements of L5, posterior to the superior endplate.   Electronically Signed   By: Burman Nieves M.D.   On: 05/02/2014 22:25    Assessment/Plan: Continue her cultures results still pending no organisms yet maintains on day 2 of vancomycin and Rocephin will arrange PICC line placement possible discharge tomorrow  LOS: 2 days     Zelpha Messing P 05/04/2014, 8:15 AM

## 2014-05-05 ENCOUNTER — Encounter (HOSPITAL_COMMUNITY): Payer: Self-pay | Admitting: General Practice

## 2014-05-05 DIAGNOSIS — G061 Intraspinal abscess and granuloma: Secondary | ICD-10-CM

## 2014-05-05 LAB — WOUND CULTURE
Culture: NO GROWTH
Culture: NO GROWTH

## 2014-05-05 LAB — VANCOMYCIN, TROUGH: Vancomycin Tr: 15.6 ug/mL (ref 10.0–20.0)

## 2014-05-05 NOTE — Progress Notes (Signed)
Patient continues to complain of right thigh pain from the right hip to the mid thigh and also to the right shin area. Patient does not complain of back pain at the moment.

## 2014-05-05 NOTE — Progress Notes (Signed)
ANTIBIOTIC CONSULT NOTE - FOLLOW UP  Pharmacy Consult for vancomycin Indication: post-op prophylaxis, possible lumbar epidural abscess  No Known Allergies  Patient Measurements: Height: 6' (182.9 cm) Weight: 200 lb (90.719 kg) IBW/kg (Calculated) : 77.6  Vital Signs: Temp: 97.5 F (36.4 C) (09/07 0559) Temp src: Oral (09/07 0559) BP: 127/79 mmHg (09/07 0559) Pulse Rate: 89 (09/07 0559) Intake/Output from previous day: 09/06 0701 - 09/07 0700 In: 1520 [P.O.:960; I.V.:160; IV Piggyback:400] Out: 2300 [Urine:2300] Intake/Output from this shift: Total I/O In: 360 [P.O.:360] Out: 1000 [Urine:1000]  Labs:  Recent Labs  05/02/14 1929  WBC 10.5  HGB 13.4  PLT 363  CREATININE 0.87   Estimated Creatinine Clearance: 112.7 ml/min (by C-G formula based on Cr of 0.87).  Recent Labs  05/05/14 0825  VANCOTROUGH 15.6     Microbiology: Recent Results (from the past 720 hour(s))  GRAM STAIN     Status: None   Collection Time    05/02/14  9:15 PM      Result Value Ref Range Status   Specimen Description WOUND BACK   Final   Special Requests SUPERFICIAL LUMBAR WOUND SPECIMEN NO 1   Final   Gram Stain     Final   Value: RARE WBC PRESENT, PREDOMINANTLY MONONUCLEAR     NO ORGANISMS SEEN   Report Status 05/02/2014 FINAL   Final  WOUND CULTURE     Status: None   Collection Time    05/02/14  9:15 PM      Result Value Ref Range Status   Specimen Description WOUND BACK   Final   Special Requests SUPERFICIAL LUMBAR WOUND SPECIMEN NO 1   Final   Gram Stain     Final   Value: RARE WBC PRESENT, PREDOMINANTLY MONONUCLEAR     NO SQUAMOUS EPITHELIAL CELLS SEEN     NO ORGANISMS SEEN     Performed at [facility]     Performed at [facility]   Culture     Final   Value: NO GROWTH 2 DAYS     Performed at [facility]   Report Status 05/05/2014 FINAL   Final  ANAEROBIC CULTURE     Status: None   Collection Time    05/02/14  9:15 PM      Result Value Ref  Range Status   Specimen Description WOUND BACK   Final   Special Requests SUPERFICIAL LUMBAR WOUND SPECIMEN NO 1   Final   Gram Stain     Final   Value: RARE WBC PRESENT, PREDOMINANTLY MONONUCLEAR     NO ORGANISMS SEEN     Performed at [facility]     Performed at [facility]   Culture     Final   Value: NO ANAEROBES ISOLATED; CULTURE IN PROGRESS FOR 5 DAYS     Performed at [facility]   Report Status PENDING   Incomplete  GRAM STAIN     Status: None   Collection Time    05/02/14  9:21 PM      Result Value Ref Range Status   Specimen Description WOUND BACK   Final   Special Requests DEEP SPACE LUMBAR WOUND SPECIMEN NO 2   Final   Gram Stain     Final   Value: RARE WBC PRESENT,BOTH PMN AND MONONUCLEAR     NO ORGANISMS SEEN   Report Status 05/02/2014 FINAL   Final  WOUND CULTURE     Status: None  Collection Time    05/02/14  9:21 PM      Result Value Ref Range Status   Specimen Description WOUND BACK   Final   Special Requests DEEP SPACE LUMBAR WOUND SPECIMEN NO 2   Final   Gram Stain     Final   Value: RARE WBC PRESENT,BOTH PMN AND MONONUCLEAR     NO ORGANISMS SEEN     Performed at [facility]     Performed at [facility]   Culture     Final   Value: NO GROWTH 2 DAYS     Performed at [facility]   Report Status 05/05/2014 FINAL   Final  ANAEROBIC CULTURE     Status: None   Collection Time    05/02/14  9:21 PM      Result Value Ref Range Status   Specimen Description WOUND BACK   Final   Special Requests DEEP SPACE LUMBAR WOUND SPECIMEN NO 2   Final   Gram Stain     Final   Value: RARE WBC PRESENT,BOTH PMN AND MONONUCLEAR     NO ORGANISMS SEEN     Performed at [facility]     Performed at [facility]   Culture     Final   Value: NO ANAEROBES ISOLATED; CULTURE IN PROGRESS FOR 5 DAYS     Performed at [facility]   Report Status PENDING   Incomplete  CULTURE, ROUTINE-ABSCESS     Status: None    Collection Time    05/02/14  9:23 PM      Result Value Ref Range Status   Specimen Description ABSCESS BACK   Final   Special Requests EPIDURAL ABSCESS SPECIMEN NO 3   Final   Gram Stain     Final   Value: RARE WBC PRESENT, PREDOMINANTLY MONONUCLEAR     NO ORGANISMS SEEN     Performed at [facility]     Performed at [facility]   Culture     Final   Value: NO GROWTH 2 DAYS     Performed at [facility]   Report Status PENDING   Incomplete  ANAEROBIC CULTURE     Status: None   Collection Time    05/02/14  9:23 PM      Result Value Ref Range Status   Specimen Description ABSCESS BACK   Final   Special Requests EPIDURAL ABSCESS SPECIMEN NO 3   Final   Gram Stain     Final   Value: RARE WBC PRESENT, PREDOMINANTLY MONONUCLEAR     NO ORGANISMS SEEN     Performed at [facility]     Performed at [facility]   Culture     Final   Value: NO ANAEROBES ISOLATED; CULTURE IN PROGRESS FOR 5 DAYS     Performed at [facility]   Report Status PENDING   Incomplete  GRAM STAIN     Status: None   Collection Time    05/02/14  9:23 PM      Result Value Ref Range Status   Specimen Description ABSCESS BACK   Final   Special Requests EPIDURAL ABSCESS SPECIMEN NO 3   Final   Gram Stain     Final   Value: RARE WBC PRESENT, PREDOMINANTLY MONONUCLEAR     NO ORGANISMS SEEN   Report Status 05/02/2014 FINAL   Final  CULTURE, ROUTINE-ABSCESS     Status: None  Collection Time    05/02/14 10:03 PM      Result Value Ref Range Status   Specimen Description ABSCESS BACK   Final   Special Requests DISK SPACE SPECIMEN NO 4   Final   Gram Stain     Final   Value: NO WBC SEEN     NO ORGANISMS SEEN     Performed at [facility]     Performed at [facility]   Culture     Final   Value: NO GROWTH 2 DAYS     Performed at [facility]   Report Status PENDING   Incomplete  ANAEROBIC CULTURE     Status: None   Collection Time     05/02/14 10:03 PM      Result Value Ref Range Status   Specimen Description ABSCESS BACK   Final   Special Requests DISC SPACE SPECIMEN NO 4   Final   Gram Stain     Final   Value: NO WBC SEEN     NO ORGANISMS SEEN     Performed at [facility]     Performed at [facility]   Culture     Final   Value: NO ANAEROBES ISOLATED; CULTURE IN PROGRESS FOR 5 DAYS     Performed at [facility]   Report Status PENDING   Incomplete  GRAM STAIN     Status: None   Collection Time    05/02/14 10:03 PM      Result Value Ref Range Status   Specimen Description ABSCESS BACK   Final   Special Requests DISC SPACE SPECIMEN NO 4   Final   Gram Stain     Final   Value: NO WBC SEEN     NO ORGANISMS SEEN   Report Status 05/02/2014 FINAL   Final    Anti-infectives   Start     Dose/Rate Route Frequency Ordered Stop   05/03/14 0100  cefTRIAXone (ROCEPHIN) 2 g in dextrose 5 % 50 mL IVPB     2 g 100 mL/hr over 30 Minutes Intravenous Daily at bedtime 05/03/14 0014     05/03/14 0100  vancomycin (VANCOCIN) IVPB 1000 mg/200 mL premix     1,000 mg 200 mL/hr over 60 Minutes Intravenous Every 8 hours 05/03/14 0033     05/02/14 2116  bacitracin 50,000 Units in sodium chloride irrigation 0.9 % 500 mL irrigation  Status:  Discontinued       As needed 05/02/14 2157 05/02/14 2242      Assessment: 48 yo m s/p re-do laminectomy/discetomy L4-5 and aspiration of lumbar epidural fluid collection on 9/5.  Patient now with vac drain post-op.  Patient received 2gm of cefazolin pre-op.  Pharmacy was consulted to dose and adjust vancomycin for post-op prophylaxis and possible lumbar epidural abscess.  No labs since 9/4, wbc wnl then, tmax in last 24hr is 98.2.  CrCl > 100 ml/min.  Vanc trough this AM was within desired range of 15-20 at 15.6.  Will continue current regimen on 1000 mg IV q8hr.  9/4 Back wound superficial #1 >> ngtd 9/4 Back wound deep space #2 >> ngtd 9/4 Back abscess epidural #3 >>  ngtd 9/4 Back abscess disc space #4 >> ngtd  9/5 Vanc 1000 mg IV q8h >>  9/7 VT @ 0825: 15.6 on 1g q8hr   Goal of Therapy:  Vancomycin trough level 15-20 mcg/ml  Plan:  Continue vancomycin 1,000 mg IV q8hr Monitor  CBC, cultures, renal function, clinical course of patient VT as clinically indicated  Cassie L. Roseanne Reno, PharmD Clinical Pharmacy Resident Pager: [phone] 05/05/2014 10:28 AM

## 2014-05-05 NOTE — Progress Notes (Signed)
Agree with PTA.    Caeden Foots, PT [phone]  

## 2014-05-05 NOTE — Consult Note (Signed)
[facility] for Infectious Disease  Date of Admission:  05/02/2014  Date of Consult:  05/05/2014  Reason for Consult: Wound infection Referring Physician: Vertell Limber  Impression/Recommendation Wound infection  Would Continue vanco/ceftriaxone for 6 weeks.  He has pic Await final of Cx's Check HIV per cdc guidelines Will see him in f/u in ID clinic  Thank you so much for this interesting consult,   Bobby Rumpf (pager) [phone] [email or web address]  Brian [name] is an 48 y.o. male.  HPI: 48 yo M with hx of back and hip pain. He underwent laminectomy March 05, 2014. Afterwards he had some wound breakdown, managed locally. He had chills at that time. He returned to the hospital on 9-4 with continued pain. His WBC was normal, his was afebrile . He had CRP 2.3 and ESR 50. He had MRI as an outpatient which showed: "significant amount of enhancement in the vertebral bodies at L4 and L5 and it as well as extensive amount of enhancing phlegmon in the epidural space causing severe stenosis and compression of both the L4 and L5 nerve roots. " On 9-4 he underwent: "redo laminectomy at L4-5 redo discectomy L4-5 with microdissection of both the right L5 and L4 nerve roots with microscopic foraminotomies of both nerve roots and aspiration and cultures of lumbar epidural fluid collection." He has 4 Cx's- 2 are negative final, 2 are pending.  He was started on vanco/ceftriaxone.    Past Medical History  Diagnosis Date  . Hyperlipidemia 07-18-11    tx. with meds(high triglycerides)  . GERD (gastroesophageal reflux disease) 07-18-11    hx. reflux.  . Ventral hernia 07-18-11    surgery planned 07-27-11    Past Surgical History  Procedure Laterality Date  . Hernia repair  08/03/2011  . Shoulder surgery  07/1988    07-18-11  -left shoulder fracture repair  . Wrist surgery  07/1988    07-18-11 left with retained hardware  . Back surgery  10/2001    Microdisc.  . Adenoidectomy   07-18-11    as a child  . Ventral hernia repair  07-18-11    hx.   . Ventral hernia repair  07/27/2011    Procedure: LAPAROSCOPIC VENTRAL HERNIA;  Surgeon: Edward Jolly, MD;  Location: [facility];  Service: General;  Laterality: N/A;     No Known Allergies  Medications:  Scheduled: . aspirin  325 mg Oral Daily  . cefTRIAXone (ROCEPHIN)  IV  2 g Intravenous QHS  . docusate sodium  100 mg Oral BID  . gabapentin  100 mg Oral TID  . [START ON 05/06/2014] gabapentin  200 mg Oral TID  . [START ON 05/08/2014] gabapentin  300 mg Oral TID  . pantoprazole  40 mg Oral Daily  . sodium chloride  3 mL Intravenous Q12H  . vancomycin  1,000 mg Intravenous Q8H    Abtx:  Anti-infectives   Start     Dose/Rate Route Frequency Ordered Stop   05/03/14 0100  cefTRIAXone (ROCEPHIN) 2 g in dextrose 5 % 50 mL IVPB     2 g 100 mL/hr over 30 Minutes Intravenous Daily at bedtime 05/03/14 0014     05/03/14 0100  vancomycin (VANCOCIN) IVPB 1000 mg/200 mL premix     1,000 mg 200 mL/hr over 60 Minutes Intravenous Every 8 hours 05/03/14 0033     05/02/14 2116  bacitracin 50,000 Units in sodium chloride irrigation 0.9 % 500 mL irrigation  Status:  Discontinued  As needed 05/02/14 2157 05/02/14 2242      Total days of antibiotics : 3 vanco/ceftriaxone          Social History:  reports that he has never smoked. He has never used smokeless tobacco. He reports that he does not drink alcohol or use illicit drugs.  Family History  Problem Relation Age of Onset  . Heart disease Father   . Heart disease Paternal Uncle     General ROS: no change in BM, normal urination, no fever, pain in R foot radiated from back, see HPI.   Blood pressure 127/79, pulse 89, temperature 97.5 F (36.4 C), temperature source Oral, resp. rate 18, height 6' (1.829 m), weight 90.719 kg (200 lb), SpO2 100.00%. General appearance: alert, cooperative and no distress Eyes: negative findings: conjunctivae and sclerae normal and  pupils equal, round, reactive to light and accomodation Throat: lips, mucosa, and tongue normal; teeth and gums normal Neck: no adenopathy and supple, symmetrical, trachea midline Lungs: clear to auscultation bilaterally Heart: regular rate and rhythm Abdomen: normal findings: bowel sounds normal and soft, non-tender Extremities: edema none and RUE PIC Neurologic: Sensory: normal, grossly nl to light touch BLE wound is dressed, clean.    Results for orders placed during the hospital encounter of 05/02/14 (from the past 48 hour(s))  VANCOMYCIN, TROUGH     Status: None   Collection Time    05/05/14  8:25 AM      Result Value Ref Range   Vancomycin Tr 15.6  10.0 - 20.0 ug/mL      Component Value Date/Time   SDES ABSCESS BACK 05/02/2014 2203   SDES ABSCESS BACK 05/02/2014 2203   SDES ABSCESS BACK 05/02/2014 2203   SPECREQUEST DISK SPACE SPECIMEN NO 4 05/02/2014 2203   SPECREQUEST DISC SPACE SPECIMEN NO 4 05/02/2014 2203   SPECREQUEST DISC SPACE SPECIMEN NO 4 05/02/2014 2203   CULT  Value: NO GROWTH 2 DAYS Performed at [facility] 05/02/2014 2203   CULT  Value: NO ANAEROBES ISOLATED; CULTURE IN PROGRESS FOR 5 DAYS Performed at [facility] 05/02/2014 2203   REPTSTATUS PENDING 05/02/2014 2203   REPTSTATUS PENDING 05/02/2014 2203   REPTSTATUS 05/02/2014 FINAL 05/02/2014 2203   No results found. Recent Results (from the past 240 hour(s))  GRAM STAIN     Status: None   Collection Time    05/02/14  9:15 PM      Result Value Ref Range Status   Specimen Description WOUND BACK   Final   Special Requests SUPERFICIAL LUMBAR WOUND SPECIMEN NO 1   Final   Gram Stain     Final   Value: RARE WBC PRESENT, PREDOMINANTLY MONONUCLEAR     NO ORGANISMS SEEN   Report Status 05/02/2014 FINAL   Final  WOUND CULTURE     Status: None   Collection Time    05/02/14  9:15 PM      Result Value Ref Range Status   Specimen Description WOUND BACK   Final   Special Requests SUPERFICIAL LUMBAR WOUND SPECIMEN NO  1   Final   Gram Stain     Final   Value: RARE WBC PRESENT, PREDOMINANTLY MONONUCLEAR     NO SQUAMOUS EPITHELIAL CELLS SEEN     NO ORGANISMS SEEN     Performed at [facility]     Performed at [facility]   Culture     Final   Value: NO GROWTH 2 DAYS     Performed  at [facility]   Report Status 05/05/2014 FINAL   Final  ANAEROBIC CULTURE     Status: None   Collection Time    05/02/14  9:15 PM      Result Value Ref Range Status   Specimen Description WOUND BACK   Final   Special Requests SUPERFICIAL LUMBAR WOUND SPECIMEN NO 1   Final   Gram Stain     Final   Value: RARE WBC PRESENT, PREDOMINANTLY MONONUCLEAR     NO ORGANISMS SEEN     Performed at [facility]     Performed at [facility]   Culture     Final   Value: NO ANAEROBES ISOLATED; CULTURE IN PROGRESS FOR 5 DAYS     Performed at [facility]   Report Status PENDING   Incomplete  GRAM STAIN     Status: None   Collection Time    05/02/14  9:21 PM      Result Value Ref Range Status   Specimen Description WOUND BACK   Final   Special Requests DEEP SPACE LUMBAR WOUND SPECIMEN NO 2   Final   Gram Stain     Final   Value: RARE WBC PRESENT,BOTH PMN AND MONONUCLEAR     NO ORGANISMS SEEN   Report Status 05/02/2014 FINAL   Final  WOUND CULTURE     Status: None   Collection Time    05/02/14  9:21 PM      Result Value Ref Range Status   Specimen Description WOUND BACK   Final   Special Requests DEEP SPACE LUMBAR WOUND SPECIMEN NO 2   Final   Gram Stain     Final   Value: RARE WBC PRESENT,BOTH PMN AND MONONUCLEAR     NO ORGANISMS SEEN     Performed at [facility]     Performed at [facility]   Culture     Final   Value: NO GROWTH 2 DAYS     Performed at [facility]   Report Status 05/05/2014 FINAL   Final  ANAEROBIC CULTURE     Status: None   Collection Time    05/02/14  9:21 PM      Result Value Ref Range Status   Specimen Description WOUND BACK    Final   Special Requests DEEP SPACE LUMBAR WOUND SPECIMEN NO 2   Final   Gram Stain     Final   Value: RARE WBC PRESENT,BOTH PMN AND MONONUCLEAR     NO ORGANISMS SEEN     Performed at [facility]     Performed at [facility]   Culture     Final   Value: NO ANAEROBES ISOLATED; CULTURE IN PROGRESS FOR 5 DAYS     Performed at [facility]   Report Status PENDING   Incomplete  CULTURE, ROUTINE-ABSCESS     Status: None   Collection Time    05/02/14  9:23 PM      Result Value Ref Range Status   Specimen Description ABSCESS BACK   Final   Special Requests EPIDURAL ABSCESS SPECIMEN NO 3   Final   Gram Stain     Final   Value: RARE WBC PRESENT, PREDOMINANTLY MONONUCLEAR     NO ORGANISMS SEEN     Performed at [facility]     Performed at [facility]   Culture     Final   Value: NO GROWTH  2 DAYS     Performed at [facility]   Report Status PENDING   Incomplete  ANAEROBIC CULTURE     Status: None   Collection Time    05/02/14  9:23 PM      Result Value Ref Range Status   Specimen Description ABSCESS BACK   Final   Special Requests EPIDURAL ABSCESS SPECIMEN NO 3   Final   Gram Stain     Final   Value: RARE WBC PRESENT, PREDOMINANTLY MONONUCLEAR     NO ORGANISMS SEEN     Performed at [facility]     Performed at [facility]   Culture     Final   Value: NO ANAEROBES ISOLATED; CULTURE IN PROGRESS FOR 5 DAYS     Performed at [facility]   Report Status PENDING   Incomplete  GRAM STAIN     Status: None   Collection Time    05/02/14  9:23 PM      Result Value Ref Range Status   Specimen Description ABSCESS BACK   Final   Special Requests EPIDURAL ABSCESS SPECIMEN NO 3   Final   Gram Stain     Final   Value: RARE WBC PRESENT, PREDOMINANTLY MONONUCLEAR     NO ORGANISMS SEEN   Report Status 05/02/2014 FINAL   Final  CULTURE, ROUTINE-ABSCESS     Status: None   Collection Time    05/02/14 10:03 PM      Result  Value Ref Range Status   Specimen Description ABSCESS BACK   Final   Special Requests DISK SPACE SPECIMEN NO 4   Final   Gram Stain     Final   Value: NO WBC SEEN     NO ORGANISMS SEEN     Performed at [facility]     Performed at [facility]   Culture     Final   Value: NO GROWTH 2 DAYS     Performed at [facility]   Report Status PENDING   Incomplete  ANAEROBIC CULTURE     Status: None   Collection Time    05/02/14 10:03 PM      Result Value Ref Range Status   Specimen Description ABSCESS BACK   Final   Special Requests [place] SPACE SPECIMEN NO 4   Final   Gram Stain     Final   Value: NO WBC SEEN     NO ORGANISMS SEEN     Performed at [facility]     Performed at [facility]   Culture     Final   Value: NO ANAEROBES ISOLATED; CULTURE IN PROGRESS FOR 5 DAYS     Performed at [facility]   Report Status PENDING   Incomplete  GRAM STAIN     Status: None   Collection Time    05/02/14 10:03 PM      Result Value Ref Range Status   Specimen Description ABSCESS BACK   Final   Special Requests DISC SPACE SPECIMEN NO 4   Final   Gram Stain     Final   Value: NO WBC SEEN     NO ORGANISMS SEEN   Report Status 05/02/2014 FINAL   Final      05/05/2014, 1:51 PM     LOS: 3 days

## 2014-05-05 NOTE — Progress Notes (Signed)
Physical Therapy Treatment Patient Details Name: LAVAL CAFARO MRN: [id number] DOB: 1966-08-28 Today's Date: 05/05/2014    History of Present Illness 48 y.o. male s/p LUMBAR LAMINECTOMY FOR EPIDURAL ABSCESS, lumbar four-five right .    PT Comments    Patient feeling much better this AM. Pain in L leg gone. Patient safe to D/C from a mobility standpoint based on progression towards goals set on PT eval. Will signoff on acute PT   Follow Up Recommendations  No PT follow up     Equipment Recommendations       Recommendations for Other Services       Precautions / Restrictions Precautions Precautions: Back;Fall Precaution Comments: Reviewed back precautions    Mobility  Bed Mobility Overal bed mobility: Modified Independent                Transfers Overall transfer level: Modified independent                  Ambulation/Gait Ambulation/Gait assistance: Modified independent (Device/Increase time) Ambulation Distance (Feet): 1200 Feet         General Gait Details: Able to ambulate without device. No LOB or increased pain   Stairs Stairs: Yes Stairs assistance: Modified independent (Device/Increase time)   Number of Stairs: 4 General stair comments: Patient with safe technique  Wheelchair Mobility    Modified Rankin (Stroke Patients Only)       Balance                                    Cognition Arousal/Alertness: Awake/alert Behavior During Therapy: WFL for tasks assessed/performed Overall Cognitive Status: Within Functional Limits for tasks assessed                      Exercises      General Comments        Pertinent Vitals/Pain      Home Living                      Prior Function            PT Goals (current goals can now be found in the care plan section) Progress towards PT goals: Goals met/education completed, patient discharged from PT    Frequency       PT Plan       Co-evaluation             End of Session   Activity Tolerance: Patient tolerated treatment well Patient left:  (walking with wife in hall)     Time: 7972-8206 PT Time Calculation (min): 11 min  Charges:  $Gait Training: 8-22 mins                    G Codes:      Jacqualyn Posey 05/05/2014, 12:28 PM 05/05/2014 Jacqualyn Posey PTA [phone] pager [phone] office

## 2014-05-05 NOTE — Progress Notes (Signed)
Subjective: Patient reports feeling a lot better. Right leg pain improved  Objective: Vital signs in last 24 hours: Temp:  [97.5 F (36.4 C)-98.2 F (36.8 C)] 97.5 F (36.4 C) (09/07 0559) Pulse Rate:  [88-96] 89 (09/07 0559) Resp:  [18] 18 (09/07 0559) BP: (127-145)/(79-86) 127/79 mmHg (09/07 0559) SpO2:  [97 %-100 %] 100 % (09/07 0559)  Intake/Output from previous day: 09/06 0701 - 09/07 0700 In: 1520 [P.O.:960; I.V.:160; IV Piggyback:400] Out: 2300 [Urine:2300] Intake/Output this shift: Total I/O In: 360 [P.O.:360] Out: 1000 [Urine:1000]  Physical Exam: Leg strength good.  Dressing CDI.  Drain in place.  Lab Results:  Recent Labs  05/02/14 1929  WBC 10.5  HGB 13.4  HCT 38.4*  PLT 363   BMET  Recent Labs  05/02/14 1929  NA 140  K 4.4  CL 100  CO2 27  GLUCOSE 133*  BUN 12  CREATININE 0.87  CALCIUM 9.4    Studies/Results: No results found.  Assessment/Plan: So far, culture results negative.  Awaiting results after 48 Hours.  PICC line in place.  Will consult ID for antibiotic plan and management.    LOS: 3 days    Dorian Heckle, MD 05/05/2014, 11:27 AM

## 2014-05-06 ENCOUNTER — Encounter (HOSPITAL_COMMUNITY): Payer: Self-pay | Admitting: Neurosurgery

## 2014-05-06 DIAGNOSIS — M62838 Other muscle spasm: Secondary | ICD-10-CM

## 2014-05-06 LAB — CULTURE, ROUTINE-ABSCESS
Culture: NO GROWTH
Culture: NO GROWTH
Gram Stain: NONE SEEN

## 2014-05-06 LAB — HIV ANTIBODY (ROUTINE TESTING W REFLEX): HIV 1&2 Ab, 4th Generation: NONREACTIVE

## 2014-05-06 MED ORDER — HEPARIN SOD (PORK) LOCK FLUSH 100 UNIT/ML IV SOLN
250.0000 [IU] | INTRAVENOUS | Status: AC | PRN
  Administered 2014-05-06: 250 [IU]

## 2014-05-06 MED ORDER — VANCOMYCIN HCL 10 G IV SOLR
1500.0000 mg | Freq: Two times a day (BID) | INTRAVENOUS | Status: DC
  Filled 2014-05-06: qty 1500

## 2014-05-06 MED ORDER — VANCOMYCIN HCL 10 G IV SOLR: 1500.0000 mg | Freq: Two times a day (BID) | INTRAVENOUS | Status: DC

## 2014-05-06 MED ORDER — GABAPENTIN 300 MG PO CAPS: 300.0000 mg | ORAL_CAPSULE | Freq: Three times a day (TID) | ORAL | Status: DC

## 2014-05-06 MED ORDER — DEXTROSE 5 % IV SOLN: 2.0000 g | Freq: Every day | INTRAVENOUS | Status: DC

## 2014-05-06 NOTE — Progress Notes (Signed)
Nutrition Brief Note  Patient identified on the Malnutrition Screening Tool (MST) Report  Wt Readings from Last 15 Encounters:  05/02/14 200 lb (90.719 kg)  05/02/14 200 lb (90.719 kg)  01/16/14 204 lb 3.2 oz (92.625 kg)  08/26/11 218 lb 6.4 oz (99.066 kg)  07/18/11 221 lb 6 oz (100.415 kg)  07/12/11 220 lb (99.791 kg)    Body mass index is 27.12 kg/(m^2). Patient meets criteria for overweight based on current BMI.   Current diet order is regular, patient is consuming approximately 90-100% of meals at this time. Labs and medications reviewed.   No nutrition interventions warranted at this time. If nutrition issues arise, please consult RD.   Marijean Niemann, MS, Provisional LDN Pager # [phone] After hours/ weekend pager # [phone]

## 2014-05-06 NOTE — Discharge Summary (Signed)
Physician Discharge Summary  Patient ID: Brian [name] MRN: [id number] DOB/AGE: 09-11-1965 48 y.o.  Admit date: 05/02/2014 Discharge date: 05/06/2014  Admission Diagnoses:  Discharge Diagnoses:  Active Problems:   Spinal epidural abscess   Discharged Condition: good  Hospital Course: Patient admitted the hospital with a postoperative L4-5 discitis. Taken to the operating room and laminotomy reexplored and inflammatory tissue removed. Cultures negative so far but sedimentation rate elevated consistent with osteomyelitis discitis. Plan for an. 6 week course of vancomycin and Rocephin. Patient ready for discharge home. Feeling much better. Minimal right lower extremity pain. Strength sensation have returned to normal.  Consults:   Significant Diagnostic Studies:   Treatments:   Discharge Exam: Blood pressure 127/88, pulse 82, temperature 98.4 F (36.9 C), temperature source Oral, resp. rate 14, height 6' (1.829 m), weight 90.719 kg (200 lb), SpO2 97.00%. Awake and alert. Oriented and appropriate. Motor and sensory function intact. Straight leg raising negative bilaterally. Wound clean and dry. Chest and abdomen benign.   Disposition: 01-Home or Self Care     Medication List         aspirin 325 MG tablet  - Take 325 mg by mouth daily. PAIN  -      cefTRIAXone 2 g in dextrose 5 % 50 mL  Inject 2 g into the vein at bedtime.     gabapentin 300 MG capsule  Commonly known as:  NEURONTIN  Take 1 capsule (300 mg total) by mouth 3 (three) times daily.  Start taking on:  05/08/2014     HYDROcodone-acetaminophen 5-325 MG per tablet  Commonly known as:  NORCO/VICODIN  Take 1 tablet by mouth every 6 (six) hours as needed for moderate pain.     ibuprofen 200 MG tablet  Commonly known as:  ADVIL,MOTRIN  Take 800 mg by mouth every 6 (six) hours as needed for moderate pain.     MAGNESIUM CHLORIDE-CALCIUM PO  Take 1 tablet by mouth daily.     methylPREDNISolone 4 MG tablet   Commonly known as:  MEDROL DOSEPAK  Take 4-24 mg by mouth daily. follow package directions     omeprazole 20 MG capsule  Commonly known as:  PRILOSEC  Take 20 mg by mouth at bedtime.     oxyCODONE-acetaminophen 5-325 MG per tablet  Commonly known as:  PERCOCET/ROXICET  Take 1-2 tablets by mouth every 6 (six) hours as needed for severe pain.     vancomycin 1,500 mg in sodium chloride 0.9 % 500 mL  Inject 1,500 mg into the vein every 12 (twelve) hours.         Signed: Yorley Buch A 05/06/2014, 11:54 AM  3

## 2014-05-06 NOTE — Care Management Note (Addendum)
CARE MANAGEMENT NOTE 05/06/2014  Patient:  Brian [name], Brian [name]   Account Number:  [id number]  Date Initiated:  05/06/2014  Documentation initiated by:  Vance Peper  Subjective/Objective Assessment:   48 yr old male admitted with spinal epidural abscess, s/p redo L4-5 microdiscection, redo discectomy of right L5 and L4 nerve roots.     Action/Plan:   Case manager spoke with patient's wife concerning need for home health RN. Choice offered. Referral called to Darlin Drop, [facility] liaison. Patient going home with PICC for IV antibiotics for 6 weeks..SOC9/8/15   evening hrs.   Anticipated DC Date:  05/06/2014   Anticipated DC Plan:  HOME W HOME HEALTH SERVICES      DC Planning Services  CM consult      [facility] Choice  HOME HEALTH   Choice offered to / List presented to:  C-3 Spouse   DME arranged  NA        HH arranged  HH-1 RN  IV Antibiotics      HH agency  [facility]   Status of service:  Completed, signed off Medicare Important Message given?   (If response is "NO", the following Medicare IM given date fields will be blank) Date Medicare IM given:   Medicare IM given by:   Date Additional Medicare IM given:   Additional Medicare IM given by:    Discharge Disposition:  HOME W HOME HEALTH SERVICES  Per UR Regulation:  Reviewed for med. necessity/level of care/duration of stay

## 2014-05-06 NOTE — Discharge Instructions (Signed)

## 2014-05-06 NOTE — Progress Notes (Signed)
INFECTIOUS DISEASE PROGRESS NOTE  ID: Brian [name] is a 48 y.o. male with  Active Problems:   Spinal epidural abscess  Subjective: C/o back spasms  Abtx:  Anti-infectives   Start     Dose/Rate Route Frequency Ordered Stop   05/06/14 2100  vancomycin (VANCOCIN) 1,500 mg in sodium chloride 0.9 % 500 mL IVPB     1,500 mg 250 mL/hr over 120 Minutes Intravenous Every 12 hours 05/06/14 1121     05/06/14 0000  cefTRIAXone 2 g in dextrose 5 % 50 mL     2 g 100 mL/hr over 30 Minutes Intravenous Daily at bedtime 05/06/14 1153     05/06/14 0000  vancomycin 1,500 mg in sodium chloride 0.9 % 500 mL     1,500 mg 250 mL/hr over 120 Minutes Intravenous Every 12 hours 05/06/14 1153     05/03/14 0100  cefTRIAXone (ROCEPHIN) 2 g in dextrose 5 % 50 mL IVPB     2 g 100 mL/hr over 30 Minutes Intravenous Daily at bedtime 05/03/14 0014     05/03/14 0100  vancomycin (VANCOCIN) IVPB 1000 mg/200 mL premix  Status:  Discontinued     1,000 mg 200 mL/hr over 60 Minutes Intravenous Every 8 hours 05/03/14 0033 05/06/14 1121   05/02/14 2116  bacitracin 50,000 Units in sodium chloride irrigation 0.9 % 500 mL irrigation  Status:  Discontinued       As needed 05/02/14 2157 05/02/14 2242      Medications:  Scheduled: . aspirin  325 mg Oral Daily  . cefTRIAXone (ROCEPHIN)  IV  2 g Intravenous QHS  . docusate sodium  100 mg Oral BID  . gabapentin  200 mg Oral TID  . [START ON 05/08/2014] gabapentin  300 mg Oral TID  . pantoprazole  40 mg Oral Daily  . sodium chloride  3 mL Intravenous Q12H  . vancomycin  1,500 mg Intravenous Q12H    Objective: Vital signs in last 24 hours: Temp:  [97.7 F (36.5 C)-99.4 F (37.4 C)] 98.6 F (37 C) (09/08 1408) Pulse Rate:  [80-104] 84 (09/08 1408) Resp:  [13-14] 13 (09/08 1408) BP: (118-128)/(60-88) 118/60 mmHg (09/08 1408) SpO2:  [95 %-99 %] 95 % (09/08 1408)   General appearance: alert and no distress Resp: clear to auscultation bilaterally Cardio: regular  rate and rhythm GI: normal findings: bowel sounds normal and soft, non-tender  Lab Results No results found for this basename: WBC, HGB, HCT, PLATELETS, NA, K, CL, CO2, BUN, CREATININE, GLU,  in the last 72 hours Liver Panel No results found for this basename: PROT, ALBUMIN, AST, ALT, ALKPHOS, BILITOT, BILIDIR, IBILI,  in the last 72 hours Sedimentation Rate No results found for this basename: ESRSEDRATE,  in the last 72 hours C-Reactive Protein No results found for this basename: CRP,  in the last 72 hours  Microbiology: Recent Results (from the past 240 hour(s))  GRAM STAIN     Status: None   Collection Time    05/02/14  9:15 PM      Result Value Ref Range Status   Specimen Description WOUND BACK   Final   Special Requests SUPERFICIAL LUMBAR WOUND SPECIMEN NO 1   Final   Gram Stain     Final   Value: RARE WBC PRESENT, PREDOMINANTLY MONONUCLEAR     NO ORGANISMS SEEN   Report Status 05/02/2014 FINAL   Final  WOUND CULTURE     Status: None   Collection Time    05/02/14  9:15 PM      Result Value Ref Range Status   Specimen Description WOUND BACK   Final   Special Requests SUPERFICIAL LUMBAR WOUND SPECIMEN NO 1   Final   Gram Stain     Final   Value: RARE WBC PRESENT, PREDOMINANTLY MONONUCLEAR     NO SQUAMOUS EPITHELIAL CELLS SEEN     NO ORGANISMS SEEN     Performed at [facility]     Performed at [facility]   Culture     Final   Value: NO GROWTH 2 DAYS     Performed at [facility]   Report Status 05/05/2014 FINAL   Final  ANAEROBIC CULTURE     Status: None   Collection Time    05/02/14  9:15 PM      Result Value Ref Range Status   Specimen Description WOUND BACK   Final   Special Requests SUPERFICIAL LUMBAR WOUND SPECIMEN NO 1   Final   Gram Stain     Final   Value: RARE WBC PRESENT, PREDOMINANTLY MONONUCLEAR     NO ORGANISMS SEEN     Performed at [facility]     Performed at [facility]   Culture     Final   Value: NO  ANAEROBES ISOLATED; CULTURE IN PROGRESS FOR 5 DAYS     Performed at [facility]   Report Status PENDING   Incomplete  GRAM STAIN     Status: None   Collection Time    05/02/14  9:21 PM      Result Value Ref Range Status   Specimen Description WOUND BACK   Final   Special Requests DEEP SPACE LUMBAR WOUND SPECIMEN NO 2   Final   Gram Stain     Final   Value: RARE WBC PRESENT,BOTH PMN AND MONONUCLEAR     NO ORGANISMS SEEN   Report Status 05/02/2014 FINAL   Final  WOUND CULTURE     Status: None   Collection Time    05/02/14  9:21 PM      Result Value Ref Range Status   Specimen Description WOUND BACK   Final   Special Requests DEEP SPACE LUMBAR WOUND SPECIMEN NO 2   Final   Gram Stain     Final   Value: RARE WBC PRESENT,BOTH PMN AND MONONUCLEAR     NO ORGANISMS SEEN     Performed at [facility]     Performed at [facility]   Culture     Final   Value: NO GROWTH 2 DAYS     Performed at [facility]   Report Status 05/05/2014 FINAL   Final  ANAEROBIC CULTURE     Status: None   Collection Time    05/02/14  9:21 PM      Result Value Ref Range Status   Specimen Description WOUND BACK   Final   Special Requests DEEP SPACE LUMBAR WOUND SPECIMEN NO 2   Final   Gram Stain     Final   Value: RARE WBC PRESENT,BOTH PMN AND MONONUCLEAR     NO ORGANISMS SEEN     Performed at [facility]     Performed at [facility]   Culture     Final   Value: NO ANAEROBES ISOLATED; CULTURE IN PROGRESS FOR 5 DAYS     Performed at [facility]   Report Status PENDING   Incomplete  CULTURE, ROUTINE-ABSCESS     Status: None   Collection Time    05/02/14  9:23 PM      Result Value Ref Range Status   Specimen Description ABSCESS BACK   Final   Special Requests EPIDURAL ABSCESS SPECIMEN NO 3   Final   Gram Stain     Final   Value: RARE WBC PRESENT, PREDOMINANTLY MONONUCLEAR     NO ORGANISMS SEEN     Performed at [facility]     Performed  at [facility]   Culture     Final   Value: NO GROWTH 3 DAYS     Performed at [facility]   Report Status 05/06/2014 FINAL   Final  ANAEROBIC CULTURE     Status: None   Collection Time    05/02/14  9:23 PM      Result Value Ref Range Status   Specimen Description ABSCESS BACK   Final   Special Requests EPIDURAL ABSCESS SPECIMEN NO 3   Final   Gram Stain     Final   Value: RARE WBC PRESENT, PREDOMINANTLY MONONUCLEAR     NO ORGANISMS SEEN     Performed at [facility]     Performed at [facility]   Culture     Final   Value: NO ANAEROBES ISOLATED; CULTURE IN PROGRESS FOR 5 DAYS     Performed at [facility]   Report Status PENDING   Incomplete  GRAM STAIN     Status: None   Collection Time    05/02/14  9:23 PM      Result Value Ref Range Status   Specimen Description ABSCESS BACK   Final   Special Requests EPIDURAL ABSCESS SPECIMEN NO 3   Final   Gram Stain     Final   Value: RARE WBC PRESENT, PREDOMINANTLY MONONUCLEAR     NO ORGANISMS SEEN   Report Status 05/02/2014 FINAL   Final  CULTURE, ROUTINE-ABSCESS     Status: None   Collection Time    05/02/14 10:03 PM      Result Value Ref Range Status   Specimen Description ABSCESS BACK   Final   Special Requests DISK SPACE SPECIMEN NO 4   Final   Gram Stain     Final   Value: NO WBC SEEN     NO ORGANISMS SEEN     Performed at [facility]     Performed at [facility]   Culture     Final   Value: NO GROWTH 3 DAYS     Performed at [facility]   Report Status 05/06/2014 FINAL   Final  ANAEROBIC CULTURE     Status: None   Collection Time    05/02/14 10:03 PM      Result Value Ref Range Status   Specimen Description ABSCESS BACK   Final   Special Requests DISC SPACE SPECIMEN NO 4   Final   Gram Stain     Final   Value: NO WBC SEEN     NO ORGANISMS SEEN     Performed at [facility]     Performed at [facility]   Culture     Final   Value:  NO ANAEROBES ISOLATED; CULTURE IN PROGRESS FOR 5 DAYS     Performed at [facility]   Report Status PENDING   Incomplete  GRAM STAIN     Status: None  Collection Time    05/02/14 10:03 PM      Result Value Ref Range Status   Specimen Description ABSCESS BACK   Final   Special Requests DISC SPACE SPECIMEN NO 4   Final   Gram Stain     Final   Value: NO WBC SEEN     NO ORGANISMS SEEN   Report Status 05/02/2014 FINAL   Final    Studies/Results: No results found.   Assessment/Plan: Wound infection Cx (-)  Total days of antibiotics: 4 vanco/ceftriaxone  Wound treat him with anbx for 6 weeks D/c planning.  Will see him back in ID clinic         Johny Sax Infectious Diseases (pager) [phone] [email or web address] 05/06/2014, 2:40 PM  LOS: 4 days

## 2014-05-06 NOTE — Progress Notes (Signed)
Utilization review completed.  

## 2014-05-06 NOTE — Progress Notes (Signed)
ANTIBIOTIC CONSULT NOTE - FOLLOW UP  Pharmacy Consult for vancomycin Indication: post-op prophylaxis, possible lumbar epidural abscess  No Known Allergies   Recent Labs  05/05/14 0825  VANCOTROUGH 15.6     Assessment: 49 yo m s/p re-do laminectomy/discetomy L4-5 and aspiration of lumbar epidural fluid collection on 9/5.  Patient now with vac drain post-op.  Patient received 2gm of cefazolin pre-op.  Pharmacy was consulted to dose and adjust vancomycin for post-op prophylaxis and possible lumbar epidural abscess.    9/4 Back wound superficial #1 >> ngtd 9/4 Back wound deep space #2 >> ngtd 9/4 Back abscess epidural #3 >> ngtd 9/4 Back abscess disc space #4 >> ngtd  9/5 Vanc 1000 mg IV q8h >>  9/7 VT @ 0825: 15.6 on 1g q8hr  ID is recommending 6 weeks of Vancomycin / Rocephin therapy  Goal of Therapy:  Vancomycin trough level 15-20 mcg/ml  Plan:  Change Vancomycin to 1500 mg iv Q 12 hours (in preparation for home) Rocephin 2 grams iv Q 24 hours Monitor CBC, cultures, renal function, clinical course of patient  Thank you Okey Regal, PharmD Pager: [phone] 05/06/2014 11:22 AM

## 2014-05-06 NOTE — Progress Notes (Signed)
°  [facility]  Patient Status: New  [facility] is providing the following services: RN  If patient discharges after hours, please call [phone].   Brian [name] 05/06/2014, 5:29 PM  Referral received by Darlin Drop from Vance Peper CM for RN and Pharmacy for IV antibiotics.

## 2014-05-07 NOTE — Progress Notes (Signed)
Chart and note reviewed. Agree with note.  Kimberly Harris, RD, LDN, CNSC Pager [phone] After Hours Pager [phone]   

## 2014-05-08 LAB — ANAEROBIC CULTURE: Gram Stain: NONE SEEN

## 2014-05-20 ENCOUNTER — Ambulatory Visit (INDEPENDENT_AMBULATORY_CARE_PROVIDER_SITE_OTHER): Payer: BC Managed Care – PPO | Admitting: Internal Medicine

## 2014-05-20 ENCOUNTER — Encounter: Payer: Self-pay | Admitting: Internal Medicine

## 2014-05-20 VITALS — Wt 191.0 lb

## 2014-05-20 DIAGNOSIS — G061 Intraspinal abscess and granuloma: Secondary | ICD-10-CM | POA: Diagnosis not present

## 2014-05-20 NOTE — Progress Notes (Signed)
Rfv: post surgical site infection spinal epidural abscess HFU Subjective:    Patient ID: Brian [name], male    DOB: [birth year], 48 y.o.   MRN: [id number]  HPI Patient is a very pleasant 48 year old gentleman initially  undergone a right-sided L4-5 laminectomy discectomy initially did very well up until 2 months ago when he started to have several weeks of progressive worsening back right hip and leg pain refractory anti-inflammatories and time patient received an outpatient MRI scan that was highly suspicious for osteomyelitis discitis and lumbar epidural abscess the patient was emergently brought to the ER and taken to the OR for reexploration of lumbar wound redo L4-5 laminectomy and discectomy with aspiration in and out of fluid and cultures.  He says it radiates to his hip anterior quad down the front of his shin consistent with sounds like an L4 nerve root pattern.  OR cultures were no growth to date. He was discharged on 6 wk of vancomycin and ceftriaxone using 9/4 as day #1.   Still having right leg pain but much, much improved. Less cramping. No fever, chills, nightsweats, diarrhea, thrush but having inguinal yeast infection.  No Known Allergies Current Outpatient Prescriptions on File Prior to Visit  Medication Sig Dispense Refill  . aspirin 325 MG tablet Take 325 mg by mouth daily. PAIN       . cefTRIAXone 2 g in dextrose 5 % 50 mL Inject 2 g into the vein at bedtime.  28 cartridge  1  . gabapentin (NEURONTIN) 300 MG capsule Take 1 capsule (300 mg total) by mouth 3 (three) times daily.  90 capsule  0  . HYDROcodone-acetaminophen (NORCO/VICODIN) 5-325 MG per tablet Take 1 tablet by mouth every 6 (six) hours as needed for moderate pain.      Marland Kitchen ibuprofen (ADVIL,MOTRIN) 200 MG tablet Take 800 mg by mouth every 6 (six) hours as needed for moderate pain.       Marland Kitchen MAGNESIUM CHLORIDE-CALCIUM PO Take 1 tablet by mouth daily.      Marland Kitchen omeprazole (PRILOSEC) 20 MG capsule Take 20 mg by mouth at  bedtime.       Marland Kitchen oxyCODONE-acetaminophen (PERCOCET/ROXICET) 5-325 MG per tablet Take 1-2 tablets by mouth every 6 (six) hours as needed for severe pain.      . vancomycin 1,500 mg in sodium chloride 0.9 % 500 mL Inject 1,500 mg into the vein every 12 (twelve) hours.  56 cartridge  1  . methylPREDNISolone (MEDROL DOSEPAK) 4 MG tablet Take 4-24 mg by mouth daily. follow package directions       No current facility-administered medications on file prior to visit.   Active Ambulatory Problems    Diagnosis Date Noted  . Hyperlipidemia 07/18/2011  . GERD (gastroesophageal reflux disease) 07/18/2011  . Ventral hernia 07/18/2011  . Family history of early CAD 01/16/2014  . Spinal epidural abscess 05/02/2014   Resolved Ambulatory Problems    Diagnosis Date Noted  . No Resolved Ambulatory Problems   No Additional Past Medical History     Review of Systems   all 10 point ros is negative except candidal skin infection Objective:   Physical Exam Wt 191 lb (86.637 kg) Physical Exam  Constitutional:  oriented to person, place, and time. appears well-developed and well-nourished. No distress.  HENT:  Mouth/Throat: Oropharynx is clear and moist. No oropharyngeal exudate.  Cardiovascular: Normal rate, regular rhythm and normal heart sounds. Exam reveals no gallop and no friction rub.  No murmur heard.  Pulmonary/Chest: Effort  normal and breath sounds normal. No respiratory distress.  has no wheezes.  Abdominal: Soft. Bowel sounds are normal.  exhibits no distension. There is no tenderness.  Lymphadenopathy: no cervical adenopathy.  Neurological: alert and oriented to person, place, and time.  Ext: picc line is c/d/i slight erythema at insertion site Back : wel lhealed incision. No erythema, fluctuance. Skin: Skin is warm and dry. No rash noted. No erythema.  Psychiatric: a normal mood and affect. His behavior is normal.    Lab Results  Component Value Date   ESRSEDRATE 50* 05/03/2014    Lab Results  Component Value Date   CRP 2.3* 05/03/2014        Assessment & Plan:  Spinal epidural abscess, culture negatvie = continue with vancomycin and ceftriaxone x6 wk. End date will be oct 16th.  Health maintenance = to get flu shot with his family in the coming weeks  We will see him back on oct 11-14th to convert over to oral antibiotics if inflammatory markers still elevated.

## 2014-05-22 ENCOUNTER — Encounter: Payer: BC Managed Care – PPO | Admitting: Nurse Practitioner

## 2014-06-10 ENCOUNTER — Ambulatory Visit: Payer: BC Managed Care – PPO | Admitting: Infectious Diseases

## 2014-06-10 ENCOUNTER — Encounter: Payer: Self-pay | Admitting: Infectious Diseases

## 2014-06-11 ENCOUNTER — Encounter: Payer: Self-pay | Admitting: Infectious Diseases

## 2014-06-25 ENCOUNTER — Ambulatory Visit (INDEPENDENT_AMBULATORY_CARE_PROVIDER_SITE_OTHER): Payer: BC Managed Care – PPO | Admitting: Infectious Diseases

## 2014-06-25 ENCOUNTER — Telehealth: Payer: Self-pay | Admitting: *Deleted

## 2014-06-25 ENCOUNTER — Encounter: Payer: Self-pay | Admitting: Infectious Diseases

## 2014-06-25 VITALS — BP 122/81 | HR 83 | Temp 98.1°F | Wt 190.0 lb

## 2014-06-25 DIAGNOSIS — G061 Intraspinal abscess and granuloma: Secondary | ICD-10-CM | POA: Diagnosis not present

## 2014-06-25 DIAGNOSIS — Z23 Encounter for immunization: Secondary | ICD-10-CM

## 2014-06-25 LAB — C-REACTIVE PROTEIN: CRP: 0.9 mg/dL — ABNORMAL HIGH (ref <0.60)

## 2014-06-25 NOTE — Telephone Encounter (Signed)
Verbal order per Dr. Ninetta LightsHatcher given to Debbie at [facility] to pull patient's picc line on 06/30/14. Wendall MolaJacqueline Augie Vane

## 2014-06-25 NOTE — Assessment & Plan Note (Signed)
He is doing well. Will check his ESR, CRP today. Will plan to pull his PIC as scheduled on 06-30-14. He is doing well exercising more.   Will ask that his PCP repeat these in 6-12 months or as his sx suggest. Will see him back prn.

## 2014-06-25 NOTE — Progress Notes (Signed)
   Subjective:    Patient ID: Brian [name], male    DOB: Oct 15, 1965, 48 y.o.   MRN: [id number]  HPI 48 yo M with hx of back and hip pain. He underwent laminectomy March 05, 2014. Afterwards he had some wound breakdown, managed locally. He had chills at that time. He returned to the hospital on 9-4 with continued pain. His WBC was normal, his was afebrile . He had CRP 2.3 and ESR 50. He had MRI as an outpatient which showed: "significant amount of enhancement in the vertebral bodies at L4 and L5 and it as well as extensive amount of enhancing phlegmon in the epidural space causing severe stenosis and compression of both the L4 and L5 nerve roots. "  On 9-4 he underwent: "redo laminectomy at L4-5 redo discectomy L4-5 with microdissection of both the right L5 and L4 nerve roots with microscopic foraminotomies of both nerve roots and aspiration and cultures of lumbar epidural fluid collection."  He has 4 Cx's- negative.  He was d/c home on 05-06-14. He had ID f/u on 9-22 and was doing well.  He was started on vanco/ceftriaxone and will complete these on 06-30-14.  He had f/u ESR 38 at home (05-25-14). Today he states he feels like a "new person". R flank pain better, he states he is pain free. Has lost 30# due to his illness.  His PIC clogged at one point, won't draw blood out, does infuse well.   Review of Systems  Constitutional: Negative for fever and chills.  Gastrointestinal: Negative for diarrhea and constipation.  Genitourinary: Negative for difficulty urinating.       Objective:   Physical Exam  Constitutional: He appears well-developed and well-nourished.  HENT:  Mouth/Throat: No oropharyngeal exudate.  Eyes: EOM are normal. Pupils are equal, round, and reactive to light.  Neck: Neck supple.  Cardiovascular: Normal rate, regular rhythm and normal heart sounds.   Pulmonary/Chest: Effort normal and breath sounds normal.  Abdominal: Soft. Bowel sounds are normal. There is no tenderness.  There is no rebound.  Musculoskeletal:       Arms: Lymphadenopathy:    He has no cervical adenopathy.          Assessment & Plan:

## 2014-06-26 LAB — SEDIMENTATION RATE: Sed Rate: 10 mm/hr (ref 0–16)

## 2014-06-27 ENCOUNTER — Telehealth: Payer: Self-pay | Admitting: Infectious Diseases

## 2014-06-27 NOTE — Telephone Encounter (Signed)
calle dpt to let him know of results of CRP and ESR.

## 2014-07-02 ENCOUNTER — Encounter: Payer: Self-pay | Admitting: Infectious Diseases

## 2014-08-20 ENCOUNTER — Encounter: Payer: Self-pay | Admitting: Infectious Diseases

## 2014-08-27 ENCOUNTER — Encounter: Payer: Self-pay | Admitting: Infectious Diseases

## 2015-09-15 ENCOUNTER — Ambulatory Visit (INDEPENDENT_AMBULATORY_CARE_PROVIDER_SITE_OTHER): Payer: BLUE CROSS/BLUE SHIELD

## 2015-09-15 ENCOUNTER — Encounter: Payer: BLUE CROSS/BLUE SHIELD | Admitting: Nurse Practitioner

## 2015-09-15 DIAGNOSIS — R079 Chest pain, unspecified: Secondary | ICD-10-CM | POA: Diagnosis not present

## 2015-09-15 DIAGNOSIS — Z8249 Family history of ischemic heart disease and other diseases of the circulatory system: Secondary | ICD-10-CM

## 2015-09-15 LAB — EXERCISE TOLERANCE TEST
Estimated workload: 10.1 METS
Exercise duration (min): 9 min
Exercise duration (sec): 0 s
MPHR: 170 {beats}/min
Peak HR: 164 {beats}/min
Percent HR: 96 %
RPE: 13
Rest HR: 90 {beats}/min

## 2015-12-08 DIAGNOSIS — E782 Mixed hyperlipidemia: Secondary | ICD-10-CM | POA: Diagnosis not present

## 2015-12-08 DIAGNOSIS — Z0000 Encounter for general adult medical examination without abnormal findings: Secondary | ICD-10-CM | POA: Diagnosis not present

## 2015-12-08 DIAGNOSIS — E119 Type 2 diabetes mellitus without complications: Secondary | ICD-10-CM | POA: Diagnosis not present

## 2015-12-25 DIAGNOSIS — M549 Dorsalgia, unspecified: Secondary | ICD-10-CM | POA: Diagnosis not present

## 2015-12-25 DIAGNOSIS — M4726 Other spondylosis with radiculopathy, lumbar region: Secondary | ICD-10-CM | POA: Diagnosis not present

## 2015-12-25 DIAGNOSIS — M5416 Radiculopathy, lumbar region: Secondary | ICD-10-CM | POA: Diagnosis not present

## 2015-12-25 DIAGNOSIS — M961 Postlaminectomy syndrome, not elsewhere classified: Secondary | ICD-10-CM | POA: Diagnosis not present

## 2015-12-25 DIAGNOSIS — M5432 Sciatica, left side: Secondary | ICD-10-CM | POA: Diagnosis not present

## 2016-01-01 DIAGNOSIS — M5136 Other intervertebral disc degeneration, lumbar region: Secondary | ICD-10-CM | POA: Diagnosis not present

## 2016-01-01 DIAGNOSIS — M5416 Radiculopathy, lumbar region: Secondary | ICD-10-CM | POA: Diagnosis not present

## 2016-01-01 DIAGNOSIS — M4726 Other spondylosis with radiculopathy, lumbar region: Secondary | ICD-10-CM | POA: Diagnosis not present

## 2016-01-01 DIAGNOSIS — M5432 Sciatica, left side: Secondary | ICD-10-CM | POA: Diagnosis not present

## 2017-02-22 DIAGNOSIS — E119 Type 2 diabetes mellitus without complications: Secondary | ICD-10-CM | POA: Diagnosis not present

## 2017-02-22 DIAGNOSIS — E782 Mixed hyperlipidemia: Secondary | ICD-10-CM | POA: Diagnosis not present

## 2017-02-22 DIAGNOSIS — R221 Localized swelling, mass and lump, neck: Secondary | ICD-10-CM | POA: Diagnosis not present

## 2017-07-26 ENCOUNTER — Ambulatory Visit (INDEPENDENT_AMBULATORY_CARE_PROVIDER_SITE_OTHER): Payer: BLUE CROSS/BLUE SHIELD

## 2017-07-26 ENCOUNTER — Ambulatory Visit: Payer: BLUE CROSS/BLUE SHIELD | Admitting: Podiatry

## 2017-07-26 ENCOUNTER — Encounter: Payer: Self-pay | Admitting: Podiatry

## 2017-07-26 DIAGNOSIS — L84 Corns and callosities: Secondary | ICD-10-CM

## 2017-07-26 DIAGNOSIS — B079 Viral wart, unspecified: Secondary | ICD-10-CM

## 2017-07-26 DIAGNOSIS — B070 Plantar wart: Secondary | ICD-10-CM | POA: Diagnosis not present

## 2017-07-26 NOTE — Progress Notes (Signed)
Subjective:    Patient ID: Brian HeckleWilliam W [name], male   DOB: 51 y.o.   MRN: [id number]   HPI patient presents with painful callus under the left foot that's making walking difficult and he does not remember specific injury. Patient does not smoke and likes to be very active    ROS      Objective:  Physical Exam neurovascular status intact with keratotic lesion sub-left distal second metatarsal interspace that upon debridement shows pinpoint bleeding pain to palpation with pressure application from the medial and lateral side. Patient's noted to have good digital perfusion well oriented 3     Assessment:   Probable verruca plantaris plantar aspect left metatarsal      Plan:  H&P condition reviewed and recommended excision and pathology. I explained procedure and risk and he wants this done and the fact it may recur. I went ahead and injected with 60 mg Xylocaine with epinephrine and under sterile conditions I excised the mass and sent this off for pathological evaluation applying sterile phenol to the base. I then applied sterile dressing and instructed on soaks to begin in the next several days

## 2017-07-26 NOTE — Patient Instructions (Signed)
Warts Warts are small growths on the skin. They are common, and they are caused by a type of germ (virus). Warts can occur on many areas of the body. A person may have one wart or more than one wart. Warts can spread if you scratch a wart and then scratch normal skin. Most warts will go away over many months to a couple years. Treatments may be done if needed. Follow these instructions at home:  Apply over-the-counter and prescription medicines only as told by your doctor.  Do not apply over-the-counter wart medicines to your face or genitals before you ask your doctor if it is okay to do that.  Do not scratch or pick at a wart.  Wash your hands after you touch a wart.  Avoid shaving hair that is over a wart.  Keep all follow-up visits as told by your doctor. This is important. Contact a doctor if:  Your warts do not improve after treatment.  You have redness, swelling, or pain at the site of a wart.  You have bleeding from a wart, and the bleeding does not stop when you put light pressure on the wart.  You have diabetes and you get a wart. This information is not intended to replace advice given to you by your health care provider. Make sure you discuss any questions you have with your health care provider. Document Released: 12/16/2010 Document Revised: 01/21/2016 Document Reviewed: 11/10/2014 Elsevier Interactive Patient Education  2018 Elsevier Inc.  

## 2017-07-26 NOTE — Progress Notes (Signed)
   Subjective:    Patient ID: Brian [name], male    DOB: 10/09/1965, 51 y.o.   MRN: [id number]  HPI    Review of Systems  All other systems reviewed and are negative.      Objective:   Physical Exam        Assessment & Plan:

## 2017-08-24 DIAGNOSIS — E119 Type 2 diabetes mellitus without complications: Secondary | ICD-10-CM | POA: Diagnosis not present

## 2017-08-24 DIAGNOSIS — E782 Mixed hyperlipidemia: Secondary | ICD-10-CM | POA: Diagnosis not present

## 2017-11-23 DIAGNOSIS — R05 Cough: Secondary | ICD-10-CM | POA: Diagnosis not present

## 2017-11-23 DIAGNOSIS — J111 Influenza due to unidentified influenza virus with other respiratory manifestations: Secondary | ICD-10-CM | POA: Diagnosis not present

## 2017-11-23 DIAGNOSIS — J069 Acute upper respiratory infection, unspecified: Secondary | ICD-10-CM | POA: Diagnosis not present

## 2018-03-16 DIAGNOSIS — Z0000 Encounter for general adult medical examination without abnormal findings: Secondary | ICD-10-CM | POA: Diagnosis not present

## 2018-03-16 DIAGNOSIS — E119 Type 2 diabetes mellitus without complications: Secondary | ICD-10-CM | POA: Diagnosis not present

## 2018-03-16 DIAGNOSIS — M25519 Pain in unspecified shoulder: Secondary | ICD-10-CM | POA: Diagnosis not present

## 2018-03-16 DIAGNOSIS — Z23 Encounter for immunization: Secondary | ICD-10-CM | POA: Diagnosis not present

## 2018-03-16 DIAGNOSIS — E782 Mixed hyperlipidemia: Secondary | ICD-10-CM | POA: Diagnosis not present

## 2018-03-21 DIAGNOSIS — M19211 Secondary osteoarthritis, right shoulder: Secondary | ICD-10-CM | POA: Diagnosis not present

## 2018-03-29 ENCOUNTER — Telehealth: Payer: Self-pay

## 2018-03-29 NOTE — Telephone Encounter (Signed)
   [facility] Pre-operative Risk Assessment    Request for surgical clearance:  1. What type of surgery is being performed? Right Total Shoulder Arthroplasty   2. When is this surgery scheduled? 04/17/18   3. What type of clearance is required (medical clearance vs. Pharmacy clearance to hold med vs. Both)? Both   4. Are there any medications that need to be held prior to surgery and how long? Aspirin   5. Practice name and name of physician performing surgery? [facility] Dr. Tania Ade   6. What is your office phone number [phone]   7.   What is your office fax number [phone]  8.   Anesthesia type (None, local, MAC, general) ? Choice   Brian [name] 03/29/2018, 2:13 PM  _________________________________________________________________

## 2018-03-30 NOTE — Telephone Encounter (Signed)
   Primary Cardiologist: Remotely saw Dr. Eden EmmsNishan in 2015  Chart reviewed as part of pre-operative protocol coverage. Because of Brian [name]'s past medical history and time since last visit, he/she will require a follow-up visit in order to better assess preoperative cardiovascular risk. This patient has not been seen in our clinic since 2015 so we are unable to provide clearance recommendations without patient being seen.  Pre-op covering staff: - Please schedule appointment and call patient to inform them. - Please contact requesting surgeon's office via preferred method (i.e, phone, fax) to inform them of need for appointment prior to surgery.  Brian Montanaayna N Berman Grainger, PA-C  03/30/2018, 11:31 AM

## 2018-03-30 NOTE — Telephone Encounter (Signed)
Left pt a detailed message that he would need a "new pt" appt since he hasn't been seen since 12/2013, in order to get surgical cleared. Left message for pt to call and schedule that appt.

## 2018-04-02 NOTE — Telephone Encounter (Signed)
° °  Patient called , declined 04/03/18 appt with Eden EmmsNishan, declined next available with APP.

## 2018-04-03 ENCOUNTER — Other Ambulatory Visit: Payer: Self-pay | Admitting: Orthopedic Surgery

## 2018-04-03 DIAGNOSIS — M19211 Secondary osteoarthritis, right shoulder: Secondary | ICD-10-CM

## 2018-04-05 DIAGNOSIS — Z01818 Encounter for other preprocedural examination: Secondary | ICD-10-CM | POA: Diagnosis not present

## 2018-04-05 DIAGNOSIS — M25819 Other specified joint disorders, unspecified shoulder: Secondary | ICD-10-CM | POA: Diagnosis not present

## 2018-04-10 DIAGNOSIS — M25511 Pain in right shoulder: Secondary | ICD-10-CM | POA: Diagnosis not present

## 2018-04-11 ENCOUNTER — Ambulatory Visit
Admission: RE | Admit: 2018-04-11 | Discharge: 2018-04-11 | Disposition: A | Discharge status: Home / Self Care | Payer: BLUE CROSS/BLUE SHIELD | Source: Ambulatory Visit | Attending: Orthopedic Surgery | Admitting: Orthopedic Surgery

## 2018-04-11 DIAGNOSIS — M19211 Secondary osteoarthritis, right shoulder: Secondary | ICD-10-CM

## 2018-04-11 DIAGNOSIS — M19011 Primary osteoarthritis, right shoulder: Secondary | ICD-10-CM | POA: Diagnosis not present

## 2018-04-11 DIAGNOSIS — Z01818 Encounter for other preprocedural examination: Secondary | ICD-10-CM | POA: Diagnosis not present

## 2018-04-13 DIAGNOSIS — M19011 Primary osteoarthritis, right shoulder: Secondary | ICD-10-CM | POA: Diagnosis not present

## 2018-04-17 DIAGNOSIS — M19011 Primary osteoarthritis, right shoulder: Secondary | ICD-10-CM | POA: Diagnosis not present

## 2018-05-04 DIAGNOSIS — M19011 Primary osteoarthritis, right shoulder: Secondary | ICD-10-CM | POA: Diagnosis not present

## 2018-05-24 DIAGNOSIS — Z23 Encounter for immunization: Secondary | ICD-10-CM | POA: Diagnosis not present

## 2018-09-25 DIAGNOSIS — E119 Type 2 diabetes mellitus without complications: Secondary | ICD-10-CM | POA: Diagnosis not present

## 2018-10-11 ENCOUNTER — Ambulatory Visit: Payer: 59 | Admitting: Podiatry

## 2018-10-11 ENCOUNTER — Encounter: Payer: Self-pay | Admitting: Podiatry

## 2018-10-11 DIAGNOSIS — B079 Viral wart, unspecified: Secondary | ICD-10-CM | POA: Diagnosis not present

## 2018-10-11 NOTE — Progress Notes (Signed)
Subjective:   Patient ID: Brian [name], male   DOB: 53 y.o.   MRN: [id number]   HPI Patient presents with lesion plantar aspect left foot that is been painful and making walking difficult.  Had one on the right foot that states did well for him   ROS      Objective:  Physical Exam  Lesion formation plantar left that upon debridement shows pinpoint bleeding measuring about 8 x 8 mm with moderate pain to lateral pressure.  Patient's found in good digital perfusion well oriented x3     Assessment:  Keratotic lesion with verruca component left     Plan:  H&P education rendered and at this point I do think debridement will be necessary along with chemical agent which was applied creating an immune response.  Reappoint to recheck

## 2019-11-14 ENCOUNTER — Telehealth: Payer: Self-pay | Admitting: Cardiovascular Disease

## 2019-11-14 NOTE — Telephone Encounter (Signed)
Left message for patient to call back. Cannot call patient's wife since she is not on a DPR for our office.

## 2019-11-14 NOTE — Telephone Encounter (Signed)
New message  Patient's wife is calling in to have order for stress test put in for patient. States that every few years patient is to have a stress test. Please assist.

## 2019-11-27 NOTE — Telephone Encounter (Signed)
Patient has new patient appointment with Dr. Eden Emms since he has not been seen since 2015.

## 2020-01-23 NOTE — Progress Notes (Signed)
CARDIOLOGY CONSULT NOTE       Patient ID: Brian [name] MRN: [id number] DOB/AGE: 55-Jul-1967 55 y.o.  Admit date: (Not on file) Referring Physician: Tenny Craw Primary Physician: Daisy Floro, MD Primary Cardiologist: New/Holger Sokolowski Reason for Consultation: CAD  Active Problems:   * No active hospital problems. *   HPI:  54 y.o. last seen by cardiology in 2015. History of GERD, HLD and since we last saw him DM-2.  Father and uncle had premature CAD I took care of his dad who had CABG. Had normal ETT 2015 Calcium score was 16 involving the proximal and mid LAD This was 73 rd percentile for age/sex.  His twins are now 55 yo one in [place] doing mission work and daughter in [place] doing Comptroller  Lab review with Hct 41, A1c 6.3 Cr 1.04 Normal LfTls  LDL 84   He had a lot of job stress working for [employer] but better now Diet and exercise poor but getting Better now since diagnosis of DM> Father passed 3 years ago He does get some tightness in chest when Using his recumbent bike No rest pain     ROS All other systems reviewed and negative except as noted above  Past Medical History:  Diagnosis Date  . GERD (gastroesophageal reflux disease)    hx. reflux.  . Hyperlipidemia    tx. with meds(high triglycerides)  . Ventral hernia     Family History  Problem Relation Age of Onset  . Heart disease Father   . Heart disease Paternal Uncle     Social History   Socioeconomic History  . Marital status: Married    Spouse name: Not on file  . Number of children: Not on file  . Years of education: Not on file  . Highest education level: Not on file  Occupational History  . Not on file  Tobacco Use  . Smoking status: Never Smoker  . Smokeless tobacco: Never Used  Substance and Sexual Activity  . Alcohol use: No  . Drug use: No  . Sexual activity: Not Currently  Other Topics Concern  . Not on file  Social History Narrative  . Not on file   Social  Determinants of Health   Financial Resource Strain:   . Difficulty of Paying Living Expenses:   Food Insecurity:   . Worried About Programme researcher, broadcasting/film/video in the Last Year:   . Barista in the Last Year:   Transportation Needs:   . Freight forwarder (Medical):   Marland Kitchen Lack of Transportation (Non-Medical):   Physical Activity:   . Days of Exercise per Week:   . Minutes of Exercise per Session:   Stress:   . Feeling of Stress :   Social Connections:   . Frequency of Communication with Friends and Family:   . Frequency of Social Gatherings with Friends and Family:   . Attends Religious Services:   . Active Member of Clubs or Organizations:   . Attends Banker Meetings:   Marland Kitchen Marital Status:   Intimate Partner Violence:   . Fear of Current or Ex-Partner:   . Emotionally Abused:   Marland Kitchen Physically Abused:   . Sexually Abused:     Past Surgical History:  Procedure Laterality Date  . ADENOIDECTOMY  ` 1972  . BACK SURGERY    . HERNIA REPAIR    . LAMINECTOMY AND MICRODISCECTOMY LUMBAR SPINE  05/02/2014   redo  . LUMBAR LAMINECTOMY FOR EPIDURAL  ABSCESS N/A 05/02/2014   Procedure: LUMBAR LAMINECTOMY FOR EPIDURAL ABSCESS, lumbar four-five right;  Surgeon: Elaina Hoops, MD;  Location: [facility];  Service: Neurosurgery;  Laterality: N/A;  . MICRODISCECTOMY LUMBAR  10/2001   "L3-4"  . ORIF CLAVICULAR FRACTURE Left 07/1988  . POSTERIOR LAMINECTOMY / DECOMPRESSION LUMBAR SPINE  03/07/2014   "L4-5"  . UMBILICAL HERNIA REPAIR  ~ 2011  . VENTRAL HERNIA REPAIR  07/27/2011   Procedure: LAPAROSCOPIC VENTRAL HERNIA;  Surgeon: Edward Jolly, MD;  Location: [facility];  Service: General;  Laterality: N/A;  . WRIST FRACTURE SURGERY Left 07/1988   with retained hardware      Current Outpatient Medications:  .  aspirin 325 MG tablet, Take 325 mg by mouth daily. PAIN , Disp: , Rfl:  .  atorvastatin (LIPITOR) 20 MG tablet, TAKE 1 TABLET EVERY OTHER DAY ORALLY 30 DAY(S), Disp: , Rfl:      Physical Exam: Blood pressure 120/90, pulse 70, height 6' (1.829 m), weight 202 lb (91.6 kg), SpO2 98 %.   Affect appropriate Healthy:  appears stated age 82: normal Neck supple with no adenopathy JVP normal no bruits no thyromegaly Lungs clear with no wheezing and good diaphragmatic motion Heart:  S1/S2 no murmur, no rub, gallop or click PMI normal Abdomen: benighn, BS positve, no tenderness, no AAA no bruit.  No HSM or HJR Distal pulses intact with no bruits No edema Neuro non-focal Skin warm and dry No muscular weakness   Labs:   Lab Results  Component Value Date   WBC 10.5 05/02/2014   HGB 13.4 05/02/2014   HCT 38.4 (L) 05/02/2014   MCV 87.1 05/02/2014   PLT 363 05/02/2014      Radiology: No results found.  EKG: May 2015 NSR normal ECG 01/29/20 SR rate 70 normal    ASSESSMENT AND PLAN:   1. CAD:  Risk Assessment. Family history HLD, and DM-2 High calcium score for age 72 involving proximal and mid LAD F/U cardiac CT with calcium score ordered   2. DM:  Discussed low carb diet.  Target hemoglobin A1c is 6.5 or less.  Continue current medications.  3. HLD:  Continue statin labs with Dr Harrington Challenger Target LdL 70 or less   Signed: Jenkins Rouge 01/29/2020, 9:36 AM

## 2020-01-29 ENCOUNTER — Ambulatory Visit: Payer: 59 | Admitting: Cardiovascular Disease

## 2020-01-29 ENCOUNTER — Other Ambulatory Visit: Payer: Self-pay

## 2020-01-29 ENCOUNTER — Encounter: Payer: Self-pay | Admitting: Cardiovascular Disease

## 2020-01-29 VITALS — BP 120/90 | HR 70 | Ht 72.0 in | Wt 202.0 lb

## 2020-01-29 DIAGNOSIS — I2510 Atherosclerotic heart disease of native coronary artery without angina pectoris: Secondary | ICD-10-CM | POA: Diagnosis not present

## 2020-01-29 LAB — BASIC METABOLIC PANEL
BUN/Creatinine Ratio: 16 (ref 9–20)
BUN: 16 mg/dL (ref 6–24)
CO2: 21 mmol/L (ref 20–29)
Calcium: 9.2 mg/dL (ref 8.7–10.2)
Chloride: 98 mmol/L (ref 96–106)
Creatinine, Ser: 1 mg/dL (ref 0.76–1.27)
GFR calc Af Amer: 98 mL/min/{1.73_m2} (ref >59)
GFR calc non Af Amer: 84 mL/min/{1.73_m2} (ref >59)
Glucose: 158 mg/dL — ABNORMAL HIGH (ref 65–99)
Potassium: 4 mmol/L (ref 3.5–5.2)
Sodium: 137 mmol/L (ref 134–144)

## 2020-01-29 MED ORDER — ASPIRIN EC 81 MG PO TBEC: 81.0000 mg | DELAYED_RELEASE_TABLET | Freq: Every day | ORAL | Status: DC

## 2020-01-29 MED ORDER — METOPROLOL TARTRATE 50 MG PO TABS: ORAL_TABLET | ORAL | 0 refills | Status: DC

## 2020-01-29 NOTE — Patient Instructions (Addendum)
Medication Instructions:  Your physician has recommended you make the following change in your medication:  1-Decrease Aspirin 81 mg by mouth daily  *If you need a refill on your cardiac medications before your next appointment, please call your pharmacy*  Lab Work: Your physician recommends that you have lab work today- BMET  If you have labs (blood work) drawn today and your tests are completely normal, you will receive your results only by: Marland Kitchen MyChart Message (if you have MyChart) OR . A paper copy in the mail If you have any lab test that is abnormal or we need to change your treatment, we will call you to review the results.  Testing/Procedures: Your physician has requested that you have cardiac CT. Cardiac computed tomography (CT) is a painless test that uses an x-ray machine to take clear, detailed pictures of your heart. For further information please visit [email or web address]. Please follow instruction sheet as given.  Follow-Up: At [facility], you and your health needs are our priority.  As part of our continuing mission to provide you with exceptional heart care, we have created designated Provider Care Teams.  These Care Teams include your primary Cardiologist (physician) and Advanced Practice Providers (APPs -  Physician Assistants and Nurse Practitioners) who all work together to provide you with the care you need, when you need it.  We recommend signing up for the patient portal called "MyChart".  Sign up information is provided on this After Visit Summary.  MyChart is used to connect with patients for Virtual Visits (Telemedicine).  Patients are able to view lab/test results, encounter notes, upcoming appointments, etc.  Non-urgent messages can be sent to your provider as well.   To learn more about what you can do with MyChart, go to [email or web address].    Your next appointment:   1 year(s)  The format for your next appointment:   In Person  Provider:   You may  see Dr. Johnsie Cancel or one of the following Advanced Practice Providers on your designated Care Team:    Truitt Merle, NP  Cecilie Kicks, NP  Kathyrn Drown, NP   Your cardiac CT will be scheduled at one of the below locations:   [facility] [address] [place], [place] [address] [phone]  If scheduled at [facility], please arrive at the [facility] main entrance of [facility] 30 minutes prior to test start time. Proceed to the [facility] Radiology Department (first floor) to check-in and test prep.  Please follow these instructions carefully (unless otherwise directed):  Hold all erectile dysfunction medications at least 3 days (72 hrs) prior to test.  On the Night Before the Test: . Be sure to Drink plenty of water. . Do not consume any caffeinated/decaffeinated beverages or chocolate 12 hours prior to your test. . Do not take any antihistamines 12 hours prior to your test. . Take metoprolol 50 mg by mouth the night before test  On the Day of the Test: . Drink plenty of water. Do not drink any water within one hour of the test. . Do not eat any food 4 hours prior to the test. . You may take your regular medications prior to the test.  . Take metoprolol (Lopressor) 50 mg  two hours prior to test  After the Test: . Drink plenty of water. . After receiving IV contrast, you may experience a mild flushed feeling. This is normal. . On occasion, you may experience a mild rash up to  24 hours after the test. This is not dangerous. If this occurs, you can take Benadryl 25 mg and increase your fluid intake. . If you experience trouble breathing, this can be serious. If it is severe call 911 IMMEDIATELY. If it is mild, please call our office.  Once we have confirmed authorization from your insurance company, we will call you to set up a date and time for your test.   For non-scheduling related questions, please contact the cardiac imaging nurse  navigator should you have any questions/concerns: Marchia Bond, Cardiac Imaging Nurse Navigator Burley Saver, Interim Cardiac Imaging Nurse [facility] Direct Office Dial: [phone]   For scheduling needs, including cancellations and rescheduling, please call [phone].

## 2020-02-24 ENCOUNTER — Telehealth (HOSPITAL_COMMUNITY): Payer: Self-pay | Admitting: *Deleted

## 2020-02-24 NOTE — Telephone Encounter (Signed)
Attempted to call patient regarding upcoming cardiac CT appointment. Left message on voicemail with name and callback number  Shannon Hills RN Navigator Cardiac [facility] [phone] Office [phone] Cell

## 2020-02-25 ENCOUNTER — Other Ambulatory Visit: Payer: Self-pay

## 2020-02-25 ENCOUNTER — Encounter (HOSPITAL_COMMUNITY): Payer: Self-pay

## 2020-02-25 ENCOUNTER — Ambulatory Visit (HOSPITAL_COMMUNITY)
Admission: RE | Admit: 2020-02-25 | Discharge: 2020-02-25 | Disposition: A | Discharge status: Home / Self Care | Payer: 59 | Source: Ambulatory Visit | Attending: Cardiovascular Disease | Admitting: Cardiovascular Disease

## 2020-02-25 DIAGNOSIS — I2510 Atherosclerotic heart disease of native coronary artery without angina pectoris: Secondary | ICD-10-CM

## 2020-02-25 MED ORDER — NITROGLYCERIN 0.4 MG SL SUBL
SUBLINGUAL_TABLET | SUBLINGUAL | Status: AC
  Filled 2020-02-25: qty 2

## 2020-02-25 MED ORDER — NITROGLYCERIN 0.4 MG SL SUBL
0.8000 mg | SUBLINGUAL_TABLET | Freq: Once | SUBLINGUAL | Status: AC
  Administered 2020-02-25: 0.8 mg via SUBLINGUAL

## 2020-02-25 MED ORDER — IOHEXOL 350 MG/ML SOLN
80.0000 mL | Freq: Once | INTRAVENOUS | Status: AC | PRN
  Administered 2020-02-25: 80 mL via INTRAVENOUS

## 2020-02-26 ENCOUNTER — Ambulatory Visit (HOSPITAL_COMMUNITY)
Admission: RE | Admit: 2020-02-26 | Discharge: 2020-02-26 | Disposition: A | Discharge status: Home / Self Care | Payer: 59 | Source: Ambulatory Visit | Attending: Cardiovascular Disease | Admitting: Cardiovascular Disease

## 2020-02-26 DIAGNOSIS — I2510 Atherosclerotic heart disease of native coronary artery without angina pectoris: Secondary | ICD-10-CM | POA: Diagnosis not present

## 2020-03-03 ENCOUNTER — Telehealth: Payer: Self-pay | Admitting: Cardiovascular Disease

## 2020-03-03 NOTE — Telephone Encounter (Signed)
Brian [name] is returning [name]'s call in regards to CT results. Please advise.

## 2020-03-04 NOTE — Telephone Encounter (Signed)
Called patient with results.  

## 2020-06-25 ENCOUNTER — Ambulatory Visit: Payer: 59 | Admitting: Infectious Disease

## 2020-06-30 ENCOUNTER — Encounter: Payer: Self-pay | Admitting: Infectious Disease

## 2020-06-30 ENCOUNTER — Other Ambulatory Visit: Payer: Self-pay

## 2020-06-30 ENCOUNTER — Ambulatory Visit (INDEPENDENT_AMBULATORY_CARE_PROVIDER_SITE_OTHER): Payer: 59 | Admitting: Infectious Disease

## 2020-06-30 VITALS — BP 121/86 | HR 77 | Wt 206.0 lb

## 2020-06-30 DIAGNOSIS — N390 Urinary tract infection, site not specified: Secondary | ICD-10-CM | POA: Diagnosis not present

## 2020-06-30 DIAGNOSIS — R319 Hematuria, unspecified: Secondary | ICD-10-CM

## 2020-06-30 DIAGNOSIS — G061 Intraspinal abscess and granuloma: Secondary | ICD-10-CM

## 2020-06-30 DIAGNOSIS — K436 Other and unspecified ventral hernia with obstruction, without gangrene: Secondary | ICD-10-CM | POA: Diagnosis not present

## 2020-06-30 DIAGNOSIS — E781 Pure hyperglyceridemia: Secondary | ICD-10-CM | POA: Diagnosis not present

## 2020-06-30 HISTORY — DX: Urinary tract infection, site not specified: N39.0

## 2020-06-30 NOTE — Progress Notes (Signed)
Subjective:    Reason for Infectious Disease Consult: recurrent UTI's  Requesting Physician: Duane Lope    Patient ID: Brian [name], male    DOB: 03-Aug-1966, 54 y.o.   MRN: [id number]  HPI   Brian [name] is a 54 year old Caucasian man who has previously been treated for an epidural abscess by my partner Dr. Ninetta Lights in 2015.  Over the past 6 months he has been suffering from clearly symptomatic urinary tract infections.  He recalls this beginning at one point after he had been holding his urine for protracted time during a board meeting.  He then began to have dysuria malaise and profound fatigue.  He has been treated with various antibiotics including Keflex Macrobid and more recently Bactrim.  He tells me that when he comes off of antibiotics his urine becomes a milky color and he develops dysuria at times flank pain which can be bilateral as well as profound fatigue and malaise.  He also told me that while he was getting Macrobid from urology (he saw Dr. Hillis Range) that he developed night sweats and arthralgias and could not tolerate this antibiotic.  The culture data from his primary care physician Dr. Tenny Craw showed fairly pansensitive E. coli which was sensitive to all antibiotics that it was tested against.  Reportedly he grew also at at urology's office a and E. coli and a staph species though this was not when he had symptoms.  He is currently on Bactrim which has been taking twice a day with plan of having nearly 45 days of therapy.  He has had an ultrasound with [facility] which was reportedly normal other than a few cysts on the kidney.  He does not have a history of Crohn's disease or ulcerative colitis.  He has not had intra-abdominal surgeries other than repair of her hernia with mesh placement.     Past Medical History:  Diagnosis Date  . GERD (gastroesophageal reflux disease)    hx. reflux.  . Hyperlipidemia    tx. with meds(high triglycerides)  .  Recurrent UTI 06/30/2020  . Ventral hernia     Past Surgical History:  Procedure Laterality Date  . ADENOIDECTOMY  ` 1972  . BACK SURGERY    . HERNIA REPAIR    . LAMINECTOMY AND MICRODISCECTOMY LUMBAR SPINE  05/02/2014   redo  . LUMBAR LAMINECTOMY FOR EPIDURAL ABSCESS N/A 05/02/2014   Procedure: LUMBAR LAMINECTOMY FOR EPIDURAL ABSCESS, lumbar four-five right;  Surgeon: Mariam Dollar, MD;  Location: [facility];  Service: Neurosurgery;  Laterality: N/A;  . MICRODISCECTOMY LUMBAR  10/2001   "L3-4"  . ORIF CLAVICULAR FRACTURE Left 07/1988  . POSTERIOR LAMINECTOMY / DECOMPRESSION LUMBAR SPINE  03/07/2014   "L4-5"  . UMBILICAL HERNIA REPAIR  ~ 2011  . VENTRAL HERNIA REPAIR  07/27/2011   Procedure: LAPAROSCOPIC VENTRAL HERNIA;  Surgeon: Mariella Saa, MD;  Location: [facility];  Service: General;  Laterality: N/A;  . WRIST FRACTURE SURGERY Left 07/1988   with retained hardware    Family History  Problem Relation Age of Onset  . Heart disease Father   . Heart disease Paternal Uncle       Social History   Socioeconomic History  . Marital status: Married    Spouse name: Not on file  . Number of children: Not on file  . Years of education: Not on file  . Highest education level: Not on file  Occupational History  . Not on file  Tobacco Use  .  Smoking status: Never Smoker  . Smokeless tobacco: Never Used  Substance and Sexual Activity  . Alcohol use: No  . Drug use: No  . Sexual activity: Not Currently  Other Topics Concern  . Not on file  Social History Narrative  . Not on file   Social Determinants of Health   Financial Resource Strain:   . Difficulty of Paying Living Expenses: Not on file  Food Insecurity:   . Worried About Programme researcher, broadcasting/film/video in the Last Year: Not on file  . Ran Out of Food in the Last Year: Not on file  Transportation Needs:   . Lack of Transportation (Medical): Not on file  . Lack of Transportation (Non-Medical): Not on file  Physical Activity:    . Days of Exercise per Week: Not on file  . Minutes of Exercise per Session: Not on file  Stress:   . Feeling of Stress : Not on file  Social Connections:   . Frequency of Communication with Friends and Family: Not on file  . Frequency of Social Gatherings with Friends and Family: Not on file  . Attends Religious Services: Not on file  . Active Member of Clubs or Organizations: Not on file  . Attends Banker Meetings: Not on file  . Marital Status: Not on file    Allergies  Allergen Reactions  . Morphine Nausea And Vomiting and Other (See Comments)     Current Outpatient Medications:  .  aspirin EC 81 MG tablet, Take 1 tablet (81 mg total) by mouth daily., Disp:  , Rfl:  .  atorvastatin (LIPITOR) 20 MG tablet, TAKE 1 TABLET EVERY OTHER DAY ORALLY 30 DAY(S), Disp: , Rfl:  .  sulfamethoxazole-trimethoprim (BACTRIM DS) 800-160 MG tablet, Take by mouth., Disp: , Rfl:  .  metoprolol tartrate (LOPRESSOR) 50 MG tablet, Take one tablet by mouth night before CT and one tablet by mouth 2 hours prior to CT (Patient not taking: Reported on 06/30/2020), Disp: 2 tablet, Rfl: 0   Review of Systems  Constitutional: Negative for activity change, appetite change, chills, diaphoresis, fatigue, fever and unexpected weight change.  HENT: Negative for congestion, rhinorrhea, sinus pressure, sneezing, sore throat and trouble swallowing.   Eyes: Negative for photophobia and visual disturbance.  Respiratory: Negative for cough, chest tightness, shortness of breath, wheezing and stridor.   Cardiovascular: Negative for chest pain, palpitations and leg swelling.  Gastrointestinal: Negative for abdominal distention, abdominal pain, anal bleeding, blood in stool, constipation, diarrhea, nausea and vomiting.  Genitourinary: Negative for difficulty urinating, dysuria, flank pain and hematuria.  Musculoskeletal: Negative for arthralgias, back pain, gait problem, joint swelling and myalgias.  Skin:  Negative for color change, pallor, rash and wound.  Neurological: Negative for dizziness, tremors, weakness and light-headedness.  Hematological: Negative for adenopathy. Does not bruise/bleed easily.  Psychiatric/Behavioral: Negative for agitation, behavioral problems, confusion, decreased concentration, dysphoric mood and sleep disturbance.       Objective:   Physical Exam Constitutional:      General: He is not in acute distress.    Appearance: Normal appearance. He is well-developed. He is not ill-appearing or diaphoretic.  HENT:     Head: Normocephalic and atraumatic.     Right Ear: Hearing and external ear normal.     Left Ear: Hearing and external ear normal.     Nose: No nasal deformity or rhinorrhea.  Eyes:     General: No scleral icterus.    Extraocular Movements: Extraocular movements intact.  Conjunctiva/sclera: Conjunctivae normal.     Right eye: Right conjunctiva is not injected.     Left eye: Left conjunctiva is not injected.  Neck:     Vascular: No JVD.  Cardiovascular:     Rate and Rhythm: Normal rate and regular rhythm.     Heart sounds: S1 normal and S2 normal.  Abdominal:     General: There is no distension.  Musculoskeletal:        General: Normal range of motion.     Right shoulder: Normal.     Left shoulder: Normal.     Cervical back: Normal range of motion and neck supple.     Right hip: Normal.     Left hip: Normal.     Right knee: Normal.     Left knee: Normal.  Lymphadenopathy:     Head:     Right side of head: No submandibular, preauricular or posterior auricular adenopathy.     Left side of head: No submandibular, preauricular or posterior auricular adenopathy.     Cervical: No cervical adenopathy.     Right cervical: No superficial or deep cervical adenopathy.    Left cervical: No superficial or deep cervical adenopathy.  Skin:    General: Skin is warm and dry.     Coloration: Skin is not pale.     Findings: No abrasion, bruising,  ecchymosis, erythema, lesion or rash.     Nails: There is no clubbing.  Neurological:     General: No focal deficit present.     Mental Status: He is alert and oriented to person, place, and time.     Sensory: No sensory deficit.     Coordination: Coordination normal.     Gait: Gait normal.  Psychiatric:        Attention and Perception: He is attentive.        Mood and Affect: Mood normal.        Speech: Speech normal.        Behavior: Behavior normal. Behavior is cooperative.        Thought Content: Thought content normal.        Judgment: Judgment normal.           Assessment & Plan:   Recurrent urinary tract infections x6 months: His description of what his urine looks like and his systemic symptoms are bothersome.  He has not had an overt fever apparently but certainly what he describes is disconcerting.  Clearly he needs more aggressive imaging to see if there is a problem with massive amount of kidney stones or if he were to have an anterovesicular fistula or something else to explain his recurrent infections  Epidural abscess: This is resolved COVID-19 prevention he has had Covid infection but not been vaccinated I would recommend he be vaccinated with an mRNA-based vaccine received 2 doses

## 2020-07-01 LAB — COMPLETE METABOLIC PANEL WITH GFR
AG Ratio: 1.5 (calc) (ref 1.0–2.5)
ALT: 65 U/L — ABNORMAL HIGH (ref 9–46)
AST: 47 U/L — ABNORMAL HIGH (ref 10–35)
Albumin: 4.7 g/dL (ref 3.6–5.1)
Alkaline phosphatase (APISO): 78 U/L (ref 35–144)
BUN: 19 mg/dL (ref 7–25)
CO2: 26 mmol/L (ref 20–32)
Calcium: 9.5 mg/dL (ref 8.6–10.3)
Chloride: 98 mmol/L (ref 98–110)
Creat: 1.1 mg/dL (ref 0.70–1.33)
GFR, Est African American: 87 mL/min/{1.73_m2} (ref >=60)
GFR, Est Non African American: 75 mL/min/{1.73_m2} (ref >=60)
Globulin: 3.1 g/dL (calc) (ref 1.9–3.7)
Glucose, Bld: 189 mg/dL — ABNORMAL HIGH (ref 65–99)
Potassium: 4.3 mmol/L (ref 3.5–5.3)
Sodium: 134 mmol/L — ABNORMAL LOW (ref 135–146)
Total Bilirubin: 0.4 mg/dL (ref 0.2–1.2)
Total Protein: 7.8 g/dL (ref 6.1–8.1)

## 2020-07-01 LAB — CBC WITH DIFFERENTIAL/PLATELET
Absolute Monocytes: 796 cells/uL (ref 200–950)
Basophils Absolute: 58 cells/uL (ref 0–200)
Basophils Relative: 0.8 %
Eosinophils Absolute: 380 cells/uL (ref 15–500)
Eosinophils Relative: 5.2 %
HCT: 40.5 % (ref 38.5–50.0)
Hemoglobin: 14.1 g/dL (ref 13.2–17.1)
Lymphs Abs: 2460 cells/uL (ref 850–3900)
MCH: 31.6 pg (ref 27.0–33.0)
MCHC: 34.8 g/dL (ref 32.0–36.0)
MCV: 90.8 fL (ref 80.0–100.0)
MPV: 9.5 fL (ref 7.5–12.5)
Monocytes Relative: 10.9 %
Neutro Abs: 3606 cells/uL (ref 1500–7800)
Neutrophils Relative %: 49.4 %
Platelets: 238 10*3/uL (ref 140–400)
RBC: 4.46 10*6/uL (ref 4.20–5.80)
RDW: 13.3 % (ref 11.0–15.0)
Total Lymphocyte: 33.7 %
WBC: 7.3 10*3/uL (ref 3.8–10.8)

## 2020-07-17 ENCOUNTER — Ambulatory Visit (HOSPITAL_COMMUNITY)
Admission: RE | Admit: 2020-07-17 | Discharge: 2020-07-17 | Disposition: A | Discharge status: Home / Self Care | Payer: 59 | Source: Ambulatory Visit | Attending: Infectious Disease | Admitting: Infectious Disease

## 2020-07-17 ENCOUNTER — Other Ambulatory Visit: Payer: Self-pay

## 2020-07-17 DIAGNOSIS — N390 Urinary tract infection, site not specified: Secondary | ICD-10-CM | POA: Insufficient documentation

## 2020-07-17 DIAGNOSIS — R319 Hematuria, unspecified: Secondary | ICD-10-CM | POA: Insufficient documentation

## 2020-07-17 MED ORDER — IOHEXOL 300 MG/ML  SOLN
100.0000 mL | Freq: Once | INTRAMUSCULAR | Status: AC | PRN
  Administered 2020-07-17: 100 mL via INTRAVENOUS

## 2020-08-17 ENCOUNTER — Ambulatory Visit (INDEPENDENT_AMBULATORY_CARE_PROVIDER_SITE_OTHER): Payer: 59 | Admitting: Infectious Disease

## 2020-08-17 ENCOUNTER — Other Ambulatory Visit: Payer: Self-pay

## 2020-08-17 ENCOUNTER — Encounter: Payer: Self-pay | Admitting: Infectious Disease

## 2020-08-17 VITALS — BP 132/90 | HR 77 | Resp 16 | Ht 72.0 in | Wt 208.0 lb

## 2020-08-17 DIAGNOSIS — N281 Cyst of kidney, acquired: Secondary | ICD-10-CM | POA: Diagnosis not present

## 2020-08-17 DIAGNOSIS — G061 Intraspinal abscess and granuloma: Secondary | ICD-10-CM

## 2020-08-17 DIAGNOSIS — N390 Urinary tract infection, site not specified: Secondary | ICD-10-CM | POA: Diagnosis not present

## 2020-08-17 HISTORY — DX: Cyst of kidney, acquired: N28.1

## 2020-08-17 NOTE — Progress Notes (Signed)
Subjective:    Chief complaint follow-up for recurrent urinary tract infections   Patient ID: Brian [name], male    DOB: 24-Sep-1965, 54 y.o.   MRN: [id number]  HPI   Mr. Brian [name] is a 54 year old Caucasian man who has previously been treated for an epidural abscess by my partner Dr. Ninetta Lights in 2015.  Over the past 6 months he has been suffering from clearly symptomatic urinary tract infections.  He recalled this beginning at one point after he had been holding his urine for protracted time during a board meeting.  He then began to have dysuria malaise and profound fatigue.  He has been treated with various antibiotics including Keflex Macrobid and more recently Bactrim.  He told me that when he comes off of antibiotics his urine becomes a milky color and he develops dysuria at times flank pain which can be bilateral as well as profound fatigue and malaise.  He also told me that while he was getting Macrobid from urology (he saw Dr. Hillis Range) that he developed night sweats and arthralgias and could not tolerate this antibiotic.  The culture data from his primary care physician Dr. Tenny Craw showed fairly pansensitive E. coli which was sensitive to all antibiotics that it was tested against.  Reportedly he grew also at at urology's office a and E. coli and a staph species though this was not when he had symptoms.  He just finished a 45-day course of Bactrim this past weekend  He has had an ultrasound with [facility] which was reportedly normal other than a few cysts on the kidney.  He does not have a history of Crohn's disease or ulcerative colitis.  He has not had intra-abdominal surgeries other than repair of her hernia with mesh placement.   Obtained a CT of the abdomen pelvis which did show some kidney stones which could be a nidus for recurrent infections.  There is also a cystic structure mentioned in the left kidney which could be renal cyst recommendation was to perform  either renal ultrasound or MRI.  Since stop antibiotics he has not yet had the symptoms he typically has which is some dysuria changes in the urine followed by dysuria and symptoms consistent with infection.    Past Medical History:  Diagnosis Date  . GERD (gastroesophageal reflux disease)    hx. reflux.  . Hyperlipidemia    tx. with meds(high triglycerides)  . Recurrent UTI 06/30/2020  . Ventral hernia     Past Surgical History:  Procedure Laterality Date  . ADENOIDECTOMY  ` 1972  . BACK SURGERY    . HERNIA REPAIR    . LAMINECTOMY AND MICRODISCECTOMY LUMBAR SPINE  05/02/2014   redo  . LUMBAR LAMINECTOMY FOR EPIDURAL ABSCESS N/A 05/02/2014   Procedure: LUMBAR LAMINECTOMY FOR EPIDURAL ABSCESS, lumbar four-five right;  Surgeon: Mariam Dollar, MD;  Location: [facility];  Service: Neurosurgery;  Laterality: N/A;  . MICRODISCECTOMY LUMBAR  10/2001   "L3-4"  . ORIF CLAVICULAR FRACTURE Left 07/1988  . POSTERIOR LAMINECTOMY / DECOMPRESSION LUMBAR SPINE  03/07/2014   "L4-5"  . UMBILICAL HERNIA REPAIR  ~ 2011  . VENTRAL HERNIA REPAIR  07/27/2011   Procedure: LAPAROSCOPIC VENTRAL HERNIA;  Surgeon: Mariella Saa, MD;  Location: [facility];  Service: General;  Laterality: N/A;  . WRIST FRACTURE SURGERY Left 07/1988   with retained hardware    Family History  Problem Relation Age of Onset  . Heart disease Father   . Heart disease Paternal  Uncle       Social History   Socioeconomic History  . Marital status: Married    Spouse name: Not on file  . Number of children: Not on file  . Years of education: Not on file  . Highest education level: Not on file  Occupational History  . Not on file  Tobacco Use  . Smoking status: Never Smoker  . Smokeless tobacco: Never Used  Substance and Sexual Activity  . Alcohol use: No  . Drug use: No  . Sexual activity: Not Currently  Other Topics Concern  . Not on file  Social History Narrative  . Not on file   Social Determinants of Health    Financial Resource Strain: Not on file  Food Insecurity: Not on file  Transportation Needs: Not on file  Physical Activity: Not on file  Stress: Not on file  Social Connections: Not on file    Allergies  Allergen Reactions  . Macrobid [Nitrofurantoin] Other (See Comments)  . Morphine Nausea And Vomiting and Other (See Comments)  . Niacin Rash     Current Outpatient Medications:  .  aspirin EC 81 MG tablet, Take 1 tablet (81 mg total) by mouth daily., Disp:  , Rfl:  .  atorvastatin (LIPITOR) 20 MG tablet, TAKE 1 TABLET EVERY OTHER DAY ORALLY 30 DAY(S), Disp: , Rfl:  .  Cholecalciferol (VITAMIN D3) 25 MCG (1000 UT) CAPS, 1 capsule, Disp: , Rfl:  .  Multiple Vitamins-Minerals (MULTI COMPLETE) CAPS, See admin instructions., Disp: , Rfl:    Review of Systems  Constitutional: Negative for activity change, appetite change, chills, diaphoresis, fatigue, fever and unexpected weight change.  HENT: Negative for congestion, rhinorrhea, sinus pressure, sneezing, sore throat and trouble swallowing.   Eyes: Negative for photophobia and visual disturbance.  Respiratory: Negative for cough, chest tightness, shortness of breath, wheezing and stridor.   Cardiovascular: Negative for chest pain, palpitations and leg swelling.  Gastrointestinal: Negative for abdominal distention, abdominal pain, anal bleeding, blood in stool, constipation, diarrhea, nausea and vomiting.  Genitourinary: Negative for difficulty urinating, dysuria, flank pain and hematuria.  Musculoskeletal: Positive for back pain. Negative for arthralgias, gait problem, joint swelling and myalgias.  Skin: Negative for color change, pallor, rash and wound.  Neurological: Negative for dizziness, tremors, weakness and light-headedness.  Hematological: Negative for adenopathy. Does not bruise/bleed easily.  Psychiatric/Behavioral: Negative for agitation, behavioral problems, confusion, decreased concentration, dysphoric mood and sleep  disturbance.       Objective:   Physical Exam Constitutional:      General: He is not in acute distress.    Appearance: Normal appearance. He is well-developed. He is not ill-appearing or diaphoretic.  HENT:     Head: Normocephalic and atraumatic.     Right Ear: Hearing and external ear normal.     Left Ear: Hearing and external ear normal.     Nose: No nasal deformity or rhinorrhea.  Eyes:     General: No scleral icterus.    Extraocular Movements: Extraocular movements intact.     Conjunctiva/sclera: Conjunctivae normal.     Right eye: Right conjunctiva is not injected.     Left eye: Left conjunctiva is not injected.  Neck:     Vascular: No JVD.  Cardiovascular:     Rate and Rhythm: Normal rate and regular rhythm.     Heart sounds: S1 normal and S2 normal.  Abdominal:     General: There is no distension.  Musculoskeletal:  General: Normal range of motion.     Right shoulder: Normal.     Left shoulder: Normal.     Cervical back: Normal range of motion and neck supple.     Right hip: Normal.     Left hip: Normal.     Right knee: Normal.     Left knee: Normal.  Lymphadenopathy:     Head:     Right side of head: No submandibular, preauricular or posterior auricular adenopathy.     Left side of head: No submandibular, preauricular or posterior auricular adenopathy.     Cervical: No cervical adenopathy.     Right cervical: No superficial or deep cervical adenopathy.    Left cervical: No superficial or deep cervical adenopathy.  Skin:    General: Skin is warm and dry.     Coloration: Skin is not pale.     Findings: No abrasion, bruising, ecchymosis, erythema, lesion or rash.     Nails: There is no clubbing.  Neurological:     General: No focal deficit present.     Mental Status: He is alert and oriented to person, place, and time.     Sensory: No sensory deficit.     Coordination: Coordination normal.     Gait: Gait normal.  Psychiatric:        Attention and  Perception: He is attentive.        Mood and Affect: Mood normal.        Speech: Speech normal.        Behavior: Behavior normal. Behavior is cooperative.        Thought Content: Thought content normal.        Judgment: Judgment normal.           Assessment & Plan:   Recurrent urinary tract infections x6 months: His description of what his urine looks like and his systemic symptoms are bothersome.  He has not had an overt fever apparently but certainly what he describes is disconcerting.  I will check a urine analysis and culture today not because living as urinary tract infection now but in case he develops symptoms we can then have some data in terms of potentially an organism from the urine to target should he develop symptoms over the holidays.  Again kidney stones could be a nidus for his recurrent infections though he does not have a large amount of than there was one seen on CT scan  Renal cyst we will check an MRI of the abdomen to reevaluate this  Epidural abscess: This is resolved COVID-19 prevention recommended finishing vaccination

## 2020-08-18 LAB — URINALYSIS, ROUTINE W REFLEX MICROSCOPIC
Bilirubin Urine: NEGATIVE
Glucose, UA: NEGATIVE
Hgb urine dipstick: NEGATIVE
Ketones, ur: NEGATIVE
Leukocytes,Ua: NEGATIVE
Nitrite: NEGATIVE
Protein, ur: NEGATIVE
Specific Gravity, Urine: 1.019 (ref 1.001–1.03)
pH: 6.5 (ref 5.0–8.0)

## 2020-08-18 LAB — URINE CULTURE
MICRO NUMBER:: 11338547
Result:: NO GROWTH
SPECIMEN QUALITY:: ADEQUATE

## 2020-09-28 ENCOUNTER — Ambulatory Visit: Payer: 59 | Admitting: Infectious Disease

## 2020-10-12 NOTE — Progress Notes (Signed)
Patient ID: Brian [name], male   DOB: 09/11/1965, 55 y.o.   MRN: [id number] Called patient fourth time to schedule  Left another message     Closing referral   patient no showed his last visit

## 2020-11-08 NOTE — Progress Notes (Deleted)
CARDIOLOGY CONSULT NOTE       Patient ID: Brian [name] MRN: [id number] DOB/AGE: 1966/01/07 55 y.o.  Admit date: (Not on file) Referring Physician: Tenny Craw Primary Physician: Daisy Floro, MD Primary Cardiologist: Eden Emms Reason for Consultation: CAD  Active Problems:   * No active hospital problems. *   HPI:  55 y.o. history of GERD, HLD and DM-2  Father and uncle had premature CAD I took care of his dad who had CABG. Had normal ETT 2015 Calcium score was 16 involving the proximal and mid LAD This was 73 rd percentile for age/sex.  His twins are now 56 yo one in [place] doing mission work and daughter in [place] doing Comptroller  Lab review with Hct 41, A1c 6.3 Cr 1.04 Normal LfTls  LDL 84   He had a lot of job stress working for [employer] but better now Diet and exercise poor but getting Better now since diagnosis of DM> Father passed 4 years ago   Cardiac CTA done 02/25/20 reviewed Calcium score 252 90 th percentile 50-60% proximal LAD disease with negative FFR CT 0.87 despite morphology being described as "napkin ring" Sign low attenuation plaque with spotty calcification   Has had distant epidural abscess in 2015 and frequent UTI's With extended courses of antibiotics   ***   ROS All other systems reviewed and negative except as noted above  Past Medical History:  Diagnosis Date  . GERD (gastroesophageal reflux disease)    hx. reflux.  . Hyperlipidemia    tx. with meds(high triglycerides)  . Recurrent UTI 06/30/2020  . Renal cyst 08/17/2020  . Ventral hernia     Family History  Problem Relation Age of Onset  . Heart disease Father   . Heart disease Paternal Uncle     Social History   Socioeconomic History  . Marital status: Married    Spouse name: Not on file  . Number of children: Not on file  . Years of education: Not on file  . Highest education level: Not on file  Occupational History  . Not on file  Tobacco Use  . Smoking  status: Never Smoker  . Smokeless tobacco: Never Used  Substance and Sexual Activity  . Alcohol use: No  . Drug use: No  . Sexual activity: Not Currently  Other Topics Concern  . Not on file  Social History Narrative  . Not on file   Social Determinants of Health   Financial Resource Strain: Not on file  Food Insecurity: Not on file  Transportation Needs: Not on file  Physical Activity: Not on file  Stress: Not on file  Social Connections: Not on file  Intimate Partner Violence: Not on file    Past Surgical History:  Procedure Laterality Date  . ADENOIDECTOMY  ` 1972  . BACK SURGERY    . HERNIA REPAIR    . LAMINECTOMY AND MICRODISCECTOMY LUMBAR SPINE  05/02/2014   redo  . LUMBAR LAMINECTOMY FOR EPIDURAL ABSCESS N/A 05/02/2014   Procedure: LUMBAR LAMINECTOMY FOR EPIDURAL ABSCESS, lumbar four-five right;  Surgeon: Mariam Dollar, MD;  Location: [facility];  Service: Neurosurgery;  Laterality: N/A;  . MICRODISCECTOMY LUMBAR  10/2001   "L3-4"  . ORIF CLAVICULAR FRACTURE Left 07/1988  . POSTERIOR LAMINECTOMY / DECOMPRESSION LUMBAR SPINE  03/07/2014   "L4-5"  . UMBILICAL HERNIA REPAIR  ~ 2011  . VENTRAL HERNIA REPAIR  07/27/2011   Procedure: LAPAROSCOPIC VENTRAL HERNIA;  Surgeon: Mariella Saa, MD;  Location:  [facility];  Service: General;  Laterality: N/A;  . WRIST FRACTURE SURGERY Left 07/1988   with retained hardware      Current Outpatient Medications:  .  aspirin EC 81 MG tablet, Take 1 tablet (81 mg total) by mouth daily., Disp:  , Rfl:  .  atorvastatin (LIPITOR) 20 MG tablet, TAKE 1 TABLET EVERY OTHER DAY ORALLY 30 DAY(S), Disp: , Rfl:  .  Cholecalciferol (VITAMIN D3) 25 MCG (1000 UT) CAPS, 1 capsule, Disp: , Rfl:  .  Multiple Vitamins-Minerals (MULTI COMPLETE) CAPS, See admin instructions., Disp: , Rfl:     Physical Exam: There were no vitals taken for this visit.   Affect appropriate Healthy:  appears stated age HEENT: normal Neck supple with no adenopathy JVP  normal no bruits no thyromegaly Lungs clear with no wheezing and good diaphragmatic motion Heart:  S1/S2 no murmur, no rub, gallop or click PMI normal Abdomen: benighn, BS positve, no tenderness, no AAA no bruit.  No HSM or HJR Distal pulses intact with no bruits No edema Neuro non-focal Skin warm and dry No muscular weakness   Labs:   Lab Results  Component Value Date   WBC 7.3 06/30/2020   HGB 14.1 06/30/2020   HCT 40.5 06/30/2020   MCV 90.8 06/30/2020   PLT 238 06/30/2020      Radiology: No results found.  EKG: May 2015 NSR normal ECG 01/29/20 SR rate 70 normal    ASSESSMENT AND PLAN:   1. CAD: moderate LAD stenosis with negative FFR CT 02/25/20 ***  2. DM:  Discussed low carb diet.  Target hemoglobin A1c is 6.5 or less.  Continue current medications.  3. HLD:  Continue statin labs with Dr Tenny Craw Target LdL 70 or less   *** ETT  Signed: Charlton Haws 11/08/2020, 11:21 AM

## 2020-11-19 ENCOUNTER — Ambulatory Visit: Payer: 59 | Admitting: Cardiovascular Disease

## 2021-03-24 DIAGNOSIS — M7042 Prepatellar bursitis, left knee: Secondary | ICD-10-CM | POA: Diagnosis not present

## 2021-06-02 DIAGNOSIS — M7042 Prepatellar bursitis, left knee: Secondary | ICD-10-CM | POA: Diagnosis not present

## 2021-07-13 DIAGNOSIS — M7042 Prepatellar bursitis, left knee: Secondary | ICD-10-CM | POA: Diagnosis not present

## 2022-07-01 NOTE — Progress Notes (Signed)
CARDIOLOGY CONSULT NOTE       Patient ID: Brian [name] MRN: [id number] DOB/AGE: 01-08-1966 56 y.o.  Admit date: (Not on file) Referring Physician: Harrington Challenger Primary Physician: Lawerance Cruel, MD Primary Cardiologist: New/Ethelyn Cerniglia Reason for Consultation: CAD  Active Problems:   * No active hospital problems. *   HPI:  56 y.o. last seen by cardiology in 2015. Has not been seen in 2 years History of GERD, HLD and  DM-2.  Father and uncle had premature CAD I took care of his dad who had CABG. Had normal ETT 2015 Calcium score was 16 involving the proximal and mid LAD This was 73 rd percentile for age/sex.  His twins are now 57 yo one in [place] doing agricultural work and daughter in [place] doing Programmer, applications  Lab review with Hct 41, A1c 6.3 Cr 1.04 Normal LfTls  LDL 84   He had a lot of job stress working for [employer] but better now  Father passed 5 years ago He does get some tightness in chest when  Cardiac CT 02/25/20 Calcium score 252 , 90 th percentile prox LAD with 50-60% plaque Napkin ring morphology FFR negative in LAD 0.87  Frequent UTI's with some renal stones on CT Indeterminate hypodense left renal lesion Was supposed to get MRI  Wife works with [employer] and thanked me for donations    ROS All other systems reviewed and negative except as noted above  Past Medical History:  Diagnosis Date   GERD (gastroesophageal reflux disease)    hx. reflux.   Hyperlipidemia    tx. with meds(high triglycerides)   Recurrent UTI 06/30/2020   Renal cyst 08/17/2020   Ventral hernia     Family History  Problem Relation Age of Onset   Heart disease Father    Heart disease Paternal Uncle     Social History   Socioeconomic History   Marital status: Married    Spouse name: Not on file   Number of children: Not on file   Years of education: Not on file   Highest education level: Not on file  Occupational History   Not on file  Tobacco Use   Smoking status:  Never   Smokeless tobacco: Never  Substance and Sexual Activity   Alcohol use: No   Drug use: No   Sexual activity: Not Currently  Other Topics Concern   Not on file  Social History Narrative   Not on file   Social Determinants of Health   Financial Resource Strain: Not on file  Food Insecurity: Not on file  Transportation Needs: Not on file  Physical Activity: Not on file  Stress: Not on file  Social Connections: Not on file  Intimate Partner Violence: Not on file    Past Surgical History:  Procedure Laterality Date   [place]  05/02/2014   redo   [place] N/A 05/02/2014   Procedure: LUMBAR LAMINECTOMY FOR EPIDURAL ABSCESS, lumbar four-five right;  Surgeon: Elaina Hoops, MD;  Location: [facility];  Service: Neurosurgery;  Laterality: N/A;   MICRODISCECTOMY LUMBAR  10/2001   "L3-4"   ORIF CLAVICULAR FRACTURE Left 07/1988   POSTERIOR LAMINECTOMY / DECOMPRESSION LUMBAR SPINE  01/06/2584   "I7-7"   UMBILICAL HERNIA REPAIR  ~ 2011   VENTRAL HERNIA REPAIR  07/27/2011   Procedure: LAPAROSCOPIC VENTRAL HERNIA;  Surgeon: Mariella Saa, MD;  Location: [facility];  Service: General;  Laterality: N/A;   WRIST FRACTURE SURGERY Left 07/1988   with retained hardware      Current Outpatient Medications:    aspirin EC 81 MG tablet, Take 1 tablet (81 mg total) by mouth daily., Disp:  , Rfl:    atorvastatin (LIPITOR) 20 MG tablet, TAKE 1 TABLET EVERY OTHER DAY ORALLY 30 DAY(S), Disp: , Rfl:    Cholecalciferol (VITAMIN D3) 25 MCG (1000 UT) CAPS, 1 capsule, Disp: , Rfl:    Multiple Vitamins-Minerals (MULTI COMPLETE) CAPS, See admin instructions., Disp: , Rfl:     Physical Exam: Blood pressure (!) 126/90, pulse 77, height 5\' 10"  (1.778 m), weight 201 lb 12.8 oz (91.5 kg), SpO2 98 %.   Affect appropriate Healthy:  appears stated age HEENT: normal Neck supple  with no adenopathy JVP normal no bruits no thyromegaly Lungs clear with no wheezing and good diaphragmatic motion Heart:  S1/S2 no murmur, no rub, gallop or click PMI normal Abdomen: benighn, BS positve, no tenderness, no AAA no bruit.  No HSM or HJR post hernia repair with mesh Distal pulses intact with no bruits No edema Neuro non-focal Skin warm and dry No muscular weakness   Labs:   Lab Results  Component Value Date   WBC 7.3 06/30/2020   HGB 14.1 06/30/2020   HCT 40.5 06/30/2020   MCV 90.8 06/30/2020   PLT 238 06/30/2020      Radiology: No results found.  EKG: 07/15/2022 NSR normal    ASSESSMENT AND PLAN:   1. CAD:  cardiac CT 02/26/20 moderate LAD lesion with high risk morphology but negative FFR. With normal ECG will order POET and make sure no progression   2. DM:  Discussed low carb diet.  Target hemoglobin A1c is 6.5 or less.  Continue current medications.  3. HLD:  Continue statin labs with Dr 02/28/20 Target LdL 70 or less   4. GU:  ? Need for MRI to further evaluate left renal lesion in setting of frequent UTI's and stones   ETT  F/U in a year if normal   Signed: Tenny Craw 07/15/2022, 8:06 AM

## 2022-07-15 ENCOUNTER — Encounter: Payer: Self-pay | Admitting: Cardiovascular Disease

## 2022-07-15 ENCOUNTER — Ambulatory Visit: Payer: BC Managed Care – PPO | Attending: Cardiovascular Disease | Admitting: Cardiovascular Disease

## 2022-07-15 VITALS — BP 126/90 | HR 77 | Ht 70.0 in | Wt 201.8 lb

## 2022-07-15 DIAGNOSIS — E782 Mixed hyperlipidemia: Secondary | ICD-10-CM | POA: Diagnosis not present

## 2022-07-15 DIAGNOSIS — I2510 Atherosclerotic heart disease of native coronary artery without angina pectoris: Secondary | ICD-10-CM | POA: Diagnosis not present

## 2022-07-15 NOTE — Patient Instructions (Addendum)
Medication Instructions:  Your physician recommends that you continue on your current medications as directed. Please refer to the Current Medication list given to you today.  *If you need a refill on your cardiac medications before your next appointment, please call your pharmacy*   Lab Work: If you have labs (blood work) drawn today and your tests are completely normal, you will receive your results only by: MyChart Message (if you have MyChart) OR A paper copy in the mail If you have any lab test that is abnormal or we need to change your treatment, we will call you to review the results.   Testing/Procedures: Your physician has requested that you have an exercise tolerance test. For further information please visit [email or web address]. Please also follow instruction sheet, as given.  Follow-Up: At [facility], you and your health needs are our priority.  As part of our continuing mission to provide you with exceptional heart care, we have created designated Provider Care Teams.  These Care Teams include your primary Cardiologist (physician) and Advanced Practice Providers (APPs -  Physician Assistants and Nurse Practitioners) who all work together to provide you with the care you need, when you need it.  We recommend signing up for the patient portal called "MyChart".  Sign up information is provided on this After Visit Summary.  MyChart is used to connect with patients for Virtual Visits (Telemedicine).  Patients are able to view lab/test results, encounter notes, upcoming appointments, etc.  Non-urgent messages can be sent to your provider as well.   To learn more about what you can do with MyChart, go to [email or web address].    Your next appointment:   1 year(s)  The format for your next appointment:   In Person  Provider:   Peter Nishan, MD      Important Information About Sugar       

## 2022-07-26 ENCOUNTER — Ambulatory Visit: Payer: BC Managed Care – PPO | Attending: Cardiovascular Disease

## 2022-07-26 DIAGNOSIS — I2510 Atherosclerotic heart disease of native coronary artery without angina pectoris: Secondary | ICD-10-CM

## 2022-07-26 LAB — EXERCISE TOLERANCE TEST
Angina Index: 0
Duke Treadmill Score: 7
Estimated workload: 7.7
Exercise duration (min): 6 min
Exercise duration (sec): 30 s
MPHR: 163 {beats}/min
Peak HR: 169 {beats}/min
Percent HR: 103 %
RPE: 15
Rest HR: 107 {beats}/min
ST Depression (mm): 0 mm

## 2022-08-10 IMAGING — CT CT ABD-PELV W/ CM
2 of 5 series · 15 of 46 positions shown, 17 images · IV contrast (APPLIED)
Comparison: Ultrasound renal 06/15/2020

CLINICAL DATA: Urinary tract infection, recurrence, complicated.
purulent urine for 6 months. Antibiotics.

EXAM:
CT ABDOMEN AND PELVIS WITH CONTRAST
TECHNIQUE: Multidetector CT imaging of the abdomen and pelvis was performed
using the standard protocol following bolus administration of
intravenous contrast.
CONTRAST:  100mL OMNIPAQUE IOHEXOL 300 MG/ML  SOLN

[Series 2: axial st · axial · 0.92mm/px · z∈[-464,-4]mm · 12 of 108 slices shown, 14 images]
[im 8/108  soft-tissue]
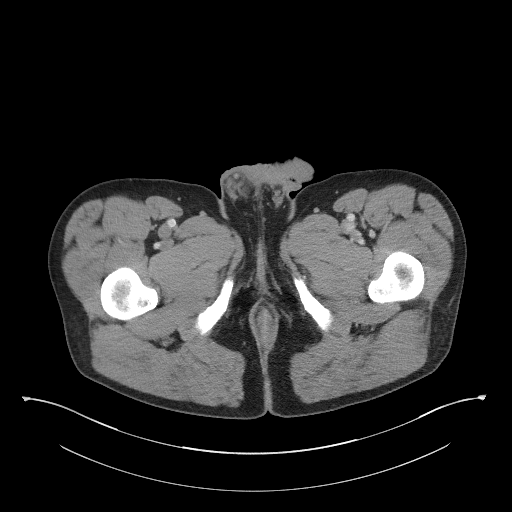
[im 8/108  bone]
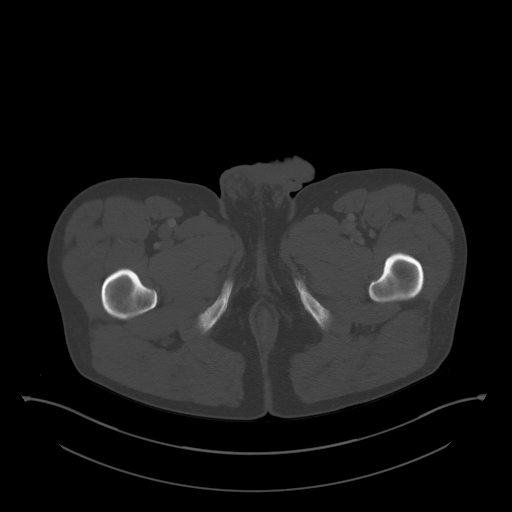
[im 16/108  soft-tissue]
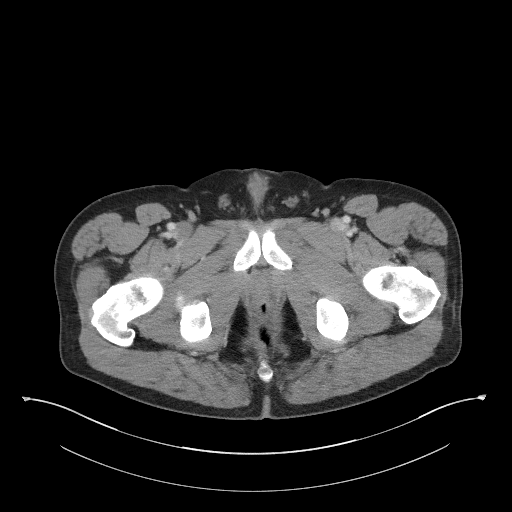
[im 23/108  soft-tissue]
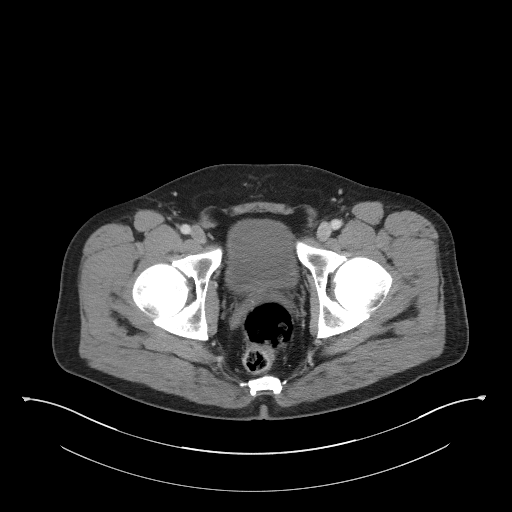
[im 31/108  soft-tissue]
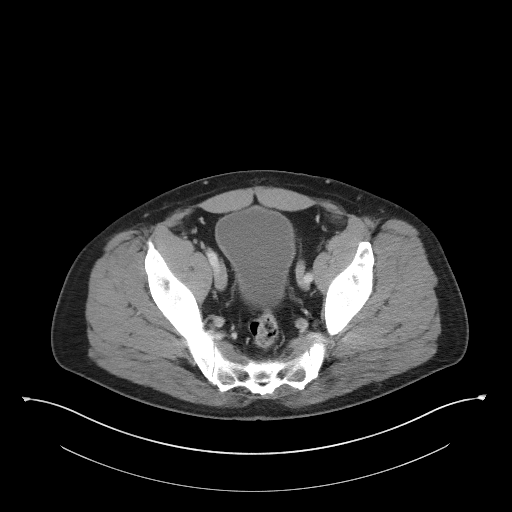
[im 39/108  soft-tissue]
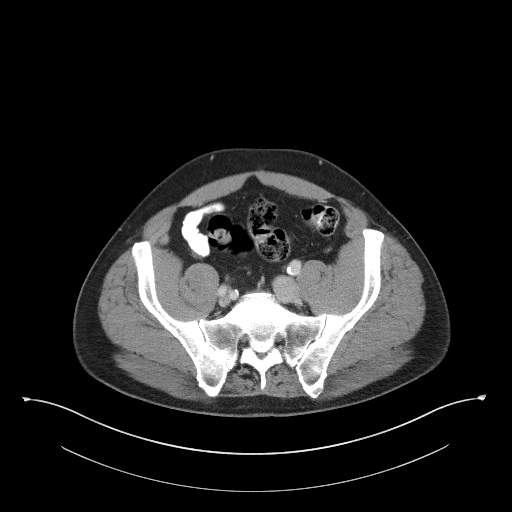
[im 46/108  soft-tissue]
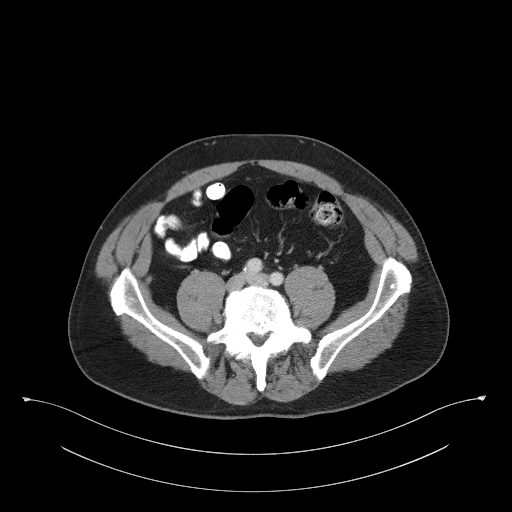
[im 62/108  soft-tissue]
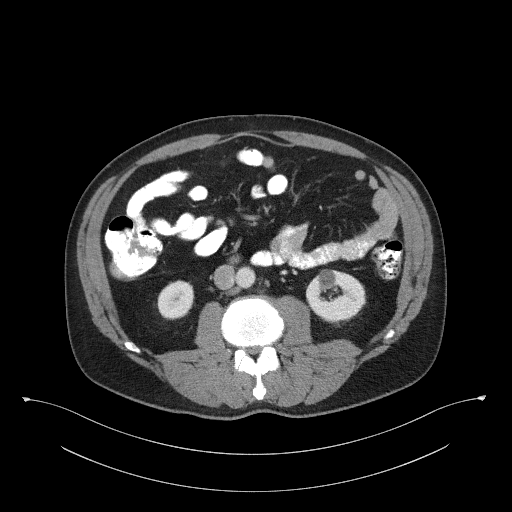
[im 69/108  soft-tissue]
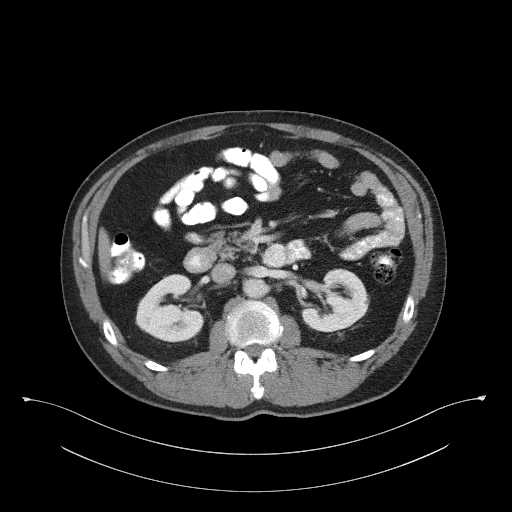
[im 77/108  soft-tissue]
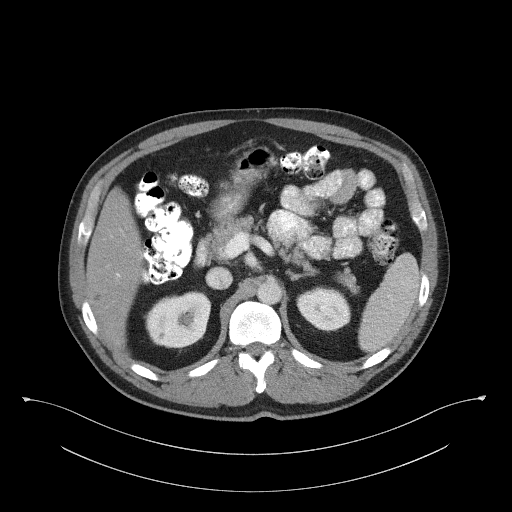
[im 77/108  bone]
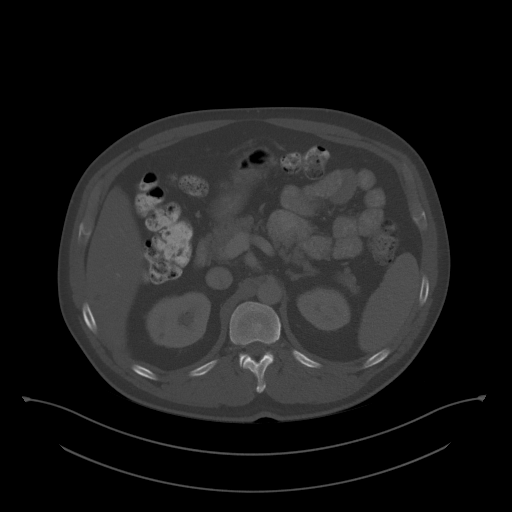
[im 85/108  soft-tissue]
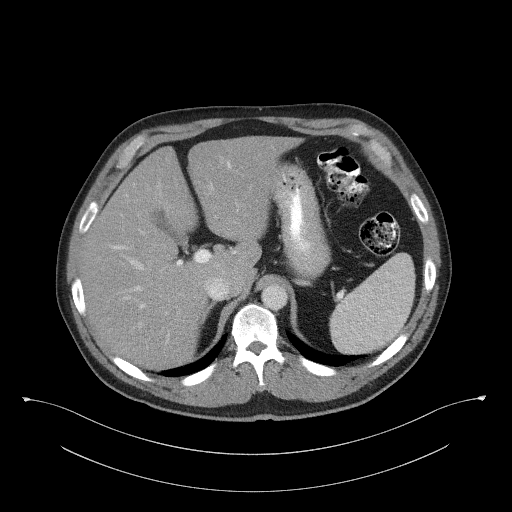
[im 92/108  soft-tissue]
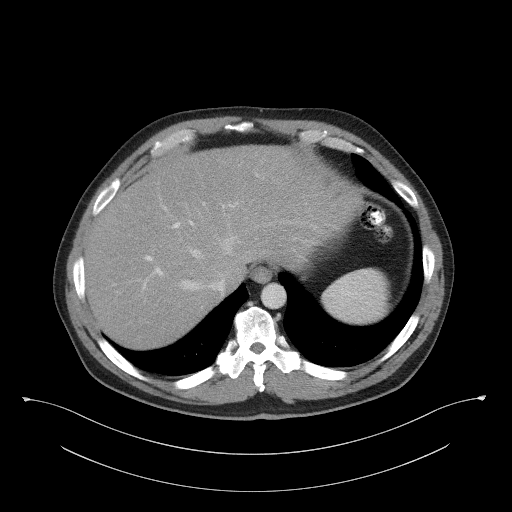
[im 100/108  soft-tissue]
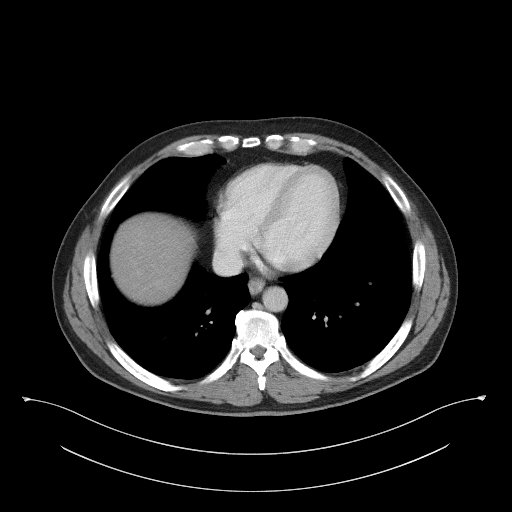

[Series 5: coronal st · coronal · 0.82mm/px · 3 of 111 slices shown]
[im 37/111  soft-tissue]
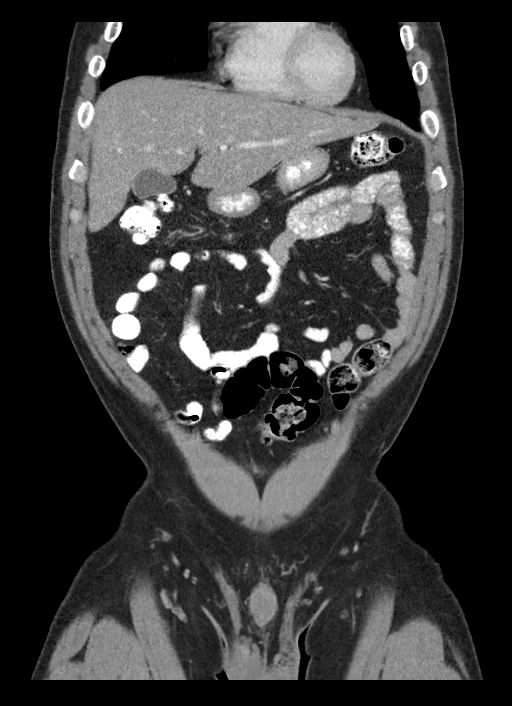
[im 49/111  soft-tissue]
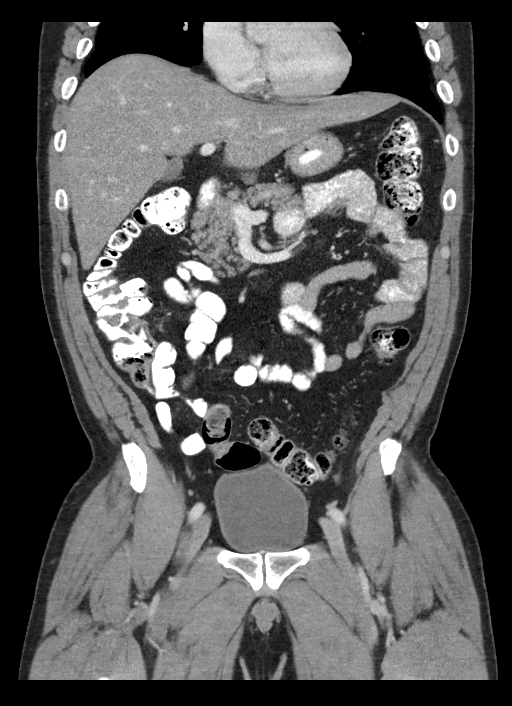
[im 62/111  soft-tissue]
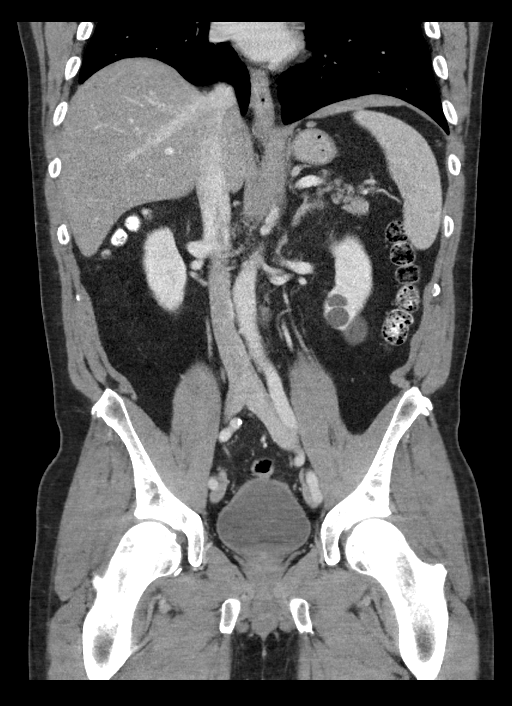

[15 of 46 positions shown; findings below may reference images not displayed]

FINDINGS: Lower chest: No acute abnormality.

Hepatobiliary: The hepatic parenchyma is diffusely hypodense
compared to the splenic parenchyma consistent with fatty
infiltration. Subcentimeter hypodensity within the right hepatic
lobe is too small to characterize ([DATE]). Otherwise no focal liver
abnormality. No gallstones, gallbladder wall thickening, or
pericholecystic fluid. No biliary dilatation.

Pancreas: No focal lesion. Normal pancreatic contour. No surrounding
inflammatory changes. No main pancreatic ductal dilatation.

Spleen: Normal in size without focal abnormality.

Adrenals/Urinary Tract:

No adrenal nodule bilaterally.

Several hypodense fluid density lesions within the kidneys likely
represent simple renal cysts with the largest measuring up to 2.9 cm
on the left ([DATE]). Several subcentimeter hypodensities are too
small to characterize. There is a 1.7 cm hypodense lesion within the
inferior pole of the left kidney that measures slightly higher than
simple free fluid. Just inferior to this there is a left 3 mm
calcified stone. No right nephrolithiasis. No ureterolithiasis
bilaterally. No hydronephrosis or hydroureter bilaterally. Otherwise
bilateral kidneys enhance symmetrically. On delayed imaging, there
is no urothelial wall thickening and there are no filling defects in
the opacified portions of the bilateral collecting systems or
ureters. The urinary bladder is unremarkable.

Stomach/Bowel: PO contrast reaches the sigmoid colon. Stomach is
within normal limits. No evidence of bowel wall thickening or
dilatation. Few scattered sigmoid diverticula. Appendix appears
normal.

Vascular/Lymphatic: No abdominal aorta or iliac aneurysm. Mild
atherosclerotic plaque. No abdominal, pelvic, or inguinal
lymphadenopathy.

Reproductive: Prostate is unremarkable.

Other: No intraperitoneal free fluid. No intraperitoneal free gas.
No organized fluid collection.

Musculoskeletal:

Abdominal hernia repair with mesh. Tiny fat containing umbilical
hernia. No definite urachal diverticulum.

No suspicious lytic or blastic osseous lesions. No acute displaced
fracture. Multilevel degenerative changes of the spine that are
moderate to severe at the L3 through S1 levels.
IMPRESSION: 1. Indeterminate 1.7 cm hypodense left renal lesion. Recommend MRI
renal ultrasound for further evaluation.
2. Nonobstructive 3 mm left nephrolithiasis.
3. Few scattered sigmoid diverticula with no acute diverticulitis.
4. Hepatic steatosis.
5. Status post abdominal hernia repair with mesh.

## 2022-09-24 ENCOUNTER — Emergency Department (HOSPITAL_BASED_OUTPATIENT_CLINIC_OR_DEPARTMENT_OTHER): Payer: BC Managed Care – PPO

## 2022-09-24 ENCOUNTER — Other Ambulatory Visit: Payer: Self-pay

## 2022-09-24 ENCOUNTER — Inpatient Hospital Stay (HOSPITAL_COMMUNITY): Payer: BC Managed Care – PPO

## 2022-09-24 ENCOUNTER — Encounter (HOSPITAL_BASED_OUTPATIENT_CLINIC_OR_DEPARTMENT_OTHER): Payer: Self-pay | Admitting: Urology

## 2022-09-24 ENCOUNTER — Inpatient Hospital Stay (HOSPITAL_BASED_OUTPATIENT_CLINIC_OR_DEPARTMENT_OTHER)
Admission: EM | Admit: 2022-09-24 | Discharge: 2022-09-28 | DRG: 280 | Disposition: A | Discharge status: Home / Self Care | Payer: BC Managed Care – PPO | Attending: Internal Medicine | Admitting: Internal Medicine

## 2022-09-24 DIAGNOSIS — Z885 Allergy status to narcotic agent status: Secondary | ICD-10-CM

## 2022-09-24 DIAGNOSIS — I469 Cardiac arrest, cause unspecified: Principal | ICD-10-CM | POA: Diagnosis present

## 2022-09-24 DIAGNOSIS — E876 Hypokalemia: Secondary | ICD-10-CM | POA: Diagnosis not present

## 2022-09-24 DIAGNOSIS — Z7982 Long term (current) use of aspirin: Secondary | ICD-10-CM

## 2022-09-24 DIAGNOSIS — R4182 Altered mental status, unspecified: Secondary | ICD-10-CM | POA: Diagnosis not present

## 2022-09-24 DIAGNOSIS — N179 Acute kidney failure, unspecified: Secondary | ICD-10-CM | POA: Diagnosis not present

## 2022-09-24 DIAGNOSIS — I4721 Torsades de pointes: Secondary | ICD-10-CM | POA: Diagnosis not present

## 2022-09-24 DIAGNOSIS — J9601 Acute respiratory failure with hypoxia: Secondary | ICD-10-CM | POA: Diagnosis present

## 2022-09-24 DIAGNOSIS — E781 Pure hyperglyceridemia: Secondary | ICD-10-CM | POA: Diagnosis present

## 2022-09-24 DIAGNOSIS — Z87442 Personal history of urinary calculi: Secondary | ICD-10-CM

## 2022-09-24 DIAGNOSIS — I213 ST elevation (STEMI) myocardial infarction of unspecified site: Secondary | ICD-10-CM | POA: Diagnosis not present

## 2022-09-24 DIAGNOSIS — Z79899 Other long term (current) drug therapy: Secondary | ICD-10-CM

## 2022-09-24 DIAGNOSIS — Z8661 Personal history of infections of the central nervous system: Secondary | ICD-10-CM

## 2022-09-24 DIAGNOSIS — I2111 ST elevation (STEMI) myocardial infarction involving right coronary artery: Secondary | ICD-10-CM | POA: Diagnosis not present

## 2022-09-24 DIAGNOSIS — I2510 Atherosclerotic heart disease of native coronary artery without angina pectoris: Secondary | ICD-10-CM | POA: Diagnosis present

## 2022-09-24 DIAGNOSIS — I110 Hypertensive heart disease with heart failure: Secondary | ICD-10-CM | POA: Diagnosis present

## 2022-09-24 DIAGNOSIS — Z8249 Family history of ischemic heart disease and other diseases of the circulatory system: Secondary | ICD-10-CM

## 2022-09-24 DIAGNOSIS — I358 Other nonrheumatic aortic valve disorders: Secondary | ICD-10-CM | POA: Diagnosis present

## 2022-09-24 DIAGNOSIS — I420 Dilated cardiomyopathy: Secondary | ICD-10-CM | POA: Diagnosis not present

## 2022-09-24 DIAGNOSIS — R7401 Elevation of levels of liver transaminase levels: Secondary | ICD-10-CM | POA: Diagnosis not present

## 2022-09-24 DIAGNOSIS — R569 Unspecified convulsions: Secondary | ICD-10-CM | POA: Diagnosis not present

## 2022-09-24 DIAGNOSIS — Z87440 Personal history of urinary (tract) infections: Secondary | ICD-10-CM

## 2022-09-24 DIAGNOSIS — Z1152 Encounter for screening for COVID-19: Secondary | ICD-10-CM

## 2022-09-24 DIAGNOSIS — K219 Gastro-esophageal reflux disease without esophagitis: Secondary | ICD-10-CM | POA: Diagnosis present

## 2022-09-24 DIAGNOSIS — I5023 Acute on chronic systolic (congestive) heart failure: Secondary | ICD-10-CM | POA: Diagnosis not present

## 2022-09-24 DIAGNOSIS — E871 Hypo-osmolality and hyponatremia: Secondary | ICD-10-CM | POA: Diagnosis present

## 2022-09-24 DIAGNOSIS — E1165 Type 2 diabetes mellitus with hyperglycemia: Secondary | ICD-10-CM | POA: Diagnosis present

## 2022-09-24 DIAGNOSIS — K7200 Acute and subacute hepatic failure without coma: Secondary | ICD-10-CM | POA: Diagnosis not present

## 2022-09-24 DIAGNOSIS — I2119 ST elevation (STEMI) myocardial infarction involving other coronary artery of inferior wall: Secondary | ICD-10-CM | POA: Diagnosis not present

## 2022-09-24 DIAGNOSIS — E872 Acidosis, unspecified: Secondary | ICD-10-CM | POA: Diagnosis present

## 2022-09-24 DIAGNOSIS — E119 Type 2 diabetes mellitus without complications: Secondary | ICD-10-CM | POA: Diagnosis not present

## 2022-09-24 DIAGNOSIS — E7800 Pure hypercholesterolemia, unspecified: Secondary | ICD-10-CM | POA: Diagnosis not present

## 2022-09-24 DIAGNOSIS — Z888 Allergy status to other drugs, medicaments and biological substances status: Secondary | ICD-10-CM

## 2022-09-24 DIAGNOSIS — I5021 Acute systolic (congestive) heart failure: Secondary | ICD-10-CM | POA: Diagnosis not present

## 2022-09-24 DIAGNOSIS — I462 Cardiac arrest due to underlying cardiac condition: Secondary | ICD-10-CM | POA: Diagnosis not present

## 2022-09-24 DIAGNOSIS — R059 Cough, unspecified: Secondary | ICD-10-CM | POA: Diagnosis not present

## 2022-09-24 DIAGNOSIS — I255 Ischemic cardiomyopathy: Secondary | ICD-10-CM | POA: Diagnosis present

## 2022-09-24 DIAGNOSIS — I4901 Ventricular fibrillation: Secondary | ICD-10-CM | POA: Diagnosis not present

## 2022-09-24 DIAGNOSIS — Z4682 Encounter for fitting and adjustment of non-vascular catheter: Secondary | ICD-10-CM | POA: Diagnosis not present

## 2022-09-24 DIAGNOSIS — R0789 Other chest pain: Secondary | ICD-10-CM | POA: Diagnosis not present

## 2022-09-24 LAB — URINALYSIS, MICROSCOPIC (REFLEX)

## 2022-09-24 LAB — COMPREHENSIVE METABOLIC PANEL
ALT: 33 U/L (ref 0–44)
AST: 39 U/L (ref 15–41)
Albumin: 4.2 g/dL (ref 3.5–5.0)
Alkaline Phosphatase: 99 U/L (ref 38–126)
Anion gap: 13 (ref 5–15)
BUN: 15 mg/dL (ref 6–20)
CO2: 20 mmol/L — ABNORMAL LOW (ref 22–32)
Calcium: 8.8 mg/dL — ABNORMAL LOW (ref 8.9–10.3)
Chloride: 96 mmol/L — ABNORMAL LOW (ref 98–111)
Creatinine, Ser: 1.04 mg/dL (ref 0.61–1.24)
GFR, Estimated: >60 mL/min (ref >60)
Glucose, Bld: 321 mg/dL — ABNORMAL HIGH (ref 70–99)
Potassium: 4.1 mmol/L (ref 3.5–5.1)
Sodium: 129 mmol/L — ABNORMAL LOW (ref 135–145)
Total Bilirubin: 0.5 mg/dL (ref 0.3–1.2)
Total Protein: 7.5 g/dL (ref 6.5–8.1)

## 2022-09-24 LAB — CBC WITH DIFFERENTIAL/PLATELET
Abs Immature Granulocytes: 0.02 10*3/uL (ref 0.00–0.07)
Basophils Absolute: 0.1 10*3/uL (ref 0.0–0.1)
Basophils Relative: 1 %
Eosinophils Absolute: 0.2 10*3/uL (ref 0.0–0.5)
Eosinophils Relative: 2 %
HCT: 44.4 % (ref 39.0–52.0)
Hemoglobin: 15.9 g/dL (ref 13.0–17.0)
Immature Granulocytes: 0 %
Lymphocytes Relative: 36 %
Lymphs Abs: 3.6 10*3/uL (ref 0.7–4.0)
MCH: 31.1 pg (ref 26.0–34.0)
MCHC: 35.8 g/dL (ref 30.0–36.0)
MCV: 86.7 fL (ref 80.0–100.0)
Monocytes Absolute: 0.7 10*3/uL (ref 0.1–1.0)
Monocytes Relative: 7 %
Neutro Abs: 5.4 10*3/uL (ref 1.7–7.7)
Neutrophils Relative %: 54 %
Platelets: 160 10*3/uL (ref 150–400)
RBC: 5.12 MIL/uL (ref 4.22–5.81)
RDW: 12.4 % (ref 11.5–15.5)
WBC: 9.9 10*3/uL (ref 4.0–10.5)
nRBC: 0 % (ref 0.0–0.2)

## 2022-09-24 LAB — GLUCOSE, CAPILLARY
Glucose-Capillary: 557 mg/dL (ref 70–99)
Glucose-Capillary: 561 mg/dL (ref 70–99)
Glucose-Capillary: 573 mg/dL (ref 70–99)

## 2022-09-24 LAB — I-STAT ARTERIAL BLOOD GAS, ED
Acid-base deficit: 17 mmol/L — ABNORMAL HIGH (ref 0.0–2.0)
Bicarbonate: 11.9 mmol/L — ABNORMAL LOW (ref 20.0–28.0)
Calcium, Ion: 1.11 mmol/L — ABNORMAL LOW (ref 1.15–1.40)
HCT: 40 % (ref 39.0–52.0)
Hemoglobin: 13.6 g/dL (ref 13.0–17.0)
O2 Saturation: 98 %
Patient temperature: 94.4
Potassium: 4.7 mmol/L (ref 3.5–5.1)
Sodium: 127 mmol/L — ABNORMAL LOW (ref 135–145)
TCO2: 13 mmol/L — ABNORMAL LOW (ref 22–32)
pCO2 arterial: 35.1 mmHg (ref 32–48)
pH, Arterial: 7.123 — CL (ref 7.35–7.45)
pO2, Arterial: 131 mmHg — ABNORMAL HIGH (ref 83–108)

## 2022-09-24 LAB — URINALYSIS, ROUTINE W REFLEX MICROSCOPIC
Bilirubin Urine: NEGATIVE
Glucose, UA: >=500 mg/dL — AB
Ketones, ur: 15 mg/dL — AB
Leukocytes,Ua: NEGATIVE
Nitrite: NEGATIVE
Protein, ur: >=300 mg/dL — AB
Specific Gravity, Urine: 1.02 (ref 1.005–1.030)
pH: 6 (ref 5.0–8.0)

## 2022-09-24 LAB — POCT I-STAT 7, (LYTES, BLD GAS, ICA,H+H)
Acid-base deficit: 10 mmol/L — ABNORMAL HIGH (ref 0.0–2.0)
Bicarbonate: 16.4 mmol/L — ABNORMAL LOW (ref 20.0–28.0)
Calcium, Ion: 1.04 mmol/L — ABNORMAL LOW (ref 1.15–1.40)
HCT: 42 % (ref 39.0–52.0)
Hemoglobin: 14.3 g/dL (ref 13.0–17.0)
O2 Saturation: 100 %
Patient temperature: 38.3
Potassium: 5.7 mmol/L — ABNORMAL HIGH (ref 3.5–5.1)
Sodium: 135 mmol/L (ref 135–145)
TCO2: 17 mmol/L — ABNORMAL LOW (ref 22–32)
pCO2 arterial: 37.6 mmHg (ref 32–48)
pH, Arterial: 7.255 — ABNORMAL LOW (ref 7.35–7.45)
pO2, Arterial: 358 mmHg — ABNORMAL HIGH (ref 83–108)

## 2022-09-24 LAB — MRSA NEXT GEN BY PCR, NASAL: MRSA by PCR Next Gen: NOT DETECTED

## 2022-09-24 LAB — HEMOGLOBIN A1C
Hgb A1c MFr Bld: 10.8 % — ABNORMAL HIGH (ref 4.8–5.6)
Mean Plasma Glucose: 263.26 mg/dL

## 2022-09-24 LAB — CBG MONITORING, ED
Glucose-Capillary: 346 mg/dL — ABNORMAL HIGH (ref 70–99)
Glucose-Capillary: >600 mg/dL (ref 70–99)

## 2022-09-24 LAB — TROPONIN I (HIGH SENSITIVITY): Troponin I (High Sensitivity): 28 ng/L — ABNORMAL HIGH (ref <18)

## 2022-09-24 MED ORDER — ACETAMINOPHEN 160 MG/5ML PO SOLN: 650.0000 mg | ORAL | Status: DC | PRN

## 2022-09-24 MED ORDER — BUSPIRONE HCL 10 MG PO TABS
30.0000 mg | ORAL_TABLET | Freq: Three times a day (TID) | ORAL | Status: DC | PRN
  Administered 2022-09-25: 30 mg
  Filled 2022-09-24: qty 3

## 2022-09-24 MED ORDER — DOCUSATE SODIUM 50 MG/5ML PO LIQD
100.0000 mg | Freq: Two times a day (BID) | ORAL | Status: DC
  Administered 2022-09-25 (×2): 100 mg
  Filled 2022-09-24 (×2): qty 10

## 2022-09-24 MED ORDER — ACETAMINOPHEN 325 MG PO TABS: 650.0000 mg | ORAL_TABLET | ORAL | Status: DC | PRN

## 2022-09-24 MED ORDER — FAMOTIDINE 20 MG PO TABS
20.0000 mg | ORAL_TABLET | Freq: Two times a day (BID) | ORAL | Status: DC
  Administered 2022-09-25 (×2): 20 mg
  Filled 2022-09-24 (×2): qty 1

## 2022-09-24 MED ORDER — NOREPINEPHRINE 4 MG/250ML-% IV SOLN
0.0000 ug/min | INTRAVENOUS | Status: DC
  Administered 2022-09-24: 30 ug/min via INTRAVENOUS
  Administered 2022-09-24: 20 ug/min via INTRAVENOUS
  Administered 2022-09-25: 12 ug/min via INTRAVENOUS
  Administered 2022-09-25: 10 ug/min via INTRAVENOUS
  Administered 2022-09-25: 26 ug/min via INTRAVENOUS
  Filled 2022-09-24 (×4): qty 250

## 2022-09-24 MED ORDER — NOREPINEPHRINE 4 MG/250ML-% IV SOLN
2.0000 ug/min | INTRAVENOUS | Status: DC
  Administered 2022-09-24: 2 ug/min via INTRAVENOUS

## 2022-09-24 MED ORDER — ACETAMINOPHEN 650 MG RE SUPP
650.0000 mg | RECTAL | Status: DC
  Administered 2022-09-24 – 2022-09-25 (×2): 650 mg via RECTAL
  Filled 2022-09-24 (×2): qty 1

## 2022-09-24 MED ORDER — CHLORHEXIDINE GLUCONATE CLOTH 2 % EX PADS
6.0000 | MEDICATED_PAD | Freq: Every day | CUTANEOUS | Status: DC
  Administered 2022-09-24 – 2022-09-28 (×6): 6 via TOPICAL

## 2022-09-24 MED ORDER — MIDAZOLAM HCL 2 MG/2ML IJ SOLN
4.0000 mg | Freq: Once | INTRAMUSCULAR | Status: AC
  Administered 2022-09-24: 4 mg via INTRAVENOUS
  Filled 2022-09-24: qty 4

## 2022-09-24 MED ORDER — POLYETHYLENE GLYCOL 3350 17 G PO PACK: 17.0000 g | PACK | Freq: Every day | ORAL | Status: DC | PRN

## 2022-09-24 MED ORDER — DOCUSATE SODIUM 50 MG/5ML PO LIQD: 100.0000 mg | Freq: Two times a day (BID) | ORAL | Status: DC | PRN

## 2022-09-24 MED ORDER — MIDAZOLAM-SODIUM CHLORIDE 100-0.9 MG/100ML-% IV SOLN
0.5000 mg/h | INTRAVENOUS | Status: DC
  Administered 2022-09-24: 0.5 mg/h via INTRAVENOUS
  Filled 2022-09-24: qty 100

## 2022-09-24 MED ORDER — ATORVASTATIN CALCIUM 10 MG PO TABS
20.0000 mg | ORAL_TABLET | Freq: Every day | ORAL | Status: DC
  Administered 2022-09-25: 20 mg
  Filled 2022-09-24: qty 2

## 2022-09-24 MED ORDER — POLYETHYLENE GLYCOL 3350 17 G PO PACK
17.0000 g | PACK | Freq: Every day | ORAL | Status: DC
  Administered 2022-09-25: 17 g
  Filled 2022-09-24: qty 1

## 2022-09-24 MED ORDER — SODIUM BICARBONATE 8.4 % IV SOLN: 100.0000 meq | Freq: Once | INTRAVENOUS | Status: AC

## 2022-09-24 MED ORDER — FAMOTIDINE 20 MG PO TABS: 20.0000 mg | ORAL_TABLET | Freq: Two times a day (BID) | ORAL | Status: DC

## 2022-09-24 MED ORDER — FENTANYL 2500MCG IN NS 250ML (10MCG/ML) PREMIX INFUSION
50.0000 ug/h | INTRAVENOUS | Status: DC
  Administered 2022-09-24 – 2022-09-25 (×2): 200 ug/h via INTRAVENOUS
  Filled 2022-09-24 (×2): qty 250

## 2022-09-24 MED ORDER — ACETAMINOPHEN 160 MG/5ML PO SOLN
650.0000 mg | ORAL | Status: DC
  Administered 2022-09-25 (×3): 650 mg
  Filled 2022-09-24 (×4): qty 20.3

## 2022-09-24 MED ORDER — VASOPRESSIN 20 UNITS/100 ML INFUSION FOR SHOCK
0.0000 [IU]/min | INTRAVENOUS | Status: DC
  Administered 2022-09-24 – 2022-09-25 (×2): 0.03 [IU]/min via INTRAVENOUS
  Filled 2022-09-24 (×2): qty 100

## 2022-09-24 MED ORDER — BUSPIRONE HCL 10 MG PO TABS
30.0000 mg | ORAL_TABLET | Freq: Three times a day (TID) | ORAL | Status: DC | PRN
  Filled 2022-09-24: qty 3

## 2022-09-24 MED ORDER — DEXMEDETOMIDINE HCL IN NACL 400 MCG/100ML IV SOLN
0.0000 ug/kg/h | INTRAVENOUS | Status: DC
  Administered 2022-09-25: 0.4 ug/kg/h via INTRAVENOUS
  Administered 2022-09-25: 0.6 ug/kg/h via INTRAVENOUS
  Filled 2022-09-24 (×2): qty 100

## 2022-09-24 MED ORDER — ACETAMINOPHEN 325 MG PO TABS: 650.0000 mg | ORAL_TABLET | ORAL | Status: DC

## 2022-09-24 MED ORDER — FENTANYL BOLUS VIA INFUSION: 50.0000 ug | INTRAVENOUS | Status: DC | PRN

## 2022-09-24 MED ORDER — LORAZEPAM 2 MG/ML IJ SOLN
INTRAMUSCULAR | Status: AC
  Filled 2022-09-24: qty 1

## 2022-09-24 MED ORDER — INSULIN ASPART 100 UNIT/ML IJ SOLN: 0.0000 [IU] | INTRAMUSCULAR | Status: DC

## 2022-09-24 MED ORDER — EPINEPHRINE 1 MG/10ML IJ SOSY
PREFILLED_SYRINGE | INTRAMUSCULAR | Status: AC
  Filled 2022-09-24: qty 10

## 2022-09-24 MED ORDER — STERILE WATER FOR INJECTION IV SOLN
INTRAVENOUS | Status: DC
  Filled 2022-09-24 (×3): qty 1000

## 2022-09-24 MED ORDER — ACETAMINOPHEN 650 MG RE SUPP: 650.0000 mg | RECTAL | Status: DC | PRN

## 2022-09-24 MED ORDER — FENTANYL 2500MCG IN NS 250ML (10MCG/ML) PREMIX INFUSION
0.0000 ug/h | INTRAVENOUS | Status: DC
  Administered 2022-09-24: 25 ug/h via INTRAVENOUS
  Filled 2022-09-24: qty 250

## 2022-09-24 MED ORDER — SODIUM BICARBONATE 8.4 % IV SOLN
INTRAVENOUS | Status: AC
  Administered 2022-09-24: 100 meq via INTRAVENOUS
  Filled 2022-09-24: qty 50

## 2022-09-24 NOTE — ED Notes (Signed)
Pulse Check No pulse, continued CPR

## 2022-09-24 NOTE — ED Notes (Signed)
CBG  above 600 mg/dl  RN and MD notified

## 2022-09-24 NOTE — ED Notes (Addendum)
Positive Pulses, ROSC noted, CPR stopped at this time Norepi drip started at 2mg /min per protocol  Pt placed on Vent per RT

## 2022-09-24 NOTE — Consult Note (Deleted)
NAME:  ELVA BREAKER, MRN:  [id number], DOB:  [birth year], LOS: 0 ADMISSION DATE:  09/24/2022, CONSULTATION DATE:  09/24/22 REFERRING MD:  [facility] EDP , CHIEF COMPLAINT: Post arrest x1hour   History of Present Illness:  57 yo male presented to [facility] with crushing chest pain, saw the waiting room full and left the ED via his wife picking him up. Per report they did not make it out of the hospital parking lot when he became unresponsive. Pt wife pulled back around and he was noted to be in vfib. He was shocked and broke vfib but reportedly did not have a pulse (suppose pea) then proceeded to code for ~1hour (see code documentation for complete details) 2/2 family wanting to do a video and have minister there. Pt achieved ROSC after 1 hour of acls. He then was noted to have seizure like activity. On 6 epi and 10 norepi, now on 10 norepi (all thru piv). Pt was noted to also have EKG changes consistent with inferior wall MI. Cardiology was contacted for cath but declined admission at this time, perhaps cath in the morning.   CCM was thus contacted for admission.   All history is obtained from chart and EDP as pt is unresponsive on vent.   Of note pt was seen 07/15/22 bu Dr Eden Emms with cardiology. "57 y.o. last seen by cardiology in 2015. Has not been seen in 2 years History of GERD, HLD and  DM-2.  Father and uncle had premature CAD I took care of his dad who had CABG. Had normal ETT 2015 Calcium score was 16 involving the proximal and mid LAD This was 73 rd percentile for age/sex. Cardiac CT 02/25/20 Calcium score 252 , 90 th percentile prox LAD with 50-60% plaque Napkin ring morphology FFR negative in LAD 0.87."   He was ordered for a exercise stress test and follow up in one year. ETT was reportedly normal.   Pt arrived to our ICU on versed infusion and fentanyl, appears to be purposeful. We will stop the versed infusion and do neuro exam. On levo, adding vaso. Cvc to be placed.   Pertinent  Medical  History  GERD DM2 Hyperlipidemia  Significant Hospital Events: Including procedures, antibiotic start and stop dates in addition to other pertinent events   ~1hour ACLS at [facility] 09/24/22 Transferred to [facility]   Interim History / Subjective:    Objective   Blood pressure 112/71, pulse (!) 121, temperature (!) 94.6 F (34.8 C), resp. rate 19, SpO2 100 %.    Vent Mode: PRVC FiO2 (%):  [100 %] 100 % Set Rate:  [20 bmp] 20 bmp Vt Set:  [800 mL] 800 mL PEEP:  [5 cmH20] 5 cmH20   Intake/Output Summary (Last 24 hours) at 09/24/2022 1935 Last data filed at 09/24/2022 1923 Gross per 24 hour  Intake --  Output 175 ml  Net -175 ml   There were no vitals filed for this visit.  Examination: General: arousable and following commands, vented HENT: ncat, eomi, perrla, mmmp Lungs: ctab Cardiovascular: tachy, regular Abdomen: soft, nt,nd bs+ Extremities: no c/c/e Neuro: arousable and following commands equal strength but some delay on L following GU: deferred  Resolved Hospital Problem list     Assessment & Plan:  [facility] s/p ROSC Vfib arrest Torsades arrest Acute STEMI:  CAD, with calcium score 252, FFR 0.84 in 2021 -titrate vasopressors to map >65 -normothermia protocol -anticipate need for cvc -inotropes if coox is <65 -discuss code status -cardiology  has been contacted and deferred cath at this time, now that we see he is following commands will contact cards to let them know should their plan change -heparin infusion if CTH without acute bleeding.  -neuro exams -statin, asa  Acute hypoxic resp failure req mechanical ventilation:  -titrate vent -sat/sbt when able -cxr upon arrival -abg upon arrival  Metabolic acidosis:  -s/p 1hr cardiac arrest -bicarb infusion Hyponatremia:  -monitor and ivf  Acute metabolic encephalopathy:  Seizure like activity -cth pending -ltm once completed -consult neuro if sz activity   T2dm, well controlled:  -A1c 6.3 -ssi        Best Practice (right click and "Reselect all SmartList Selections" daily)   Diet/type: NPO DVT prophylaxis: systemic heparin GI prophylaxis: H2B Lines: Central line Foley:  Yes, and it is still needed Code Status:  full code Last date of multidisciplinary goals of care discussion [tbd]  Labs   CBC: Recent Labs  Lab 09/24/22 1822 09/24/22 1921  WBC 9.9  --   NEUTROABS 5.4  --   HGB 15.9 13.6  HCT 44.4 40.0  MCV 86.7  --   PLT 160  --     Basic Metabolic Panel: Recent Labs  Lab 09/24/22 1822 09/24/22 1921  NA 129* 127*  K 4.1 4.7  CL 96*  --   CO2 20*  --   GLUCOSE 321*  --   BUN 15  --   CREATININE 1.04  --   CALCIUM 8.8*  --    GFR: CrCl cannot be calculated (Unknown ideal weight.). Recent Labs  Lab 09/24/22 1822  WBC 9.9    Liver Function Tests: Recent Labs  Lab 09/24/22 1822  AST 39  ALT 33  ALKPHOS 99  BILITOT 0.5  PROT 7.5  ALBUMIN 4.2   No results for input(s): "LIPASE", "AMYLASE" in the last 168 hours. No results for input(s): "AMMONIA" in the last 168 hours.  ABG    Component Value Date/Time   PHART 7.123 (LL) 09/24/2022 1921   PCO2ART 35.1 09/24/2022 1921   PO2ART 131 (H) 09/24/2022 1921   HCO3 11.9 (L) 09/24/2022 1921   TCO2 13 (L) 09/24/2022 1921   ACIDBASEDEF 17.0 (H) 09/24/2022 1921   O2SAT 98 09/24/2022 1921     Coagulation Profile: No results for input(s): "INR", "PROTIME" in the last 168 hours.  Cardiac Enzymes: No results for input(s): "CKTOTAL", "CKMB", "CKMBINDEX", "TROPONINI" in the last 168 hours.  HbA1C: No results found for: "HGBA1C"  CBG: Recent Labs  Lab 09/24/22 1756 09/24/22 1855  GLUCAP 346* >600*    Review of Systems:   Unobtainable 2/2 intubated status  Past Medical History:  He,  has a past medical history of GERD (gastroesophageal reflux disease), Hyperlipidemia, Recurrent UTI (06/30/2020), Renal cyst (08/17/2020), and Ventral hernia.   Surgical History:   Past Surgical History:   Procedure Laterality Date   ADENOIDECTOMY  ` 1972   BACK SURGERY     HERNIA REPAIR     LAMINECTOMY AND MICRODISCECTOMY LUMBAR SPINE  05/02/2014   [facility]   LUMBAR LAMINECTOMY FOR EPIDURAL ABSCESS N/A 05/02/2014   Procedure: LUMBAR LAMINECTOMY FOR EPIDURAL ABSCESS, lumbar four-five right;  Surgeon: Elaina Hoops, MD;  Location: [facility];  Service: Neurosurgery;  Laterality: N/A;   MICRODISCECTOMY LUMBAR  10/2001   "L3-4"   ORIF CLAVICULAR FRACTURE Left 07/1988   POSTERIOR LAMINECTOMY / DECOMPRESSION LUMBAR SPINE  10/18/2540   "H0-6"   UMBILICAL HERNIA REPAIR  ~ [place]  REPAIR  07/27/2011   Procedure: LAPAROSCOPIC VENTRAL HERNIA;  Surgeon: Edward Jolly, MD;  Location: [facility];  Service: General;  Laterality: N/A;   WRIST FRACTURE SURGERY Left 07/1988   with retained hardware     Social History:   reports that he has never smoked. He has never used smokeless tobacco. He reports that he does not drink alcohol and does not use drugs.   Family History:  His family history includes Heart disease in his father and paternal uncle.   Allergies Allergies  Allergen Reactions   Macrobid [Nitrofurantoin] Other (See Comments)   Morphine Nausea And Vomiting and Other (See Comments)   Niacin Rash     Home Medications  Prior to Admission medications   Medication Sig Start Date End Date Taking? Authorizing Provider  aspirin EC 81 MG tablet Take 1 tablet (81 mg total) by mouth daily. 01/29/20   Josue Hector, MD  atorvastatin (LIPITOR) 20 MG tablet TAKE 1 TABLET EVERY OTHER DAY ORALLY 30 DAY(S) 10/22/17   [provider]  Cholecalciferol (VITAMIN D3) 25 MCG (1000 UT) CAPS 1 capsule 02/28/20   [provider]  Multiple Vitamins-Minerals (MULTI COMPLETE) CAPS See admin instructions.    [provider]     Critical care time: 43 min between 1900-2232 excluding procedures.

## 2022-09-24 NOTE — Procedures (Signed)
Central Venous Catheter Insertion Procedure Note  Brian [name]  [id number]  1966/01/24  Date:09/24/22  Time:11:07 PM   Provider Performing:Dayon Witt D Rollene Rotunda   Procedure: Insertion of Non-tunneled Central Venous [phone]) with US guidance ([id number])   Indication(s) Medication administration  Consent Risks of the procedure as well as the alternatives and risks of each were explained to the patient and/or caregiver.  Consent for the procedure was obtained and is signed in the bedside chart  Anesthesia Topical only with 1% lidocaine   Timeout Verified patient identification, verified procedure, site/side was marked, verified correct patient position, special equipment/implants available, medications/allergies/relevant history reviewed, required imaging and test results available.  Sterile Technique Maximal sterile technique including full sterile barrier drape, hand hygiene, sterile gown, sterile gloves, mask, hair covering, sterile ultrasound probe cover (if used).  Procedure Description Area of catheter insertion was cleaned with chlorhexidine and draped in sterile fashion.  With real-time ultrasound guidance a central venous catheter was placed into the left internal jugular vein. Nonpulsatile blood flow and easy flushing noted in all ports.  The catheter was sutured in place and sterile dressing applied.  Complications/Tolerance None; patient tolerated the procedure well. Chest X-ray is ordered to verify placement for internal jugular or subclavian cannulation.   Chest x-ray is not ordered for femoral cannulation.  EBL Minimal  Specimen(s) None  JD Rexene Agent [facility] 09/24/2022, 11:08 PM  Please see [email or web address] for pager details.  From 7A-7P if no response, please call [phone]. After hours, please call ELink [phone].

## 2022-09-24 NOTE — ED Triage Notes (Signed)
Arrival by POV, lost pulses in parking lot, transferred to gurney and moved to Rm 14, placed on ZOLL and Cardiac Monitor Vomitus noted, suction per Respiratory  V-Fib Rhythm

## 2022-09-24 NOTE — ED Notes (Signed)
Pulse Check, No pulse Epi 1 gm Given, continued CPR

## 2022-09-24 NOTE — ED Notes (Addendum)
Pulse Check, PEA rhythm noted  Shock at 200J, continued CPR

## 2022-09-24 NOTE — ED Notes (Signed)
Total CPR time at 30 min, Continued CPR per provider

## 2022-09-24 NOTE — ED Notes (Addendum)
Large Bore IV est in Bilat AC's , NS 1L Bolus initiated  V-fib rhythm noted, Shock at 200J, continued CPR

## 2022-09-24 NOTE — Progress Notes (Signed)
[facility] Progress Note Patient Name: KENYA SHIRAISHI DOB: Jan 03, 1966 MRN: [id number]   Date of Service  09/24/2022  HPI/Events of Note  Brief New admit note:  59 male in ICU  S/P V fib to torsades to PEA arrest in Car- 40+ ACLS with ROSC. Witnessed seizure.  On Vent. Pressors, acidotic and on TTM. Inferior STEMI. No intervention from Interventional Cardiology at this time  Data: reviewed 7.12/35/131. Sodium 129, K 4.1 Cr 1 LFT normal Wbc normal. Trop 28. Lastest EKG Inferior ST elevation, reciprocal depression anterior leads.  UA no UTI CxR film seen: ET and Ng in place. No obvious CHF or pneumo or pneumonia.   Camera evaluation done: In synchrony with Vent, wean VT down. Rate 22, 100% fio2 HR 102 On levo/epi/fentanyl drip. Temp 36 degree  - as per ground CCM team, patient is waking up. Following commands. Re Interventional Cardiology to see whether he is a candidate for LHC tonight or in AM. -CT head - echo - on bicarb gtt, follow ABG, UOP - keep MAP > 65 - no need for any antibiotics   CBG goals < 180. on SSI.  Vent bundle: SAT-SBT daily when stable hemodynamics. Lung protective ventilation.   Amato Sevillano. Neomia Glass, MD, FCCP. [phone]  eICU Interventions  23:14 As per ground team : he is reponding to commands, more awake. Called Dr Humphrey Rolls, Cardiology fellow for any LHC tonight?. He advised a stat EKG, if still has ST elevation, would consider LHC tonight only. Family wishing same. Dr Humphrey Rolls will follow through     Intervention Category Major Interventions: Respiratory failure - evaluation and management;Arrhythmia - evaluation and management;Shock - evaluation and management;Seizures - evaluation and management Evaluation Type: New Patient Evaluation  Elmer Sow 09/24/2022, 10:23 PM  00:08 Hyperglycemia, acidosis, AG elevated.  Treatit as DKA, protocol ordered. No mention of DM, but HbA1C > 10. Follow BMP q4 hrly, mag.  Cards note reviewed.  Medical Rx for now.

## 2022-09-24 NOTE — ED Notes (Signed)
Pulse Check, Shock at 200J, Vfib, Continue CPR

## 2022-09-24 NOTE — ED Notes (Signed)
NO pulse, Continued CPR

## 2022-09-24 NOTE — ED Notes (Signed)
Pulse Check, No pulse Continue CPR,  EPI 1mg  GIVEN  Shock again 200J,   Intubated by Richardson Landry RT, 7.5, 24 at the teeth, visualized at chords

## 2022-09-24 NOTE — ED Notes (Signed)
Pulse Check, Positive pulse , Continued Compressions

## 2022-09-24 NOTE — ED Notes (Signed)
[facility] Called for Transport for admission

## 2022-09-24 NOTE — ED Notes (Signed)
Pt movement noted to right side, blinking and moving right arm, starting to fight tube, provider made aware

## 2022-09-24 NOTE — ED Notes (Signed)
No pulse, Continued CPR

## 2022-09-24 NOTE — ED Notes (Addendum)
Epi 1 mg Given, Continued CPR

## 2022-09-24 NOTE — ED Notes (Signed)
Total Epi x 6 Total Shock at 200J x 7 during code

## 2022-09-24 NOTE — H&P (Signed)
NAME:  Brian [name], MRN:  [id number], DOB:  05/07/1966, LOS: 0 ADMISSION DATE:  09/24/2022, CONSULTATION DATE:  09/24/22 REFERRING MD:  [facility] EDP , CHIEF COMPLAINT: Post arrest x1hour   History of Present Illness:  57 yo male presented to [facility] with crushing chest pain, saw the waiting room full and left the ED via his wife picking him up. Per report they did not make it out of the hospital parking lot when he became unresponsive. Pt wife pulled back around and he was noted to be in vfib. He was shocked and broke vfib but reportedly did not have a pulse (suppose pea) then proceeded to code for ~1hour (see code documentation for complete details) 2/2 family wanting to do a video and have minister there. Pt achieved ROSC after 1 hour of acls. He then was noted to have seizure like activity. On 6 epi and 10 norepi, now on 10 norepi (all thru piv). Pt was noted to also have EKG changes consistent with inferior wall MI. Cardiology was contacted for cath but declined admission at this time, perhaps cath in the morning.   CCM was thus contacted for admission.   All history is obtained from chart and EDP as pt is unresponsive on vent.   Of note pt was seen 07/15/22 bu Dr Johnsie Cancel with cardiology. "57 y.o. last seen by cardiology in 2015. Has not been seen in 2 years History of GERD, HLD and  DM-2.  Father and uncle had premature CAD I took care of his dad who had CABG. Had normal ETT 2015 Calcium score was 16 involving the proximal and mid LAD This was 73 rd percentile for age/sex. Cardiac CT 02/25/20 Calcium score 252 , 90 th percentile prox LAD with 50-60% plaque Napkin ring morphology FFR negative in LAD 0.87."   He was ordered for a exercise stress test and follow up in one year. ETT was reportedly normal.   Pt arrived to our ICU on versed infusion and fentanyl, appears to be purposeful. We will stop the versed infusion and do neuro exam. On 19mch levo, adding vaso. Cvc to be placed.   Pertinent  Medical  History  GERD DM2 Hyperlipidemia  Significant Hospital Events: Including procedures, antibiotic start and stop dates in addition to other pertinent events   ~1hour ACLS at [facility] 09/24/22 Transferred to [facility]   Interim History / Subjective:    Objective   Blood pressure 104/70, pulse (!) 108, temperature 100.2 F (37.9 C), temperature source (P) Bladder, resp. rate (!) 28, height 5\' 10"  (1.778 m), SpO2 98 %.    Vent Mode: PRVC FiO2 (%):  [100 %] 100 % Set Rate:  [20 bmp-28 bmp] 28 bmp Vt Set:  [580 mL-800 mL] 580 mL PEEP:  [5 cmH20] 5 cmH20 Plateau Pressure:  [19 cmH20] 19 cmH20   Intake/Output Summary (Last 24 hours) at 09/24/2022 2233 Last data filed at 09/24/2022 1923 Gross per 24 hour  Intake --  Output 175 ml  Net -175 ml    There were no vitals filed for this visit.  Examination: General: arousable and following commands, vented HENT: ncat, eomi, perrla, mmmp Lungs: ctab Cardiovascular: tachy, regular Abdomen: soft, nt,nd bs+ Extremities: no c/c/e Neuro: arousable and following commands equal strength but some delay on L following GU: deferred  Resolved Hospital Problem list     Assessment & Plan:  IHCA s/p ROSC Vfib arrest Torsades arrest Acute STEMI:  CAD, with calcium score 252, FFR 0.84 in 2021 -titrate  vasopressors to map >65 -normothermia protocol -anticipate need for cvc -inotropes if coox is <65 -discuss code status -cardiology has been contacted and deferred cath at this time, now that we see he is following commands will contact cards to let them know should their plan change -heparin infusion if CTH without acute bleeding.  -neuro exams -statin, asa  Acute hypoxic resp failure req mechanical ventilation:  -titrate vent -sat/sbt when able -cxr upon arrival -abg upon arrival  Metabolic acidosis:  -s/p 1hr cardiac arrest -bicarb infusion Hyponatremia:  -monitor and ivf  Acute metabolic encephalopathy:  Seizure like activity -cth  pending -ltm once completed -consult neuro if sz activity   T2dm, well controlled:  -A1c 6.3 -ssi       Best Practice (right click and "Reselect all SmartList Selections" daily)   Diet/type: NPO DVT prophylaxis: systemic heparin GI prophylaxis: H2B Lines: Central line Foley:  Yes, and it is still needed Code Status:  full code Last date of multidisciplinary goals of care discussion [tbd]  Labs   CBC: Recent Labs  Lab 09/24/22 1822 09/24/22 1921  WBC 9.9  --   NEUTROABS 5.4  --   HGB 15.9 13.6  HCT 44.4 40.0  MCV 86.7  --   PLT 160  --      Basic Metabolic Panel: Recent Labs  Lab 09/24/22 1822 09/24/22 1921  NA 129* 127*  K 4.1 4.7  CL 96*  --   CO2 20*  --   GLUCOSE 321*  --   BUN 15  --   CREATININE 1.04  --   CALCIUM 8.8*  --     GFR: CrCl cannot be calculated (Unknown ideal weight.). Recent Labs  Lab 09/24/22 1822  WBC 9.9     Liver Function Tests: Recent Labs  Lab 09/24/22 1822  AST 39  ALT 33  ALKPHOS 99  BILITOT 0.5  PROT 7.5  ALBUMIN 4.2    No results for input(s): "LIPASE", "AMYLASE" in the last 168 hours. No results for input(s): "AMMONIA" in the last 168 hours.  ABG    Component Value Date/Time   PHART 7.123 (LL) 09/24/2022 1921   PCO2ART 35.1 09/24/2022 1921   PO2ART 131 (H) 09/24/2022 1921   HCO3 11.9 (L) 09/24/2022 1921   TCO2 13 (L) 09/24/2022 1921   ACIDBASEDEF 17.0 (H) 09/24/2022 1921   O2SAT 98 09/24/2022 1921     Coagulation Profile: No results for input(s): "INR", "PROTIME" in the last 168 hours.  Cardiac Enzymes: No results for input(s): "CKTOTAL", "CKMB", "CKMBINDEX", "TROPONINI" in the last 168 hours.  HbA1C: No results found for: "HGBA1C"  CBG: Recent Labs  Lab 09/24/22 1756 09/24/22 1855  GLUCAP 346* >600*     Review of Systems:   Unobtainable 2/2 intubated status  Past Medical History:  He,  has a past medical history of GERD (gastroesophageal reflux disease), Hyperlipidemia,  Recurrent UTI (06/30/2020), Renal cyst (08/17/2020), and Ventral hernia.   Surgical History:   Past Surgical History:  Procedure Laterality Date   ADENOIDECTOMY  ` 1972   BACK SURGERY     HERNIA REPAIR     LAMINECTOMY AND MICRODISCECTOMY LUMBAR SPINE  05/02/2014   [facility]   LUMBAR LAMINECTOMY FOR EPIDURAL ABSCESS N/A 05/02/2014   Procedure: LUMBAR LAMINECTOMY FOR EPIDURAL ABSCESS, lumbar four-five right;  Surgeon: Mariam Dollar, MD;  Location: [facility];  Service: Neurosurgery;  Laterality: N/A;   MICRODISCECTOMY LUMBAR  10/2001   "L3-4"   ORIF CLAVICULAR FRACTURE Left 07/1988  POSTERIOR LAMINECTOMY / DECOMPRESSION LUMBAR SPINE  12/21/9561   "O7-5"   UMBILICAL HERNIA REPAIR  ~ 2011   VENTRAL HERNIA REPAIR  07/27/2011   Procedure: LAPAROSCOPIC VENTRAL HERNIA;  Surgeon: Edward Jolly, MD;  Location: [facility] ORS;  Service: General;  Laterality: N/A;   WRIST FRACTURE SURGERY Left 07/1988   with retained hardware     Social History:   reports that he has never smoked. He has never used smokeless tobacco. He reports that he does not drink alcohol and does not use drugs.   Family History:  His family history includes Heart disease in his father and paternal uncle.   Allergies Allergies  Allergen Reactions   Macrobid [Nitrofurantoin] Other (See Comments)   Morphine Nausea And Vomiting and Other (See Comments)   Niacin Rash     Home Medications  Prior to Admission medications   Medication Sig Start Date End Date Taking? Authorizing Provider  aspirin EC 81 MG tablet Take 1 tablet (81 mg total) by mouth daily. 01/29/20   Josue Hector, MD  atorvastatin (LIPITOR) 20 MG tablet TAKE 1 TABLET EVERY OTHER DAY ORALLY 30 DAY(S) 10/22/17   [provider]  Cholecalciferol (VITAMIN D3) 25 MCG (1000 UT) CAPS 1 capsule 02/28/20   [provider]  Multiple Vitamins-Minerals (MULTI COMPLETE) CAPS See admin instructions.    [provider]     Critical care time: 43 min  between 1900-2232 excluding procedures.

## 2022-09-24 NOTE — ED Notes (Signed)
CL departed with patient. Sunday Spillers, wife, [phone].

## 2022-09-24 NOTE — ED Notes (Addendum)
Bicarb 26meq and Atropine 1mg  Given

## 2022-09-24 NOTE — ED Notes (Signed)
2mg  Ativan given for seizure activity

## 2022-09-24 NOTE — ED Notes (Signed)
[facility] at bedside to transport pt to [facility], pt stable for transport at this time

## 2022-09-24 NOTE — Consult Note (Signed)
Cardiology Consultation   Patient ID: Brian [name] MRN: [id number]; DOB: Jan 16, 1966  Admit date: 09/24/2022 Date of Consult: 09/24/2022  PCP:  Lawerance Cruel, [facility] Cardiologist:  Brian Rouge, MD        Patient Profile:   Brian [name] is a 57 y.o. male with a hx of  GERD, HLD and DM 2  who is being seen 09/24/2022 for the evaluation of post cardiac arrest (Inferior STEMI) at the request of Brian [name].   History of Present Illness:   Brian [name] is a 57 y.o. male with a hx of  GERD, HLD and DM 2  who is being seen 09/24/2022 for the evaluation of post cardiac arrest (Inferior STEMI) at the request of Brian [name]. Pt was seen 07/15/22 by Brian [name] with cardiology. Cardiac CT 02/25/20 Calcium score 252 , 90 th percentile prox LAD with 50-60% plaque Napkin ring morphology FFR negative in LAD 0.87. He was doing well this afternoon- when he started having CP. He had crushing CP. Per report they did not make it out of the hospital parking lot when he became unresponsive. Pt wife pulled back around and he was noted to be in vfib. He was at [facility]. Per reports, he had ROSC after approximately 50-60 minutes. He was shocked 7 times and had 7 Epi. He then was noted to have seizure like activity. On 6 epi and 10 norepi, he was transferred here. EKG post ROSC showed ST elevations in inferior leads with reciprocal ST depressions in lateral leads. Cardiology was called for possible LHC activation. At that time, Brian [name] suggested we hold off for now considering 60 min down time- and stabilize him first and get him transferred here first. Eventually EKG were repeated when he arrived here, EKG showed that these ST elevations in inferior leads had minimized. Patient was moving all extremities; and seemed like he was following commands.   Past Medical History:  Diagnosis Date   GERD (gastroesophageal reflux disease)    hx. reflux.   Hyperlipidemia    tx. with  meds(high triglycerides)   Recurrent UTI 06/30/2020   Renal cyst 08/17/2020   Ventral hernia     Past Surgical History:  Procedure Laterality Date   ADENOIDECTOMY  ` 1972   BACK SURGERY     HERNIA REPAIR     LAMINECTOMY AND MICRODISCECTOMY LUMBAR SPINE  05/02/2014   red[facility]   LUMBAR LAMINECTOMY FOR EPIDURAL ABSCESS N/A 05/02/2014   Procedure: LUMBAR LAMINECTOMY FOR EPIDURAL ABSCESS, lumbar four-five right;  Surgeon: Elaina Hoops, MD;  Location: [facility];  Service: Neurosurgery;  Laterality: N/A;   MICRODISCECTOMY LUMBAR  10/2001   "L3-4"   ORIF CLAVICULAR FRACTURE Left 07/1988   POSTERIOR LAMINECTOMY / DECOMPRESSION LUMBAR SPINE  7/90/2409   "B3-5"   UMBILICAL HERNIA REPAIR  ~ 2011   VENTRAL HERNIA REPAIR  07/27/2011   Procedure: LAPAROSCOPIC VENTRAL HERNIA;  Surgeon: Edward Jolly, MD;  Location: [facility];  Service: General;  Laterality: N/A;   WRIST FRACTURE SURGERY Left 07/1988   with retained hardware       Inpatient Medications: Scheduled Meds:  [START ON 09/25/2022] acetaminophen  650 mg Oral Q4H   Or   [START ON 09/25/2022] acetaminophen (TYLENOL) oral liquid 160 mg/5 mL  650 mg Per Tube Q4H   Or   [START ON 09/25/2022] acetaminophen  650 mg Rectal Q4H   [START ON 09/25/2022] atorvastatin  20 mg Per Tube  Daily   [START ON 09/25/2022] Chlorhexidine Gluconate Cloth  6 each Topical Daily   docusate  100 mg Per Tube BID   EPINEPHrine       famotidine  20 mg Per Tube BID   [START ON 09/25/2022] insulin aspart  0-15 Units Subcutaneous Q4H   [START ON 09/25/2022] polyethylene glycol  17 g Per Tube Daily   Continuous Infusions:  fentaNYL infusion INTRAVENOUS 200 mcg/hr (09/24/22 2300)   midazolam 2 mg/hr (09/24/22 2300)   norepinephrine (LEVOPHED) Adult infusion 20 mcg/min (09/24/22 2332)   sodium bicarbonate 150 mEq in sterile water 1,150 mL infusion     vasopressin     PRN Meds: [START ON 09/26/2022] acetaminophen **OR** [START ON 09/26/2022] acetaminophen (TYLENOL) oral  liquid 160 mg/5 mL **OR** [START ON 09/26/2022] acetaminophen, busPIRone **OR** busPIRone, docusate, EPINEPHrine, fentaNYL, polyethylene glycol  Allergies:    Allergies  Allergen Reactions   Macrobid [Nitrofurantoin] Other (See Comments)   Morphine Nausea And Vomiting and Other (See Comments)   Niacin Rash    Social History:   Social History   Socioeconomic History   Marital status: Married    Spouse name: Not on file   Number of children: Not on file   Years of education: Not on file   Highest education level: Not on file  Occupational History   Not on file  Tobacco Use   Smoking status: Never   Smokeless tobacco: Never  Substance and Sexual Activity   Alcohol use: No   Drug use: No   Sexual activity: Not Currently  Other Topics Concern   Not on file  Social History Narrative   Not on file   Social Determinants of Health   Financial Resource Strain: Not on file  Food Insecurity: Not on file  Transportation Needs: Not on file  Physical Activity: Not on file  Stress: Not on file  Social Connections: Not on file  Intimate Partner Violence: Not on file    Family History:   Family History  Problem Relation Age of Onset   Heart disease Father    Heart disease Paternal Uncle      ROS:  Please see the history of present illness.  All other ROS reviewed and negative.     Physical Exam/Data:   Vitals:   09/24/22 2235 09/24/22 2240 09/24/22 2245 09/24/22 2250  BP: (!) 152/91 120/77 107/71 96/65  Pulse: (!) 118 (!) 113 (!) 110 (!) 110  Resp: 17 16 16  (!) 23  Temp: (!) 100.6 F (38.1 C) (!) 100.6 F (38.1 C) (!) 100.6 F (38.1 C) (!) 100.6 F (38.1 C)  TempSrc:      SpO2: 99% 99% 98% 98%  Weight:      Height:        Intake/Output Summary (Last 24 hours) at 09/24/2022 2335 Last data filed at 09/24/2022 2300 Gross per 24 hour  Intake 204.72 ml  Output 675 ml  Net -470.28 ml      09/24/2022   10:00 PM 07/15/2022    7:54 AM 08/17/2020   10:01 AM  Last 3  Weights  Weight (lbs) 197 lb 8.5 oz 201 lb 12.8 oz 208 lb  Weight (kg) 89.6 kg 91.536 kg 94.348 kg     Body mass index is 28.34 kg/m.  General:  Intubated and sedated HEENT: normal Neck: no JVD Vascular: No carotid bruits; Distal pulses 2+ bilaterally Cardiac:  normal S1, S2; RRR; no murmur  Lungs:  Mechanical BS Abd: soft, nontender, no hepatomegaly  Ext: no edema Musculoskeletal:  No deformities, BUE and BLE strength normal and equal Skin: warm and dry  Neuro:  Cannot assess Psych:  Cannot assess  EKG:  The EKG was personally reviewed and demonstrates: ST elevations in inferior leads with reciprocal ST depressions in lateral leads. These ST elevations in inferior leads have minimized on subsequent EKGs.   Relevant CV Studies:  Cardiac CT 02/25/20 Calcium score 252 , 90 th percentile prox LAD with 50-60% plaque Napkin ring morphology FFR negative in LAD 0.87   Laboratory Data:  High Sensitivity Troponin:   Recent Labs  Lab 09/24/22 1823  TROPONINIHS 28*     Chemistry Recent Labs  Lab 09/24/22 1822 09/24/22 1921 09/24/22 2323  NA 129* 127* 135  K 4.1 4.7 5.7*  CL 96*  --   --   CO2 20*  --   --   GLUCOSE 321*  --   --   BUN 15  --   --   CREATININE 1.04  --   --   CALCIUM 8.8*  --   --   GFRNONAA >60  --   --   ANIONGAP 13  --   --     Recent Labs  Lab 09/24/22 1822  PROT 7.5  ALBUMIN 4.2  AST 39  ALT 33  ALKPHOS 99  BILITOT 0.5   Lipids No results for input(s): "CHOL", "TRIG", "HDL", "LABVLDL", "LDLCALC", "CHOLHDL" in the last 168 hours.  Hematology Recent Labs  Lab 09/24/22 1822 09/24/22 1921 09/24/22 2323  WBC 9.9  --   --   RBC 5.12  --   --   HGB 15.9 13.6 14.3  HCT 44.4 40.0 42.0  MCV 86.7  --   --   MCH 31.1  --   --   MCHC 35.8  --   --   RDW 12.4  --   --   PLT 160  --   --    Thyroid No results for input(s): "TSH", "FREET4" in the last 168 hours.  BNPNo results for input(s): "BNP", "PROBNP" in the last 168 hours.  DDimer No  results for input(s): "DDIMER" in the last 168 hours.   Radiology/Studies:  DG Chest Portable 1 View  Result Date: 09/24/2022 CLINICAL DATA:  Cardiac arrest. EXAM: PORTABLE CHEST 1 VIEW COMPARISON:  None Available. FINDINGS: Endotracheal tube tip is approximately 3.5 cm from the carina. Enteric tube is below the diaphragm not included in the field of view. Lung volumes are low. Heart size upper normal. Grossly normal mediastinal contours for technique. No confluent airspace disease, large pleural effusion or visible pneumothorax. Right humeral head arthroplasty. Question postsurgical change of the left acromioclavicular joint. No grossly displaced rib fractures are seen. IMPRESSION: 1. Endotracheal tube tip approximately 3.5 cm from the carina. Enteric tube is below the diaphragm not included in the field of view. 2. Low lung volumes. Electronically Signed   By: Narda Rutherford M.D.   On: 09/24/2022 20:26     Assessment and Plan:   # Cardiac Arrest # Inferior STEMI # CAD # Shock # DM 2  -Patient presented with crushing pain- Had Vfib arrest for approx 60 minutes -He was shocked 7 times and had 7 rounds of Epi. EKG post ROSC showed ST elevations in inferior leads with reciprocal ST depressions in lateral leads. Cardiology was called for possible LHC activation. At that time, Brian Clifton James (interventionalist on call) suggested we hold off for now considering 60 min down time- and stabilize  him first and get him transferred here first. Eventually EKG were repeated when he arrived here, EKG showed that these ST elevations in inferior leads had minimized. Decision was made to manage medically by Brian [name] (interventionalist on call) -Load aspirin 325 mg -Aspirin 81 daily -Heparin infusion for ACS -Atorvastatin 80 mg -Hold BB and ACE-I considering low BP -Maintain MAP >65. Wean Levo as needed -Echo -Will eventually need LHC -Vent and Neuro management per CCM.   For questions or updates,  please contact [place] Please consult [email or web address] for contact info under    Signed, Jaci Lazier, MD  09/24/2022 11:35 PM

## 2022-09-24 NOTE — ED Notes (Signed)
Shock again at [place] 1mg  GIVEN, Continued CPR

## 2022-09-24 NOTE — ED Notes (Addendum)
[facility] in Route to Transport pt to [facility] Report given to [place], ETA 15 min

## 2022-09-24 NOTE — ED Notes (Signed)
Continued CPR at this time, NO pulse

## 2022-09-24 NOTE — ED Notes (Signed)
Report called to [place], RN at [facility]

## 2022-09-24 NOTE — ED Notes (Signed)
Epi 1mg given.

## 2022-09-24 NOTE — ED Notes (Signed)
Pulse Check, Shock at 200J  Continued CPR

## 2022-09-24 NOTE — ED Notes (Signed)
Vfib Shock again at [place],  EPI 1mg  Given , CONTINUED CPR

## 2022-09-24 NOTE — Progress Notes (Signed)
Admission

## 2022-09-24 NOTE — ED Notes (Signed)
Pt family at bedside with pt, continued monitoring, Sinus arrhythmia noted, EKG obtained

## 2022-09-24 NOTE — ED Provider Notes (Addendum)
[place] [facility]ERGENCY DEPARTMENT AT [facility] [place] Provider Note   CSN: [id number] Arrival date & time: 09/24/22  1749     History  Chief Complaint  Patient presents with   Cardiac Arrest    Brian [name] is a 57 y.o. male.  Patient brought to the emergency department by his wife.  Patient had about an hour and a half complaint of heaviness on his chest.  For the past several days he stated he was not feeling quite right but did not seem to be complaining of pain.  Patient has a very strong family history of coronary artery disease.  Was seen by [facility] heart care doctor admission on November 17.  Patient had a normal ETT 2015 calcium score was 16 involving the proximal and mid LAD this was 73rd percentile for age sex.  Patient had a cardiac CT on February 25, 2020 had a calcium score of 252 90th percentile proximal LAD with 50 to 60% plaque napkin ring morphology FFR negative and LAD 0.87.  Past medical history also significant for hyperlipidemia.  Cardiology opting at that time it appears from their note for medical management.  Patient arrived to our ED got out of the vehicle walked into the waiting room.  Waiting room he felt was too crowded so he contacted his wife again to come pick him up  picked him up he got in the vehicle I did not even get out of the parking lot and he went unresponsive.  Immediately brought back CPR started getting him out of the car.  Brought to [facility].  Patient was hooked up to monitors.  Automatic CPR applied.  Patient had no pulse.  CPR paused.  Appeared to be ventricular fibrillation patient was shocked.  Patient had return of rhythm but did not have return of pulse.  That did not last long.  Refer to the rest of the note for the details of the rest of the resuscitation.       Home Medications Prior to Admission medications   Medication Sig Start Date End Date Taking? Authorizing Provider  aspirin EC 81 MG tablet Take 1  tablet (81 mg total) by mouth daily. 01/29/20   Josue Hector, MD  atorvastatin (LIPITOR) 20 MG tablet TAKE 1 TABLET EVERY OTHER DAY ORALLY 30 DAY(S) 10/22/17   [provider]  Cholecalciferol (VITAMIN D3) 25 MCG (1000 UT) CAPS 1 capsule 02/28/20   [provider]  Multiple Vitamins-Minerals (MULTI COMPLETE) CAPS See admin instructions.    [provider]      Allergies    Macrobid [nitrofurantoin], Morphine, and Niacin    Review of Systems   Review of Systems  Unable to perform ROS: Patient unresponsive    Physical Exam Updated Vital Signs BP (!) 136/94   Pulse (!) 125   Temp (!) 95.8 F (35.4 C)   Resp (!) 22   SpO2 100%  Physical Exam Vitals and nursing note reviewed.  Constitutional:      General: He is in acute distress.     Appearance: He is well-developed.     Comments: Patient in cardiac arrest.  HENT:     Head: Normocephalic and atraumatic.     Mouth/Throat:     Comments: Some vomit. Eyes:     Conjunctiva/sclera: Conjunctivae normal.  Cardiovascular:     Heart sounds: No murmur heard.    Comments: CPR ongoing no pulse. Pulmonary:     Effort: Respiratory distress  present.     Comments: Bilateral breath sounds with bagging. Abdominal:     Palpations: Abdomen is soft.     Tenderness: There is no abdominal tenderness.  Musculoskeletal:        General: No swelling.     Cervical back: Neck supple.     Right lower leg: No edema.     Left lower leg: No edema.  Skin:    General: Skin is warm and dry.  Neurological:     Comments: Patient initially completely unresponsive.  But when we for started CPR occasionally has some deep gas of breath.  And occasionally when he was shocked with call out with pain but this was early on.  And that went away.  Psychiatric:        Mood and Affect: Mood normal.     ED Results / Procedures / Treatments   Labs (all labs ordered are listed, but only abnormal results are displayed) Labs Reviewed   COMPREHENSIVE METABOLIC PANEL - Abnormal; Notable for the following components:      Result Value   Sodium 129 (*)    Chloride 96 (*)    CO2 20 (*)    Glucose, Bld 321 (*)    Calcium 8.8 (*)    All other components within normal limits  CBG MONITORING, ED - Abnormal; Notable for the following components:   Glucose-Capillary 346 (*)    All other components within normal limits  CBG MONITORING, ED - Abnormal; Notable for the following components:   Glucose-Capillary >600 (*)    All other components within normal limits  I-STAT ARTERIAL BLOOD GAS, ED - Abnormal; Notable for the following components:   pH, Arterial 7.123 (*)    pO2, Arterial 131 (*)    Bicarbonate 11.9 (*)    TCO2 13 (*)    Acid-base deficit 17.0 (*)    Sodium 127 (*)    Calcium, Ion 1.11 (*)    All other components within normal limits  TROPONIN I (HIGH SENSITIVITY) - Abnormal; Notable for the following components:   Troponin I (High Sensitivity) 28 (*)    All other components within normal limits  CBC WITH DIFFERENTIAL/PLATELET  URINALYSIS, ROUTINE W REFLEX MICROSCOPIC  TROPONIN I (HIGH SENSITIVITY)    EKG None  Radiology No results found.  Procedures Date/Time: 09/24/2022 7:49 PM  Performed by: Vanetta Mulders, MDOxygen Delivery Method: Ambu bag Preoxygenation: Pre-oxygenation with 100% oxygen Ventilation: Mask ventilation without difficulty Laryngoscope Size: Glidescope and 4 Tube size: 7.5 mm Number of attempts: 1 Airway Equipment and Method: Rigid stylet Secured at: 24 cm Tube secured with: ETT holder Comments: Patient was performed as part of cardiac arrest and resuscitation.        Medications Ordered in ED Medications  norepinephrine (LEVOPHED) 4mg  in (0.016 mg/mL) premix infusion (2 mcg/min Intravenous New Bag/Given 09/24/22 1856)  midazolam (VERSED) 100 mg/100 mL (1 mg/mL) premix infusion (0.5 mg/hr Intravenous New Bag/Given 09/24/22 1938)  fentaNYL 09/26/22 in NS  (84mcg/ml) infusion-PREMIX (25 mcg/hr Intravenous New Bag/Given 09/24/22 1939)  LORazepam (ATIVAN) 2 MG/ML injection (  Given 09/24/22 1821)  midazolam (VERSED) injection 4 mg (4 mg Intravenous Given 09/24/22 1933)    ED Course/ Medical Decision Making/ A&P                             Medical Decision Making Amount and/or Complexity of Data Reviewed Labs: ordered. Radiology: ordered.  Risk Prescription drug management.  Decision regarding hospitalization.   CRITICAL CARE Performed by: Fredia Sorrow Total critical care time: 100 minutes Critical care time was exclusive of separately billable procedures and treating other patients. Critical care was necessary to treat or prevent imminent or life-threatening deterioration. Critical care was time spent personally by me on the following activities: development of treatment plan with patient and/or surrogate as well as nursing, discussions with consultants, evaluation of patient's response to treatment, examination of patient, obtaining history from patient or surrogate, ordering and performing treatments and interventions, ordering and review of laboratory studies, ordering and review of radiographic studies, pulse oximetry and re-evaluation of patient's condition.  Following the initial shock for V-fib.  CPR was continued.  He ended up getting shocked for V-fib 7 times and got epinephrine 6 times IV also received bicarb atropine at 1 point time with a heart rate seem to be slow.  Blood sugar was checked and was 300.  EKG was also done not immediately but early on seem to be possibly consistent with torsades.  So patient also received magnesium.  Patient also did receive amiodarone because it was refractory ventricular fibrillation.  Also early on patient was intubated.  About 20 minutes patient had a brief return of circulation but it did not hold.  There was no purposeful movement.  Pupils at this time were nonreactive.  Family was  contacted and discussed the overall situation at this point in time is about 40 minutes into the CPR.  We did not expect a good outcome at this point in time.  Family wanted to talk to children before we stop CPR.  She did talk to family and we held CPR at this point in time though he was starting to do some movement.  Is important to note that prior to this he had some seizure-like activity on the left side left arm and left leg.  Patient did receive Ativan for that.  Patient ended up having return of spontaneous circulation at about an hour out.  Went on to start have movement of his right upper extremity right lower extremity and was purposeful.  Patient started breathing on his own.  Once we had return of spontaneous circulation.  He was started on norepinephrine drip.  Titrated up to 10 and kept there.  Patient's blood pressure was like 409 systolic.  Heart rate a little tachycardic around 130.  Repeat EKG was consistent with a an inferior posterior acute MI.  Contacted critical care and cardiology.  Interventional cardiology decided not to take him to Cath Lab.  Critical care will be excepting.  ICU bed available at [facility].  Discussed with Dr. Ruthann Cancer from critical care also discussed with the cardiology fellow Dr. Yancey Flemings.  And cardiology for the Cath Lab was Dr. Caprice Beaver.  Also had discussion with family that there is high probability for anoxic brain injury.  They opted to want a continue with current treatment.  Would reevaluate.  Cardiology recommended admission to critical care see if he remains stable.  Then may be head CT and may be Cath Lab tomorrow.  [facility] will be transporting patient.  Patient also started on Versed and fentanyl drip.  Norepinephrine still at 10.  Patient still on the ventilator.    Final Clinical Impression(s) / ED Diagnoses Final diagnoses:  Cardiac arrest [facility])    Rx / DC Orders ED Discharge Orders     None         Fredia Sorrow, MD 09/24/22 2009  Vanetta Mulders, MD 09/24/22 2010

## 2022-09-24 NOTE — ED Notes (Signed)
Pulse Check, No pulse, continued CPR

## 2022-09-24 NOTE — ED Notes (Signed)
Pt following commands to squeeze hand, wiggle toes, and push foot like gas pedal, able to perform all commands on right side

## 2022-09-24 NOTE — ED Notes (Signed)
Epi 1mg  Given

## 2022-09-24 NOTE — ED Notes (Signed)
Norepi Titrated up to 4mcg/min per provider verbal order

## 2022-09-24 NOTE — ED Notes (Signed)
Rhythm Vfib, no pulse Shock again at 200J Epi 1mg  Given, Continued CPR

## 2022-09-24 NOTE — ED Notes (Signed)
300mg  Amiodarone Given

## 2022-09-24 NOTE — ED Notes (Signed)
2 Gm Mag in 10 ml given over 5 min administered

## 2022-09-24 NOTE — ED Notes (Addendum)
Pt started having noticed seizure activity, Family now at bedside, other family escorted to xr family room at this time.

## 2022-09-24 NOTE — ED Notes (Signed)
Provider with family for discussion of POC, family requests continued CPR and airway management while attempting to contact more family members

## 2022-09-25 ENCOUNTER — Inpatient Hospital Stay (HOSPITAL_COMMUNITY): Payer: BC Managed Care – PPO

## 2022-09-25 DIAGNOSIS — I469 Cardiac arrest, cause unspecified: Secondary | ICD-10-CM

## 2022-09-25 LAB — RESP PANEL BY RT-PCR (RSV, FLU A&B, COVID)  RVPGX2
Influenza A by PCR: NEGATIVE
Influenza B by PCR: NEGATIVE
Resp Syncytial Virus by PCR: NEGATIVE
SARS Coronavirus 2 by RT PCR: NEGATIVE

## 2022-09-25 LAB — GLUCOSE, CAPILLARY
Glucose-Capillary: 575 mg/dL (ref 70–99)
Glucose-Capillary: 475 mg/dL — ABNORMAL HIGH (ref 70–99)
Glucose-Capillary: 378 mg/dL — ABNORMAL HIGH (ref 70–99)
Glucose-Capillary: 331 mg/dL — ABNORMAL HIGH (ref 70–99)
Glucose-Capillary: 299 mg/dL — ABNORMAL HIGH (ref 70–99)
Glucose-Capillary: 291 mg/dL — ABNORMAL HIGH (ref 70–99)
Glucose-Capillary: 323 mg/dL — ABNORMAL HIGH (ref 70–99)
Glucose-Capillary: 260 mg/dL — ABNORMAL HIGH (ref 70–99)
Glucose-Capillary: 223 mg/dL — ABNORMAL HIGH (ref 70–99)
Glucose-Capillary: 212 mg/dL — ABNORMAL HIGH (ref 70–99)
Glucose-Capillary: 98 mg/dL (ref 70–99)
Glucose-Capillary: 183 mg/dL — ABNORMAL HIGH (ref 70–99)
Glucose-Capillary: 177 mg/dL — ABNORMAL HIGH (ref 70–99)
Glucose-Capillary: 304 mg/dL — ABNORMAL HIGH (ref 70–99)
Glucose-Capillary: 228 mg/dL — ABNORMAL HIGH (ref 70–99)
Glucose-Capillary: 259 mg/dL — ABNORMAL HIGH (ref 70–99)
Glucose-Capillary: 273 mg/dL — ABNORMAL HIGH (ref 70–99)
Glucose-Capillary: 204 mg/dL — ABNORMAL HIGH (ref 70–99)
Glucose-Capillary: 196 mg/dL — ABNORMAL HIGH (ref 70–99)
Glucose-Capillary: 235 mg/dL — ABNORMAL HIGH (ref 70–99)
Glucose-Capillary: 171 mg/dL — ABNORMAL HIGH (ref 70–99)
Glucose-Capillary: 156 mg/dL — ABNORMAL HIGH (ref 70–99)

## 2022-09-25 LAB — CBC
HCT: 43.5 % (ref 39.0–52.0)
Hemoglobin: 15 g/dL (ref 13.0–17.0)
MCH: 31.2 pg (ref 26.0–34.0)
MCHC: 34.5 g/dL (ref 30.0–36.0)
MCV: 90.4 fL (ref 80.0–100.0)
Platelets: 202 10*3/uL (ref 150–400)
RBC: 4.81 MIL/uL (ref 4.22–5.81)
RDW: 12.7 % (ref 11.5–15.5)
WBC: 23.6 10*3/uL — ABNORMAL HIGH (ref 4.0–10.5)
nRBC: 0.2 % (ref 0.0–0.2)

## 2022-09-25 LAB — BASIC METABOLIC PANEL
Anion gap: 18 — ABNORMAL HIGH (ref 5–15)
Anion gap: 16 — ABNORMAL HIGH (ref 5–15)
Anion gap: 13 (ref 5–15)
Anion gap: 17 — ABNORMAL HIGH (ref 5–15)
BUN: 21 mg/dL — ABNORMAL HIGH (ref 6–20)
BUN: 22 mg/dL — ABNORMAL HIGH (ref 6–20)
BUN: 23 mg/dL — ABNORMAL HIGH (ref 6–20)
BUN: 23 mg/dL — ABNORMAL HIGH (ref 6–20)
CO2: 18 mmol/L — ABNORMAL LOW (ref 22–32)
CO2: 22 mmol/L (ref 22–32)
CO2: 24 mmol/L (ref 22–32)
CO2: 24 mmol/L (ref 22–32)
Calcium: 7.9 mg/dL — ABNORMAL LOW (ref 8.9–10.3)
Calcium: 8 mg/dL — ABNORMAL LOW (ref 8.9–10.3)
Calcium: 8.3 mg/dL — ABNORMAL LOW (ref 8.9–10.3)
Calcium: 8.3 mg/dL — ABNORMAL LOW (ref 8.9–10.3)
Chloride: 100 mmol/L (ref 98–111)
Chloride: 98 mmol/L (ref 98–111)
Chloride: 98 mmol/L (ref 98–111)
Chloride: 95 mmol/L — ABNORMAL LOW (ref 98–111)
Creatinine, Ser: 1.82 mg/dL — ABNORMAL HIGH (ref 0.61–1.24)
Creatinine, Ser: 1.54 mg/dL — ABNORMAL HIGH (ref 0.61–1.24)
Creatinine, Ser: 1.38 mg/dL — ABNORMAL HIGH (ref 0.61–1.24)
Creatinine, Ser: 1.39 mg/dL — ABNORMAL HIGH (ref 0.61–1.24)
GFR, Estimated: 43 mL/min — ABNORMAL LOW (ref >60)
GFR, Estimated: 52 mL/min — ABNORMAL LOW (ref >60)
GFR, Estimated: 60 mL/min — ABNORMAL LOW (ref >60)
GFR, Estimated: 59 mL/min — ABNORMAL LOW (ref >60)
Glucose, Bld: 472 mg/dL — ABNORMAL HIGH (ref 70–99)
Glucose, Bld: 303 mg/dL — ABNORMAL HIGH (ref 70–99)
Glucose, Bld: 216 mg/dL — ABNORMAL HIGH (ref 70–99)
Glucose, Bld: 263 mg/dL — ABNORMAL HIGH (ref 70–99)
Potassium: 4.2 mmol/L (ref 3.5–5.1)
Potassium: 3.3 mmol/L — ABNORMAL LOW (ref 3.5–5.1)
Potassium: 3.3 mmol/L — ABNORMAL LOW (ref 3.5–5.1)
Potassium: 3.3 mmol/L — ABNORMAL LOW (ref 3.5–5.1)
Sodium: 136 mmol/L (ref 135–145)
Sodium: 136 mmol/L (ref 135–145)
Sodium: 135 mmol/L (ref 135–145)
Sodium: 136 mmol/L (ref 135–145)

## 2022-09-25 LAB — ECHOCARDIOGRAM COMPLETE
Area-P 1/2: 2.09 cm2
Calc EF: 34.7 %
Est EF: 30
Height: 70 in
S' Lateral: 4.2 cm
Single Plane A2C EF: 34.7 %
Single Plane A4C EF: 35.1 %
Weight: 3276.92 oz

## 2022-09-25 LAB — MAGNESIUM
Magnesium: 2.9 mg/dL — ABNORMAL HIGH (ref 1.7–2.4)
Magnesium: 2.3 mg/dL (ref 1.7–2.4)
Magnesium: 2.2 mg/dL (ref 1.7–2.4)
Magnesium: 2.1 mg/dL (ref 1.7–2.4)
Magnesium: 1.9 mg/dL (ref 1.7–2.4)

## 2022-09-25 LAB — RAPID URINE DRUG SCREEN, HOSP PERFORMED
Amphetamines: NOT DETECTED
Barbiturates: NOT DETECTED
Benzodiazepines: POSITIVE — AB
Cocaine: NOT DETECTED
Opiates: NOT DETECTED
Tetrahydrocannabinol: NOT DETECTED

## 2022-09-25 LAB — COOXEMETRY PANEL
Carboxyhemoglobin: 2.7 % — ABNORMAL HIGH (ref 0.5–1.5)
Methemoglobin: <0.7 % (ref 0.0–1.5)
O2 Saturation: 80.6 %
Total hemoglobin: 15.1 g/dL (ref 12.0–16.0)

## 2022-09-25 LAB — HIV ANTIBODY (ROUTINE TESTING W REFLEX): HIV Screen 4th Generation wRfx: NONREACTIVE

## 2022-09-25 LAB — LACTIC ACID, PLASMA
Lactic Acid, Venous: >9 mmol/L (ref 0.5–1.9)
Lactic Acid, Venous: 7.5 mmol/L (ref 0.5–1.9)

## 2022-09-25 LAB — ETHANOL: Alcohol, Ethyl (B): <10 mg/dL (ref <10)

## 2022-09-25 LAB — PROCALCITONIN: Procalcitonin: 12.09 ng/mL

## 2022-09-25 LAB — HEPARIN LEVEL (UNFRACTIONATED)
Heparin Unfractionated: <0.1 IU/mL — ABNORMAL LOW (ref 0.30–0.70)
Heparin Unfractionated: <0.1 IU/mL — ABNORMAL LOW (ref 0.30–0.70)

## 2022-09-25 LAB — PHOSPHORUS: Phosphorus: 5.7 mg/dL — ABNORMAL HIGH (ref 2.5–4.6)

## 2022-09-25 LAB — TROPONIN I (HIGH SENSITIVITY): Troponin I (High Sensitivity): >24000 ng/L (ref <18)

## 2022-09-25 MED ORDER — POLYETHYLENE GLYCOL 3350 17 G PO PACK: 17.0000 g | PACK | Freq: Every day | ORAL | Status: DC | PRN

## 2022-09-25 MED ORDER — DOCUSATE SODIUM 50 MG/5ML PO LIQD
100.0000 mg | Freq: Two times a day (BID) | ORAL | Status: DC
  Administered 2022-09-27 – 2022-09-28 (×2): 100 mg via ORAL
  Filled 2022-09-25 (×2): qty 10

## 2022-09-25 MED ORDER — POTASSIUM CHLORIDE 20 MEQ PO PACK
20.0000 meq | PACK | Freq: Once | ORAL | Status: AC
  Administered 2022-09-25: 20 meq
  Filled 2022-09-25: qty 1

## 2022-09-25 MED ORDER — POTASSIUM CHLORIDE 10 MEQ/50ML IV SOLN
10.0000 meq | INTRAVENOUS | Status: AC
  Administered 2022-09-25 – 2022-09-26 (×6): 10 meq via INTRAVENOUS
  Filled 2022-09-25 (×6): qty 50

## 2022-09-25 MED ORDER — ATORVASTATIN CALCIUM 80 MG PO TABS: 80.0000 mg | ORAL_TABLET | Freq: Every day | ORAL | Status: DC

## 2022-09-25 MED ORDER — ORAL CARE MOUTH RINSE
15.0000 mL | OROMUCOSAL | Status: DC
  Administered 2022-09-25 (×7): 15 mL via OROMUCOSAL

## 2022-09-25 MED ORDER — SODIUM CHLORIDE 0.9 % IV SOLN: 2.0000 g | INTRAVENOUS | Status: DC

## 2022-09-25 MED ORDER — DOCUSATE SODIUM 50 MG/5ML PO LIQD: 100.0000 mg | Freq: Two times a day (BID) | ORAL | Status: DC | PRN

## 2022-09-25 MED ORDER — LIDOCAINE 5 % EX PTCH
1.0000 | MEDICATED_PATCH | CUTANEOUS | Status: DC
  Administered 2022-09-25 – 2022-09-27 (×3): 1 via TRANSDERMAL
  Filled 2022-09-25 (×3): qty 1

## 2022-09-25 MED ORDER — HEPARIN (PORCINE) 25000 UT/250ML-% IV SOLN
1650.0000 [IU]/h | INTRAVENOUS | Status: DC
  Administered 2022-09-25: 1200 [IU]/h via INTRAVENOUS
  Administered 2022-09-25: 1500 [IU]/h via INTRAVENOUS
  Administered 2022-09-26: 1650 [IU]/h via INTRAVENOUS
  Filled 2022-09-25 (×3): qty 250

## 2022-09-25 MED ORDER — PIPERACILLIN-TAZOBACTAM 3.375 G IVPB
3.3750 g | Freq: Three times a day (TID) | INTRAVENOUS | Status: DC
  Administered 2022-09-25 (×2): 3.375 g via INTRAVENOUS
  Filled 2022-09-25 (×2): qty 50

## 2022-09-25 MED ORDER — TRAMADOL HCL 50 MG PO TABS
50.0000 mg | ORAL_TABLET | Freq: Four times a day (QID) | ORAL | Status: DC | PRN
  Administered 2022-09-25: 50 mg via ORAL
  Filled 2022-09-25: qty 1

## 2022-09-25 MED ORDER — ATORVASTATIN CALCIUM 80 MG PO TABS
80.0000 mg | ORAL_TABLET | Freq: Every day | ORAL | Status: DC
  Administered 2022-09-26 – 2022-09-28 (×3): 80 mg via ORAL
  Filled 2022-09-25 (×3): qty 1

## 2022-09-25 MED ORDER — DEXTROSE 50 % IV SOLN: 0.0000 mL | INTRAVENOUS | Status: DC | PRN

## 2022-09-25 MED ORDER — ACETAMINOPHEN 325 MG PO TABS
650.0000 mg | ORAL_TABLET | Freq: Four times a day (QID) | ORAL | Status: DC
  Administered 2022-09-25 – 2022-09-28 (×12): 650 mg via ORAL
  Filled 2022-09-25 (×11): qty 2

## 2022-09-25 MED ORDER — ASPIRIN 81 MG PO CHEW
81.0000 mg | CHEWABLE_TABLET | Freq: Every day | ORAL | Status: DC
  Administered 2022-09-25: 81 mg
  Filled 2022-09-25: qty 1

## 2022-09-25 MED ORDER — ORAL CARE MOUTH RINSE: 15.0000 mL | OROMUCOSAL | Status: DC | PRN

## 2022-09-25 MED ORDER — PERFLUTREN LIPID MICROSPHERE
1.0000 mL | INTRAVENOUS | Status: DC | PRN
  Administered 2022-09-25: 8 mL via INTRAVENOUS

## 2022-09-25 MED ORDER — DEXTROSE IN LACTATED RINGERS 5 % IV SOLN: INTRAVENOUS | Status: DC

## 2022-09-25 MED ORDER — HEPARIN BOLUS VIA INFUSION
2000.0000 [IU] | Freq: Once | INTRAVENOUS | Status: AC
  Administered 2022-09-25: 2000 [IU] via INTRAVENOUS
  Filled 2022-09-25: qty 2000

## 2022-09-25 MED ORDER — CHLORHEXIDINE GLUCONATE 0.12 % MT SOLN
15.0000 mL | Freq: Once | OROMUCOSAL | Status: AC
  Administered 2022-09-25: 15 mL via OROMUCOSAL

## 2022-09-25 MED ORDER — ASPIRIN 81 MG PO CHEW
81.0000 mg | CHEWABLE_TABLET | Freq: Every day | ORAL | Status: DC
  Administered 2022-09-26 – 2022-09-28 (×3): 81 mg via ORAL
  Filled 2022-09-25 (×3): qty 1

## 2022-09-25 MED ORDER — INSULIN REGULAR(HUMAN) IN NACL 100-0.9 UT/100ML-% IV SOLN
INTRAVENOUS | Status: DC
  Administered 2022-09-25: 1 [IU]/h via INTRAVENOUS
  Administered 2022-09-25: 11.5 [IU]/h via INTRAVENOUS
  Filled 2022-09-25 (×3): qty 100

## 2022-09-25 MED ORDER — FAMOTIDINE 20 MG PO TABS
20.0000 mg | ORAL_TABLET | Freq: Two times a day (BID) | ORAL | Status: DC
  Administered 2022-09-25 – 2022-09-28 (×6): 20 mg via ORAL
  Filled 2022-09-25 (×6): qty 1

## 2022-09-25 MED ORDER — HEPARIN BOLUS VIA INFUSION
1000.0000 [IU] | Freq: Once | INTRAVENOUS | Status: AC
  Administered 2022-09-25: 1000 [IU] via INTRAVENOUS
  Filled 2022-09-25: qty 1000

## 2022-09-25 MED ORDER — BENZONATATE 100 MG PO CAPS
200.0000 mg | ORAL_CAPSULE | Freq: Three times a day (TID) | ORAL | Status: DC | PRN
  Administered 2022-09-25 – 2022-09-26 (×3): 200 mg via ORAL
  Filled 2022-09-25 (×3): qty 2

## 2022-09-25 NOTE — Progress Notes (Signed)
Pharmacy Antibiotic Note  Brian [name] is a 57 y.o. male admitted on 09/24/2022 with  possible aspiration pneumonia s/p prolonged cardiac arrest .  Pharmacy has been consulted for Zosyn dosing. WBC WNL. Last Scr 1.04, but may increase s/p arrest.   Plan: Zosyn 3.375G IV q8h to be infused over 4 hours   Height: 5\' 10"  (177.8 cm) Weight: 89.6 kg (197 lb 8.5 oz) IBW/kg (Calculated) : 73  Temp (24hrs), Avg:96.9 F (36.1 C), Min:89.5 F (31.9 C), Max:101.3 F (38.5 C)  Recent Labs  Lab 09/24/22 1822 09/24/22 2224  WBC 9.9  --   CREATININE 1.04  --   LATICACIDVEN  --  >9.0*    Estimated Creatinine Clearance: 88.2 mL/min (by C-G formula based on SCr of 1.04 mg/dL).    Allergies  Allergen Reactions   Macrobid [Nitrofurantoin] Other (See Comments)   Morphine Nausea And Vomiting and Other (See Comments)   Niacin Rash    Narda Bonds, PharmD, BCPS Clinical Pharmacist Phone: [phone]

## 2022-09-25 NOTE — Progress Notes (Signed)
ANTICOAGULATION CONSULT NOTE - Initial Consult  Pharmacy Consult for Heparin  Indication: chest pain/ACS  Allergies  Allergen Reactions   Macrobid [Nitrofurantoin] Other (See Comments)   Morphine Nausea And Vomiting and Other (See Comments)   Niacin Rash    Patient Measurements: Height: 5\' 10"  (177.8 cm) Weight: 89.6 kg (197 lb 8.5 oz) IBW/kg (Calculated) : 73  Vital Signs: Temp: 102.6 F (39.2 C) (01/28 0200) Temp Source: Bladder (01/28 0000) BP: 108/81 (01/28 0230) Pulse Rate: 129 (01/28 0230)  Labs: Recent Labs    09/24/22 1822 09/24/22 1823 09/24/22 1921 09/24/22 2323  HGB 15.9  --  13.6 14.3  HCT 44.4  --  40.0 42.0  PLT 160  --   --   --   CREATININE 1.04  --   --   --   TROPONINIHS  --  28*  --   --     Estimated Creatinine Clearance: 88.2 mL/min (by C-G formula based on SCr of 1.04 mg/dL).   Medical History: Past Medical History:  Diagnosis Date   GERD (gastroesophageal reflux disease)    hx. reflux.   Hyperlipidemia    tx. with meds(high triglycerides)   Recurrent UTI 06/30/2020   Renal cyst 08/17/2020   Ventral hernia     Assessment: 57 y/o M s/p prolonged cardiac arrest to start heparin for inferior STEMI. Managing medically for now and starting heparin. CBC ok. Last Scr 1.04.   Goal of Therapy:  Heparin level 0.3-0.7 units/ml Monitor platelets by anticoagulation protocol: Yes   Plan:  Start heparin drip at 1200 units/hr 1100 heparin level Daily CBC and heparin level Monitor for bleeding  Narda Bonds, PharmD, BCPS Clinical Pharmacist Phone: [phone]

## 2022-09-25 NOTE — Progress Notes (Signed)
[place] Progress Note Patient Name: Brian [name] DOB: [birth year] MRN: [id number]   Date of Service  09/25/2022  HPI/Events of Note  K 3.3 earlier this PM No repletion noted  eICU Interventions  Repleted per protocol     Intervention Category Minor Interventions: Electrolytes abnormality - evaluation and management  Yazhini Mcaulay Rodman Pickle 09/25/2022, 10:52 PM

## 2022-09-25 NOTE — Progress Notes (Addendum)
[place] Progress Note Patient Name: Brian [name] DOB: Oct 20, 1965 MRN: [id number]   Date of Service  09/25/2022  HPI/Events of Note  Notified of fever with Tmax 101.73F.  No obvious source of infection. MRSA swab negative.   CT head with no acute intracranial bleed.   eICU Interventions  Obtain blood cultures.  Apply cooling blanket.  Tylenol PRN.  Give empiric antibiotic for possible aspiration.  Start on heparin gtt as per cardiology recommendations as well. Pharmacy consult for heparin gtt.      Intervention Category Intermediate Interventions: Infection - evaluation and management  Elsie Lincoln 09/25/2022, 2:06 AM

## 2022-09-25 NOTE — Progress Notes (Addendum)
ANTICOAGULATION CONSULT NOTE - Initial Consult  Pharmacy Consult for Heparin  Indication: chest pain/ACS  Allergies  Allergen Reactions   Macrobid [Nitrofurantoin] Other (See Comments)   Morphine Nausea And Vomiting and Other (See Comments)   Niacin Rash    Patient Measurements: Height: 5\' 10"  (177.8 cm) Weight: 92.9 kg (204 lb 12.9 oz) IBW/kg (Calculated) : 73 Heparin DW: 91.7 kg  Vital Signs: Temp: 100 F (37.8 C) (01/28 1100) Temp Source: Bladder (01/28 1000) BP: 94/73 (01/28 1100) Pulse Rate: 90 (01/28 1100)  Labs: Recent Labs    09/24/22 1822 09/24/22 1823 09/24/22 1921 09/24/22 2323 09/25/22 0210 09/25/22 0631 09/25/22 1010 09/25/22 1100  HGB 15.9  --  13.6 14.3 15.0  --   --   --   HCT 44.4  --  40.0 42.0 43.5  --   --   --   PLT 160  --   --   --  202  --   --   --   HEPARINUNFRC  --   --   --   --   --   --   --  <0.10*  CREATININE 1.04  --   --   --  1.82* 1.54* 1.38*  --   TROPONINIHS  --  28*  --   --   --   --   --   --      Estimated Creatinine Clearance: 67.7 mL/min (A) (by C-G formula based on SCr of 1.38 mg/dL (H)).   Medical History: Past Medical History:  Diagnosis Date   GERD (gastroesophageal reflux disease)    hx. reflux.   Hyperlipidemia    tx. with meds(high triglycerides)   Recurrent UTI 06/30/2020   Renal cyst 08/17/2020   Ventral hernia     Assessment: 57 y/o M s/p prolonged cardiac arrest to start heparin for inferior STEMI. Managing medically for now with IV heparin. CBC ok. Last Scr up slightly to 1.38. Given long CPR time, will give smaller bolus. CBC stable. No bleeding noted.  Heparin level subtherapeutic at <0.1 on 1200 units/hr  Goal of Therapy:  Heparin level 0.3-0.7 units/ml Monitor platelets by anticoagulation protocol: Yes   Plan:  Heparin 1000  unit bolus x 1 Increase heparin drip to 1500 units/hr 2100 heparin level Daily CBC and heparin level Monitor for bleeding  Thank you for involving pharmacy in  this patient's care.  Reatha Harps, PharmD PGY2 Pharmacy Resident 09/25/2022 12:25 PM

## 2022-09-25 NOTE — Progress Notes (Signed)
Patient noticed blood in urine on Friday prior to admission x 1.  The blood was bright red and small per patient.  He noticed it in his underwear as well.  On Saturday, 09/24/22 he noticed pain in his left groin and felt "unwell" with nausea.

## 2022-09-25 NOTE — Progress Notes (Addendum)
NAME:  Brian [name], MRN:  [id number], DOB:  10/14/1965, LOS: 1 ADMISSION DATE:  09/24/2022, CONSULTATION DATE:  09/24/22 REFERRING MD:  [facility] EDP , CHIEF COMPLAINT: Post arrest x1hour   History of Present Illness:  57 yo male presented to [facility] with crushing chest pain, saw the waiting room full and left the ED via his wife picking him up. Per report they did not make it out of the hospital parking lot when he became unresponsive. Pt wife pulled back around and he was noted to be in vfib. He was shocked and broke vfib but reportedly did not have a pulse (suppose pea) then proceeded to code for ~1hour (see code documentation for complete details) 2/2 family wanting to do a video and have minister there. Pt achieved ROSC after 1 hour of acls. He then was noted to have seizure like activity. On 6 epi and 10 norepi, now on 10 norepi (all thru piv). Pt was noted to also have EKG changes consistent with inferior wall MI. Cardiology was contacted for cath but declined admission at this time, perhaps cath in the morning.   CCM was thus contacted for admission.   All history is obtained from chart and EDP as pt is unresponsive on vent.   Of note pt was seen 07/15/22 bu Dr Johnsie Cancel with cardiology. "57 y.o. last seen by cardiology in 2015. Has not been seen in 2 years History of GERD, HLD and  DM-2.  Father and uncle had premature CAD I took care of his dad who had CABG. Had normal ETT 2015 Calcium score was 16 involving the proximal and mid LAD This was 73 rd percentile for age/sex. Cardiac CT 02/25/20 Calcium score 252 , 90 th percentile prox LAD with 50-60% plaque Napkin ring morphology FFR negative in LAD 0.87."   He was ordered for a exercise stress test and follow up in one year. ETT was reportedly normal.   Pt arrived to our ICU on versed infusion and fentanyl, appears to be purposeful. We will stop the versed infusion and do neuro exam. On 57mch levo, adding vaso. Cvc to be placed.   Pertinent  Medical  History  GERD DM2 CAD Hyperlipidemia Renal stones   Significant Hospital Events: Including procedures, antibiotic start and stop dates in addition to other pertinent events   ~1hour ACLS at [facility] 09/24/22 Transferred to [facility], [place] neg,   Interim History / Subjective:   Weaning down NE, currently 10.  Vaso off Coox 80 Remains on insulin and heparin gtt  Off sedation, f/c but remained apneic on SBT Denies SOB/ pain Now weaning nearing extubation  Objective   Blood pressure 94/73, pulse 90, temperature 100 F (37.8 C), resp. rate (!) 21, height 5\' 10"  (1.778 m), weight 92.9 kg, SpO2 100 %.    Vent Mode: PRVC FiO2 (%):  [40 %-100 %] 40 % Set Rate:  [20 bmp-28 bmp] 28 bmp Vt Set:  [580 mL-800 mL] 580 mL PEEP:  [5 cmH20] 5 cmH20 Pressure Support:  [8 cmH20] 8 cmH20 Plateau Pressure:  [17 cmH20-19 cmH20] 18 cmH20   Intake/Output Summary (Last 24 hours) at 09/25/2022 1203 Last data filed at 09/25/2022 1109 Gross per 24 hour  Intake 2966.53 ml  Output 1950 ml  Net 1016.53 ml   Filed Weights   09/24/22 2200 09/25/22 0500  Weight: 89.6 kg 92.9 kg    Examination: General:  Critically ill adult male sitting upright in bed in NAD, wife at bedside, occasional shivers  HEENT: MM pink/moist, ETT/ OGT, pupils 4/reactive, anicteric  Neuro: Awake, f/c, MAE CV: rr, NSR PULM:  non labored on PSV 5/5, coarse, some mod thick than secretions GI: soft, bs+, NT, ND, foley Extremities: warm/dry, no LE edema  Skin: no rashes   Tmax 102.6 UOP 1.6 L /24hrs -655 ml  Labs > K 3.3, BUN/ sCr 23/ 1.38, Mag 2.1, WBC 9.9> 23.6, H/H 15/43   Resolved Hospital Problem list     Assessment & Plan:  IHCA s/p ROSC Vfib, torsades, PEA arrest Acute inferior STEMI:  CAD, with calcium score 252, FFR 0.84 in [place] cardiology input - cont to wean NE for MAP goal > 65 - coox reassuring  - heparin per pharmacy - LHC likely once stable from resp standpoint - ASA, statin  - 1/28 TTE>  LVEF 30%, G1DD, RWA>inferior, posterior, midinferoseptal, and basal inferoseptal hypokinectic, mod reduced RVSF - d/c cooling pads> shivering, prn buspar, neuro intact.  Avoid fever> tylenol prn  - change zosyn> ctx for now, if PCT neg consider monitoring off abx.   - follow cultures  - Monitor fever curve    Acute hypoxic resp failure req mechanical ventilation:  - weaning well, likely extubate - wean O2 for sat goal > 92% - aggressive pulm hygiene- IS, mobilize as able - abx as above     AKI Metabolic acidosis Hypokalemia  - sCr improving, good UOP - likely can get foley out - stop bicarb - K replete, keep Mag> 2  - Trend BMP / urinary output - Replace electrolytes as indicated - Avoid nephrotoxic agents, ensure adequate renal perfusion   Hyponatremia:  - resolved, monitor/ trend on labs    Acute metabolic encephalopathy:  Seizure like activity - CTH neg - Awake f/c - cont neuro protective measures and supportive care    T2DM - A1c 6.3> n ow 10.8 - DM coordinator referral  - insulin gtt  Best Practice (right click and "Reselect all SmartList Selections" daily)   Diet/type: NPO DVT prophylaxis: systemic heparin GI prophylaxis: H2B Lines: Central line Foley:  Yes, and it is still needed Code Status:  full code Last date of multidisciplinary goals of care discussion [tbd]  Wife updated at bedside  Labs   CBC: Recent Labs  Lab 09/24/22 1822 09/24/22 1921 09/24/22 2323 09/25/22 0210  WBC 9.9  --   --  23.6*  NEUTROABS 5.4  --   --   --   HGB 15.9 13.6 14.3 15.0  HCT 44.4 40.0 42.0 43.5  MCV 86.7  --   --  90.4  PLT 160  --   --  161    Basic Metabolic Panel: Recent Labs  Lab 09/24/22 1822 09/24/22 1921 09/24/22 2224 09/24/22 2323 09/25/22 0210 09/25/22 0631 09/25/22 1010  NA 129* 127*  --  135 136 136 135  K 4.1 4.7  --  5.7* 4.2 3.3* 3.3*  CL 96*  --   --   --  100 98 98  CO2 20*  --   --   --  18* 22 24  GLUCOSE 321*  --   --   --   472* 303* 216*  BUN 15  --   --   --  21* 22* 23*  CREATININE 1.04  --   --   --  1.82* 1.54* 1.38*  CALCIUM 8.8*  --   --   --  7.9* 8.0* 8.3*  MG  --   --  2.9*  --  2.3 2.2 2.1  PHOS  --   --  5.7*  --   --   --   --    GFR: Estimated Creatinine Clearance: 67.7 mL/min (A) (by C-G formula based on SCr of 1.38 mg/dL (H)). Recent Labs  Lab 09/24/22 1822 09/24/22 2224 09/25/22 0208 09/25/22 0210  WBC 9.9  --   --  23.6*  LATICACIDVEN  --  >9.0* 7.5*  --     Liver Function Tests: Recent Labs  Lab 09/24/22 1822  AST 39  ALT 33  ALKPHOS 99  BILITOT 0.5  PROT 7.5  ALBUMIN 4.2   No results for input(s): "LIPASE", "AMYLASE" in the last 168 hours. No results for input(s): "AMMONIA" in the last 168 hours.  ABG    Component Value Date/Time   PHART 7.255 (L) 09/24/2022 2323   PCO2ART 37.6 09/24/2022 2323   PO2ART 358 (H) 09/24/2022 2323   HCO3 16.4 (L) 09/24/2022 2323   TCO2 17 (L) 09/24/2022 2323   ACIDBASEDEF 10.0 (H) 09/24/2022 2323   O2SAT 80.6 09/25/2022 0114     Coagulation Profile: No results for input(s): "INR", "PROTIME" in the last 168 hours.  Cardiac Enzymes: No results for input(s): "CKTOTAL", "CKMB", "CKMBINDEX", "TROPONINI" in the last 168 hours.  HbA1C: Hgb A1c MFr Bld  Date/Time Value Ref Range Status  09/24/2022 10:24 PM 10.8 (H) 4.8 - 5.6 % Final    Comment:    (NOTE) Pre diabetes:          5.7%-6.4%  Diabetes:              >6.4%  Glycemic control for   <7.0% adults with diabetes     CBG: Recent Labs  Lab 09/25/22 0739 09/25/22 0843 09/25/22 0947 09/25/22 1053 09/25/22 1127  GLUCAP 323* 260* 223* 98 212*     Critical care time:  36 mins     [facility] 09/25/2022, 12:03 PM  See Amion for pager If no response to pager, please call PCCM consult pager After 7:00 pm call Elink

## 2022-09-25 NOTE — Progress Notes (Signed)
ANTICOAGULATION CONSULT NOTE   Pharmacy Consult for Heparin  Indication: chest pain/ACS  Allergies  Allergen Reactions   Macrobid [Nitrofurantoin] Other (See Comments)   Morphine Nausea And Vomiting and Other (See Comments)   Niacin Rash    Patient Measurements: Height: 5\' 10"  (177.8 cm) Weight: 92.9 kg (204 lb 12.9 oz) IBW/kg (Calculated) : 73 Heparin DW: 91.7 kg  Vital Signs: Temp: 99.5 F (37.5 C) (01/28 2130) Temp Source: Bladder (01/28 2000) BP: 88/67 (01/28 2130) Pulse Rate: 100 (01/28 2130)  Labs: Recent Labs    09/24/22 1822 09/24/22 1823 09/24/22 1921 09/24/22 2323 09/25/22 0210 09/25/22 0631 09/25/22 1010 09/25/22 1100 09/25/22 1502 09/25/22 1651 09/25/22 2018  HGB 15.9  --  13.6 14.3 15.0  --   --   --   --   --   --   HCT 44.4  --  40.0 42.0 43.5  --   --   --   --   --   --   PLT 160  --   --   --  202  --   --   --   --   --   --   HEPARINUNFRC  --   --   --   --   --   --   --  <0.10*  --   --  <0.10*  CREATININE 1.04  --   --   --  1.82* 1.54* 1.38*  --  1.39*  --   --   TROPONINIHS  --  28*  --   --   --   --   --   --   --  >24,000*  --      Estimated Creatinine Clearance: 67.2 mL/min (A) (by C-G formula based on SCr of 1.39 mg/dL (H)).   Medical History: Past Medical History:  Diagnosis Date   GERD (gastroesophageal reflux disease)    hx. reflux.   Hyperlipidemia    tx. with meds(high triglycerides)   Recurrent UTI 06/30/2020   Renal cyst 08/17/2020   Ventral hernia     Assessment: 57 y/o M s/p prolonged cardiac arrest to start heparin for inferior STEMI. Managing medically for now with IV heparin.   -Heparin level subtherapeutic at <0.1 on 1500 units/hr  Goal of Therapy:  Heparin level 0.3-0.7 units/ml Monitor platelets by anticoagulation protocol: Yes   Plan:  -heparin 2000 units x1, then increase to 1800 units/hr -Heparin level in 6 hours and daily wth CBC daily  Hildred Laser, PharmD Clinical Pharmacist **Pharmacist  phone directory can now be found on [email or web address] ([email or web address]).  Listed under [place].

## 2022-09-25 NOTE — Progress Notes (Signed)
RT attempted SBT but patient continuously falling into no pt effort on vent during trial. Patient following commands and appropriate at this time. RT will attempt again later. RN aware. RT will continue to monitor.

## 2022-09-25 NOTE — Progress Notes (Signed)
Fentanyl gtt wasted 130 cc from previous hospital with new line placement.  Versed gtt wasted 60 cc from previous hospital with new line placement.  Waste witnessed per protocol by Alto Denver, RN Charge nurse.

## 2022-09-25 NOTE — Progress Notes (Signed)
CRITICAL VALUE STICKER  CRITICAL VALUE:  Troponin  RECEIVER (on-site recipient of call):  Freda Jackson RN  DATE & TIME NOTIFIED: 09/25/22 1829  MD NOTIFIED: Dr. Lynetta Mare  TIME OF NOTIFICATION: [phone]

## 2022-09-25 NOTE — Progress Notes (Signed)
Echocardiogram 2D Echocardiogram has been performed.  Brian [name] 09/25/2022, 9:16 AM

## 2022-09-25 NOTE — Progress Notes (Signed)
Rounding Note    Patient Name: Brian [name] Date of Encounter: 09/25/2022  [facility] Cardiologist: Charlton Haws, MD   Subjective   Remains intubated and sedated  Inpatient Medications    Scheduled Meds:  acetaminophen  650 mg Oral Q4H   Or   acetaminophen (TYLENOL) oral liquid 160 mg/5 mL  650 mg Per Tube Q4H   Or   acetaminophen  650 mg Rectal Q4H   aspirin  81 mg Per Tube Daily   [START ON 09/26/2022] atorvastatin  80 mg Per Tube Daily   Chlorhexidine Gluconate Cloth  6 each Topical Daily   docusate  100 mg Per Tube BID   famotidine  20 mg Per Tube BID   mouth rinse  15 mL Mouth Rinse Q2H   polyethylene glycol  17 g Per Tube Daily   Continuous Infusions:  dexmedetomidine (PRECEDEX) IV infusion Stopped (09/25/22 1031)   dextrose 5% lactated ringers Stopped (09/25/22 0054)   fentaNYL infusion INTRAVENOUS Stopped (09/25/22 0951)   heparin 1,200 Units/hr (09/25/22 1109)   insulin Stopped (09/25/22 1055)   norepinephrine (LEVOPHED) Adult infusion 11 mcg/min (09/25/22 1109)   piperacillin-tazobactam (ZOSYN)  IV Stopped (09/25/22 0729)   sodium bicarbonate 150 mEq in sterile water 1,150 mL infusion 125 mL/hr at 09/25/22 1109   vasopressin Stopped (09/25/22 1107)   PRN Meds: [START ON 09/26/2022] acetaminophen **OR** [START ON 09/26/2022] acetaminophen (TYLENOL) oral liquid 160 mg/5 mL **OR** [START ON 09/26/2022] acetaminophen, busPIRone **OR** busPIRone, dextrose, docusate, fentaNYL, mouth rinse, polyethylene glycol   Vital Signs    Vitals:   09/25/22 1000 09/25/22 1023 09/25/22 1027 09/25/22 1100  BP: 112/82   94/73  Pulse: 83 82  90  Resp: (!) 28 18  (!) 21  Temp: 99.9 F (37.7 C)   100 F (37.8 C)  TempSrc: Bladder     SpO2: 100% 100% 100% 100%  Weight:      Height:        Intake/Output Summary (Last 24 hours) at 09/25/2022 1122 Last data filed at 09/25/2022 1109 Gross per 24 hour  Intake 2966.53 ml  Output 1950 ml  Net 1016.53 ml       09/25/2022    5:00 AM 09/24/2022   10:00 PM 07/15/2022    7:54 AM  Last 3 Weights  Weight (lbs) 204 lb 12.9 oz 197 lb 8.5 oz 201 lb 12.8 oz  Weight (kg) 92.9 kg 89.6 kg 91.536 kg      Telemetry    Sinus rhythm- Personally Reviewed  ECG    Sinus rhythm- Personally Reviewed  Physical Exam   GEN: No acute distress.  Intubated Neck: No JVD Cardiac: RRR, no murmurs, rubs, or gallops.  Respiratory: Clear to auscultation bilaterally. GI: Soft, nontender, non-distended  MS: No edema; No deformity. Neuro:  Nonfocal  Psych: Normal affect   Labs    High Sensitivity Troponin:   Recent Labs  Lab 09/24/22 1823  TROPONINIHS 28*     Chemistry Recent Labs  Lab 09/24/22 1822 09/24/22 1921 09/24/22 2224 09/24/22 2323 09/25/22 0210 09/25/22 0631  NA 129*   < >  --  135 136 136  K 4.1   < >  --  5.7* 4.2 3.3*  CL 96*  --   --   --  100 98  CO2 20*  --   --   --  18* 22  GLUCOSE 321*  --   --   --  472* 303*  BUN 15  --   --   --  21* 22*  CREATININE 1.04  --   --   --  1.82* 1.54*  CALCIUM 8.8*  --   --   --  7.9* 8.0*  MG  --   --  2.9*  --  2.3 2.2  PROT 7.5  --   --   --   --   --   ALBUMIN 4.2  --   --   --   --   --   AST 39  --   --   --   --   --   ALT 33  --   --   --   --   --   ALKPHOS 99  --   --   --   --   --   BILITOT 0.5  --   --   --   --   --   GFRNONAA >60  --   --   --  43* 52*  ANIONGAP 13  --   --   --  18* 16*   < > = values in this interval not displayed.    Lipids No results for input(s): "CHOL", "TRIG", "HDL", "LABVLDL", "LDLCALC", "CHOLHDL" in the last 168 hours.  Hematology Recent Labs  Lab 09/24/22 1822 09/24/22 1921 09/24/22 2323 09/25/22 0210  WBC 9.9  --   --  23.6*  RBC 5.12  --   --  4.81  HGB 15.9 13.6 14.3 15.0  HCT 44.4 40.0 42.0 43.5  MCV 86.7  --   --  90.4  MCH 31.1  --   --  31.2  MCHC 35.8  --   --  34.5  RDW 12.4  --   --  12.7  PLT 160  --   --  202   Thyroid No results for input(s): "TSH", "FREET4" in the last 168  hours.  BNPNo results for input(s): "BNP", "PROBNP" in the last 168 hours.  DDimer No results for input(s): "DDIMER" in the last 168 hours.   Radiology    ECHOCARDIOGRAM COMPLETE  Result Date: 09/25/2022    ECHOCARDIOGRAM REPORT   Patient Name:   Brian [name] Date of Exam: 09/25/2022 Medical Rec #:  [id number]        Height:       70.0 in Accession #:    [id number]       Weight:       204.8 lb Date of Birth:  01/07/1966         BSA:          2.108 m Patient Age:    57 years         BP:           97/77 mmHg Patient Gender: M                HR:           88 bpm. Exam Location:  Inpatient Procedure: 2D Echo, Cardiac Doppler, Color Doppler and Intracardiac            Opacification Agent STAT ECHO Indications:    Cardiac arrest I46.9  History:        Patient has no prior history of Echocardiogram examinations.                 Risk Factors:GERD and Dyslipidemia.  Sonographer:    Bernadene Person RDCS Referring Phys: [id number] [place]  1. Left ventricular ejection fraction, by estimation, is 30%. The left  ventricle has moderately decreased function. The left ventricle demonstrates regional wall motion abnormalities (see scoring diagram/findings for description). Left ventricular diastolic parameters are consistent with Grade I diastolic dysfunction (impaired relaxation).  2. Right ventricular systolic function is moderately reduced. The right ventricular size is mildly enlarged.  3. The mitral valve is normal in structure. Mild mitral valve regurgitation.  4. The aortic valve is tricuspid. Aortic valve regurgitation is not visualized. Aortic valve sclerosis is present, with no evidence of aortic valve stenosis. FINDINGS  Left Ventricle: Left ventricular ejection fraction, by estimation, is 30%. The left ventricle has moderately decreased function. The left ventricle demonstrates regional wall motion abnormalities. Definity contrast agent was given IV to delineate the left ventricular endocardial  borders. The left ventricular internal cavity size was normal in size. There is no left ventricular hypertrophy. Left ventricular diastolic parameters are consistent with Grade I diastolic dysfunction (impaired relaxation). Normal left ventricular filling pressure.  LV Wall Scoring: The entire inferior wall, posterior wall, mid inferoseptal segment, and basal inferoseptal segment are hypokinetic. Right Ventricle: The right ventricular size is mildly enlarged. No increase in right ventricular wall thickness. Right ventricular systolic function is moderately reduced. The tricuspid regurgitant velocity is 1.92 m/s, and with an assumed right atrial pressure of 15 mmHg, the estimated right ventricular systolic pressure is 29.7 mmHg. Left Atrium: Left atrial size was normal in size. Right Atrium: Right atrial size was normal in size. Pericardium: There is no evidence of pericardial effusion. Mitral Valve: The mitral valve is normal in structure. Mild mitral valve regurgitation. Tricuspid Valve: The tricuspid valve is normal in structure. Tricuspid valve regurgitation is trivial. Aortic Valve: The aortic valve is tricuspid. Aortic valve regurgitation is not visualized. Aortic valve sclerosis is present, with no evidence of aortic valve stenosis. Pulmonic Valve: The pulmonic valve was grossly normal. Pulmonic valve regurgitation is not visualized. Aorta: The aortic root and ascending aorta are structurally normal, with no evidence of dilitation. Venous: IVC assessment for right atrial pressure unable to be performed due to mechanical ventilation. IAS/Shunts: No atrial level shunt detected by color flow Doppler.  LEFT VENTRICLE PLAX 2D LVIDd:         4.80 cm     Diastology LVIDs:         4.20 cm     LV e' medial:    4.62 cm/s LV PW:         1.00 cm     LV E/e' medial:  7.9 LV IVS:        1.10 cm     LV e' lateral:   5.68 cm/s LVOT diam:     2.10 cm     LV E/e' lateral: 6.4 LV SV:         35 LV SV Index:   16 LVOT Area:      3.46 cm  LV Volumes (MOD) LV vol d, MOD A2C: 77.3 ml LV vol d, MOD A4C: 92.9 ml LV vol s, MOD A2C: 50.5 ml LV vol s, MOD A4C: 60.3 ml LV SV MOD A2C:     26.8 ml LV SV MOD A4C:     92.9 ml LV SV MOD BP:      29.9 ml RIGHT VENTRICLE RV S prime:     5.56 cm/s TAPSE (M-mode): 0.9 cm LEFT ATRIUM             Index        RIGHT ATRIUM  Index LA diam:        3.40 cm 1.61 cm/m   RA Area:     18.30 cm LA Vol (A2C):   35.4 ml 16.79 ml/m  RA Volume:   50.10 ml  23.76 ml/m LA Vol (A4C):   31.9 ml 15.13 ml/m LA Biplane Vol: 36.8 ml 17.45 ml/m  AORTIC VALVE LVOT Vmax:   67.70 cm/s LVOT Vmean:  48.550 cm/s LVOT VTI:    0.100 m  AORTA Ao Root diam: 3.00 cm Ao Asc diam:  3.50 cm MITRAL VALVE               TRICUSPID VALVE MV Area (PHT): 2.09 cm    TR Peak grad:   14.7 mmHg MV Decel Time: 363 msec    TR Vmax:        192.00 cm/s MV E velocity: 36.40 cm/s MV A velocity: 41.00 cm/s  SHUNTS MV E/A ratio:  0.89        Systemic VTI:  0.10 m                            Systemic Diam: 2.10 cm Rachelle Hora Croitoru MD Electronically signed by Thurmon Fair MD Signature Date/Time: 09/25/2022/10:44:57 AM    Final    CT HEAD WO CONTRAST ( )  Result Date: 09/25/2022 CLINICAL DATA:  Initial evaluation for altered mental status. EXAM: CT HEAD WITHOUT CONTRAST TECHNIQUE: Contiguous axial images were obtained from the base of the skull through the vertex without intravenous contrast. RADIATION DOSE REDUCTION: This exam was performed according to the departmental dose-optimization program which includes automated exposure control, adjustment of the mA and/or kV according to patient size and/or use of iterative reconstruction technique. COMPARISON:  None Available. FINDINGS: Brain: Cerebral volume within normal limits. No acute intracranial hemorrhage. No acute large vessel territory infarct. No mass lesion or midline shift. No hydrocephalus or extra-axial fluid collection. Vascular: No convincing abnormal hyperdense vessel. Skull: Scalp  soft tissues and calvarium demonstrate no acute finding. Sinuses/Orbits: Globes orbital soft tissues within normal limits. Trace layering secretions noted within the sphenoid sinuses. Mastoid air cells are clear. Patient is intubated. Other: None. IMPRESSION: Negative head CT. No acute intracranial abnormality identified. Electronically Signed   By: Rise Mu M.D.   On: 09/25/2022 01:24   DG CHEST PORT 1 VIEW  Result Date: 09/24/2022 CLINICAL DATA:  Central line and endotracheal tube placement. EXAM: PORTABLE CHEST 1 VIEW COMPARISON:  Radiograph earlier today. FINDINGS: New left internal jugular central venous catheter tip overlies the upper SVC. No pneumothorax. Endotracheal tube tip is 5.8 cm from the carina. Enteric tube in place with tip below the diaphragm not included in this chest field of view. Lung volumes are low. Stable heart size and mediastinal contours. No developing airspace disease. IMPRESSION: 1. New left internal jugular central venous catheter tip overlies the upper SVC. No pneumothorax. 2. Endotracheal tube tip 5.8 cm from the carina. Enteric tube in place. 3. Low lung volumes. Electronically Signed   By: Narda Rutherford M.D.   On: 09/24/2022 23:49   DG Abd 1 View  Result Date: 09/24/2022 CLINICAL DATA:  Orogastric tube placement. EXAM: ABDOMEN - 1 VIEW COMPARISON:  None Available. FINDINGS: Tip and side port of the enteric tube below the diaphragm in the stomach. Nonobstructive upper abdominal bowel gas pattern. Moderate volume of stool in the colon. IMPRESSION: Tip and side port of the enteric tube below the diaphragm in the stomach. Electronically  Signed   By: Keith Rake M.D.   On: 09/24/2022 23:47   DG Chest Portable 1 View  Result Date: 09/24/2022 CLINICAL DATA:  Cardiac arrest. EXAM: PORTABLE CHEST 1 VIEW COMPARISON:  None Available. FINDINGS: Endotracheal tube tip is approximately 3.5 cm from the carina. Enteric tube is below the diaphragm not included in the  field of view. Lung volumes are low. Heart size upper normal. Grossly normal mediastinal contours for technique. No confluent airspace disease, large pleural effusion or visible pneumothorax. Right humeral head arthroplasty. Question postsurgical change of the left acromioclavicular joint. No grossly displaced rib fractures are seen. IMPRESSION: 1. Endotracheal tube tip approximately 3.5 cm from the carina. Enteric tube is below the diaphragm not included in the field of view. 2. Low lung volumes. Electronically Signed   By: Keith Rake M.D.   On: 09/24/2022 20:26    Cardiac Studies   TTE   1. Left ventricular ejection fraction, by estimation, is 30%. The left  ventricle has moderately decreased function. The left ventricle  demonstrates regional wall motion abnormalities (see scoring  diagram/findings for description). Left ventricular  diastolic parameters are consistent with Grade I diastolic dysfunction  (impaired relaxation).   2. Right ventricular systolic function is moderately reduced. The right  ventricular size is mildly enlarged.   3. The mitral valve is normal in structure. Mild mitral valve  regurgitation.   4. The aortic valve is tricuspid. Aortic valve regurgitation is not  visualized. Aortic valve sclerosis is present, with no evidence of aortic  valve stenosis.   Patient Profile     57 y.o. male history of hypertension, type 2 diabetes presented to hospital with out of hospital cardiac arrest and inferior STEMI.  Assessment & Plan    1.  Ventricular fibrillation arrest: Likely due to inferior STEMI.  Remains in normal rhythm.  Continue to monitor.  If he does have further arrhythmias, Angelik Walls likely need amiodarone.  2.  Inferior STEMI: Significant inferior ST elevations.  No left heart catheterization was performed as the patient had a prolonged arrest and ST elevations were improving.  He Salam Chesterfield need left heart catheterization once extubated.  3.  Acute systolic heart  failure: Ejection fraction 30% with hypokinesis of multiple areas.  Likely due to both inferior STEMI and stenting from prolonged arrest.  Has good urine output currently.  If urine output starts to go down, Briannie Gutierrez need to be started on Levophed.  Haydyn Liddell continue with supportive care for now, but Dajaun Goldring need optimal medical therapy for heart failure at discharge.     For questions or updates, please contact [place] Please consult [email or web address] for contact info under        Signed, Davit Vassar Meredith Leeds, MD  09/25/2022, 11:22 AM

## 2022-09-25 NOTE — Procedures (Signed)
Extubation Procedure Note  Patient Details:   Name: KRISTINE TILEY DOB: 1966-08-27 MRN: [id number]   Airway Documentation:    Vent end date: 09/25/22 Vent end time: 1512   Evaluation  O2 sats: stable throughout Complications: No apparent complications Patient did tolerate procedure well. Bilateral Breath Sounds: Diminished, Clear   Yes  RT extubated patient to 3L [place] per MD order with RN at bedside. Positive cuff leak noted. Patient tolerated well, no distress/stridor noted at this time.   Fabiola Backer 09/25/2022, 3:37 PM

## 2022-09-25 NOTE — Progress Notes (Signed)
Pt transported to CT and back without complications. 

## 2022-09-26 ENCOUNTER — Other Ambulatory Visit (HOSPITAL_COMMUNITY): Payer: Self-pay

## 2022-09-26 ENCOUNTER — Encounter (HOSPITAL_COMMUNITY): Payer: Self-pay | Admitting: Internal Medicine

## 2022-09-26 ENCOUNTER — Encounter (HOSPITAL_COMMUNITY)
Admission: EM | Disposition: A | Discharge status: Home / Self Care | Payer: Self-pay | Source: Home / Self Care | Attending: Internal Medicine

## 2022-09-26 DIAGNOSIS — E7800 Pure hypercholesterolemia, unspecified: Secondary | ICD-10-CM

## 2022-09-26 DIAGNOSIS — I2111 ST elevation (STEMI) myocardial infarction involving right coronary artery: Secondary | ICD-10-CM | POA: Diagnosis not present

## 2022-09-26 DIAGNOSIS — I2510 Atherosclerotic heart disease of native coronary artery without angina pectoris: Secondary | ICD-10-CM

## 2022-09-26 DIAGNOSIS — I469 Cardiac arrest, cause unspecified: Secondary | ICD-10-CM | POA: Diagnosis not present

## 2022-09-26 DIAGNOSIS — E119 Type 2 diabetes mellitus without complications: Secondary | ICD-10-CM

## 2022-09-26 HISTORY — PX: LEFT HEART CATH AND CORONARY ANGIOGRAPHY: CATH118249

## 2022-09-26 LAB — LIPID PANEL
Cholesterol: 107 mg/dL (ref 0–200)
HDL: 18 mg/dL — ABNORMAL LOW (ref >40)
LDL Cholesterol: UNDETERMINED mg/dL (ref 0–99)
Total CHOL/HDL Ratio: 5.9 RATIO
Triglycerides: 428 mg/dL — ABNORMAL HIGH (ref <150)
VLDL: UNDETERMINED mg/dL (ref 0–40)

## 2022-09-26 LAB — BASIC METABOLIC PANEL
Anion gap: 9 (ref 5–15)
BUN: 12 mg/dL (ref 6–20)
CO2: 29 mmol/L (ref 22–32)
Calcium: 8.1 mg/dL — ABNORMAL LOW (ref 8.9–10.3)
Chloride: 100 mmol/L (ref 98–111)
Creatinine, Ser: 1.16 mg/dL (ref 0.61–1.24)
GFR, Estimated: >60 mL/min (ref >60)
Glucose, Bld: 141 mg/dL — ABNORMAL HIGH (ref 70–99)
Potassium: 3.7 mmol/L (ref 3.5–5.1)
Sodium: 138 mmol/L (ref 135–145)

## 2022-09-26 LAB — URINE CULTURE: Culture: NO GROWTH

## 2022-09-26 LAB — GLUCOSE, CAPILLARY
Glucose-Capillary: 148 mg/dL — ABNORMAL HIGH (ref 70–99)
Glucose-Capillary: 151 mg/dL — ABNORMAL HIGH (ref 70–99)
Glucose-Capillary: 140 mg/dL — ABNORMAL HIGH (ref 70–99)
Glucose-Capillary: 147 mg/dL — ABNORMAL HIGH (ref 70–99)
Glucose-Capillary: 135 mg/dL — ABNORMAL HIGH (ref 70–99)
Glucose-Capillary: 151 mg/dL — ABNORMAL HIGH (ref 70–99)
Glucose-Capillary: 126 mg/dL — ABNORMAL HIGH (ref 70–99)
Glucose-Capillary: 131 mg/dL — ABNORMAL HIGH (ref 70–99)
Glucose-Capillary: 128 mg/dL — ABNORMAL HIGH (ref 70–99)
Glucose-Capillary: 127 mg/dL — ABNORMAL HIGH (ref 70–99)
Glucose-Capillary: 124 mg/dL — ABNORMAL HIGH (ref 70–99)

## 2022-09-26 LAB — CBC
HCT: 34.3 % — ABNORMAL LOW (ref 39.0–52.0)
Hemoglobin: 11.8 g/dL — ABNORMAL LOW (ref 13.0–17.0)
MCH: 30.8 pg (ref 26.0–34.0)
MCHC: 34.4 g/dL (ref 30.0–36.0)
MCV: 89.6 fL (ref 80.0–100.0)
Platelets: 142 10*3/uL — ABNORMAL LOW (ref 150–400)
RBC: 3.83 MIL/uL — ABNORMAL LOW (ref 4.22–5.81)
RDW: 12.9 % (ref 11.5–15.5)
WBC: 11.2 10*3/uL — ABNORMAL HIGH (ref 4.0–10.5)
nRBC: 0 % (ref 0.0–0.2)

## 2022-09-26 LAB — HEPARIN LEVEL (UNFRACTIONATED): Heparin Unfractionated: 0.74 IU/mL — ABNORMAL HIGH (ref 0.30–0.70)

## 2022-09-26 LAB — LDL CHOLESTEROL, DIRECT: Direct LDL: 21.9 mg/dL (ref 0–99)

## 2022-09-26 SURGERY — LEFT HEART CATH AND CORONARY ANGIOGRAPHY
Anesthesia: LOCAL

## 2022-09-26 MED ORDER — FENTANYL CITRATE (PF) 100 MCG/2ML IJ SOLN
INTRAMUSCULAR | Status: AC
  Filled 2022-09-26: qty 2

## 2022-09-26 MED ORDER — ORAL CARE MOUTH RINSE: 15.0000 mL | OROMUCOSAL | Status: DC | PRN

## 2022-09-26 MED ORDER — CLOPIDOGREL BISULFATE 300 MG PO TABS
300.0000 mg | ORAL_TABLET | Freq: Once | ORAL | Status: AC
  Administered 2022-09-26: 300 mg via ORAL
  Filled 2022-09-26: qty 1

## 2022-09-26 MED ORDER — VERAPAMIL HCL 2.5 MG/ML IV SOLN
INTRAVENOUS | Status: DC | PRN
  Administered 2022-09-26: 10 mL via INTRA_ARTERIAL

## 2022-09-26 MED ORDER — INSULIN DETEMIR 100 UNIT/ML ~~LOC~~ SOLN
15.0000 [IU] | Freq: Two times a day (BID) | SUBCUTANEOUS | Status: DC
  Administered 2022-09-26 – 2022-09-28 (×5): 15 [IU] via SUBCUTANEOUS
  Filled 2022-09-26 (×7): qty 0.15

## 2022-09-26 MED ORDER — IOHEXOL 350 MG/ML SOLN
INTRAVENOUS | Status: DC | PRN
  Administered 2022-09-26: 35 mL via INTRA_ARTERIAL

## 2022-09-26 MED ORDER — SODIUM CHLORIDE 0.9 % IV SOLN
250.0000 mL | INTRAVENOUS | Status: DC | PRN
  Administered 2022-09-26: 250 mL via INTRAVENOUS

## 2022-09-26 MED ORDER — SODIUM CHLORIDE 0.9 % IV SOLN: INTRAVENOUS | Status: DC

## 2022-09-26 MED ORDER — HYDRALAZINE HCL 20 MG/ML IJ SOLN: 10.0000 mg | INTRAMUSCULAR | Status: AC | PRN

## 2022-09-26 MED ORDER — SODIUM CHLORIDE 0.9% FLUSH
3.0000 mL | Freq: Two times a day (BID) | INTRAVENOUS | Status: DC
  Administered 2022-09-26: 3 mL via INTRAVENOUS

## 2022-09-26 MED ORDER — OXYCODONE HCL 5 MG PO TABS
5.0000 mg | ORAL_TABLET | Freq: Four times a day (QID) | ORAL | Status: DC | PRN
  Administered 2022-09-26 – 2022-09-27 (×3): 5 mg via ORAL
  Filled 2022-09-26 (×3): qty 1

## 2022-09-26 MED ORDER — SODIUM CHLORIDE 0.9 % IV SOLN: 250.0000 mL | INTRAVENOUS | Status: DC | PRN

## 2022-09-26 MED ORDER — POTASSIUM CHLORIDE CRYS ER 20 MEQ PO TBCR
40.0000 meq | EXTENDED_RELEASE_TABLET | Freq: Two times a day (BID) | ORAL | Status: AC
  Administered 2022-09-26 (×2): 40 meq via ORAL
  Filled 2022-09-26 (×2): qty 2

## 2022-09-26 MED ORDER — KETOROLAC TROMETHAMINE 30 MG/ML IJ SOLN: 30.0000 mg | Freq: Three times a day (TID) | INTRAMUSCULAR | Status: DC | PRN

## 2022-09-26 MED ORDER — MIDAZOLAM HCL 2 MG/2ML IJ SOLN
INTRAMUSCULAR | Status: DC | PRN
  Administered 2022-09-26: 1 mg via INTRAVENOUS

## 2022-09-26 MED ORDER — FENTANYL CITRATE (PF) 100 MCG/2ML IJ SOLN
INTRAMUSCULAR | Status: DC | PRN
  Administered 2022-09-26: 25 ug via INTRAVENOUS

## 2022-09-26 MED ORDER — SODIUM CHLORIDE 0.9% FLUSH
3.0000 mL | Freq: Two times a day (BID) | INTRAVENOUS | Status: DC
  Administered 2022-09-26 – 2022-09-27 (×3): 3 mL via INTRAVENOUS

## 2022-09-26 MED ORDER — LIDOCAINE HCL (PF) 1 % IJ SOLN
INTRAMUSCULAR | Status: DC | PRN
  Administered 2022-09-26: 2 mL via INTRADERMAL

## 2022-09-26 MED ORDER — ROSUVASTATIN CALCIUM 20 MG PO TABS: 40.0000 mg | ORAL_TABLET | Freq: Every day | ORAL | Status: DC

## 2022-09-26 MED ORDER — MIDAZOLAM HCL 2 MG/2ML IJ SOLN
INTRAMUSCULAR | Status: AC
  Filled 2022-09-26: qty 2

## 2022-09-26 MED ORDER — LIDOCAINE HCL (PF) 1 % IJ SOLN
INTRAMUSCULAR | Status: AC
  Filled 2022-09-26: qty 30

## 2022-09-26 MED ORDER — ENOXAPARIN SODIUM 40 MG/0.4ML IJ SOSY
40.0000 mg | PREFILLED_SYRINGE | INTRAMUSCULAR | Status: DC
  Administered 2022-09-27 – 2022-09-28 (×2): 40 mg via SUBCUTANEOUS
  Filled 2022-09-26 (×2): qty 0.4

## 2022-09-26 MED ORDER — INSULIN ASPART 100 UNIT/ML IJ SOLN
2.0000 [IU] | INTRAMUSCULAR | Status: DC
  Administered 2022-09-26 (×3): 2 [IU] via SUBCUTANEOUS
  Administered 2022-09-27: 4 [IU] via SUBCUTANEOUS
  Administered 2022-09-27 – 2022-09-28 (×2): 2 [IU] via SUBCUTANEOUS

## 2022-09-26 MED ORDER — VERAPAMIL HCL 2.5 MG/ML IV SOLN
INTRAVENOUS | Status: AC
  Filled 2022-09-26: qty 2

## 2022-09-26 MED ORDER — HEPARIN (PORCINE) IN NACL 1000-0.9 UT/500ML-% IV SOLN
INTRAVENOUS | Status: DC | PRN
  Administered 2022-09-26 (×2): 500 mL

## 2022-09-26 MED ORDER — HEPARIN SODIUM (PORCINE) 1000 UNIT/ML IJ SOLN
INTRAMUSCULAR | Status: AC
  Filled 2022-09-26: qty 10

## 2022-09-26 MED ORDER — SODIUM CHLORIDE 0.9% FLUSH: 3.0000 mL | INTRAVENOUS | Status: DC | PRN

## 2022-09-26 MED ORDER — CLOPIDOGREL BISULFATE 75 MG PO TABS
75.0000 mg | ORAL_TABLET | Freq: Every day | ORAL | Status: DC
  Administered 2022-09-27 – 2022-09-28 (×2): 75 mg via ORAL
  Filled 2022-09-26 (×2): qty 1

## 2022-09-26 MED ORDER — KETOROLAC TROMETHAMINE 30 MG/ML IJ SOLN
30.0000 mg | Freq: Three times a day (TID) | INTRAMUSCULAR | Status: DC
  Administered 2022-09-26: 30 mg via INTRAVENOUS
  Filled 2022-09-26: qty 1

## 2022-09-26 MED ORDER — HEPARIN (PORCINE) IN NACL 1000-0.9 UT/500ML-% IV SOLN
INTRAVENOUS | Status: AC
  Filled 2022-09-26: qty 1000

## 2022-09-26 SURGICAL SUPPLY — 10 items
CATH 5FR JL3.5 JR4 ANG PIG MP (CATHETERS) IMPLANT
DEVICE RAD COMP TR BAND LRG (VASCULAR PRODUCTS) IMPLANT
ELECT DEFIB PAD ADLT CADENCE (PAD) IMPLANT
GLIDESHEATH SLEND SS 6F .021 (SHEATH) IMPLANT
GUIDEWIRE INQWIRE 1.5J.035X260 (WIRE) IMPLANT
INQWIRE 1.5J .035X260CM (WIRE) ×1
KIT HEART LEFT (KITS) ×1 IMPLANT
PACK CARDIAC CATHETERIZATION (CUSTOM PROCEDURE TRAY) ×1 IMPLANT
TRANSDUCER W/STOPCOCK (MISCELLANEOUS) ×1 IMPLANT
TUBING CIL FLEX 10 FLL-RA (TUBING) ×1 IMPLANT

## 2022-09-26 NOTE — Progress Notes (Signed)
Heart Failure Nurse Navigator Progress Note  PCP: Lawerance Cruel, MD PCP-Cardiologist: Johnsie Cancel Admission Diagnosis: Cardiac arrest Admitted from: Home to [facility] to [facility]  Presentation:   Bryna Colander presented with crushing chest pain, saw waiting room full, left ED, wife picked him up before they left parking lot, patient became unresponsive, back to ED found to be in V-fib, shocked, and broke vfib, still no pulse, proceeded to code for 1 hour, achieved ROSC, then noted to have seizure like activity. Placed on EPI and norepi, admitted to ICU. Extubated on 09/25/22, pressors weaned off. Heart cath on 09/26/22: single vessel CAD, Severe single-vessel coronary artery disease; culprit lesion for the patient's inferior STEMI and cardiac arrest is likely 100% occlusion of mid RCA.  There is moderate (50-60%) disease involving the proximal LAD and mild (20%) stenosis of the proximal LCx. Mildly elevated left ventricular filling pressure (LVEDP 20 mmHg). Plan aggressive GDMT, Patient and wife were educated on the sign and symptoms of heart failure, daily weights, when to call his doctor or go to the ED. Diet/ fluid restrictions, taking all medications as prescribed and attending all medical appointments. Patient and wife interacted during education and asked a number of questions, both verbalized their understanding. A hospital HF TOC appointment was scheduled for 10/12/2022 @ 9 am.      ECHO/ LVEF: 30% HFrEF  Clinical Course:  Past Medical History:  Diagnosis Date   GERD (gastroesophageal reflux disease)    hx. reflux.   Hyperlipidemia    tx. with meds(high triglycerides)   Recurrent UTI 06/30/2020   Renal cyst 08/17/2020   Ventral hernia      Social History   Socioeconomic History   Marital status: Married    Spouse name: Not on file   Number of children: Not on file   Years of education: Not on file   Highest education level: Not on file  Occupational History   Not on file  Tobacco  Use   Smoking status: Never   Smokeless tobacco: Never  Substance and Sexual Activity   Alcohol use: No   Drug use: No   Sexual activity: Not Currently  Other Topics Concern   Not on file  Social History Narrative   Not on file   Social Determinants of Health   Financial Resource Strain: Not on file  Food Insecurity: Not on file  Transportation Needs: Not on file  Physical Activity: Not on file  Stress: Not on file  Social Connections: Not on file   Education Assessment and Provision:  Detailed education and instructions provided on heart failure disease management including the following:  Signs and symptoms of Heart Failure When to call the physician Importance of daily weights Low sodium diet Fluid restriction Medication management Anticipated future follow-up appointments  Patient education given on each of the above topics.  Patient acknowledges understanding via teach back method and acceptance of all instructions.  Education Materials:  "Living Better With Heart Failure" Booklet, HF zone tool, & Daily Weight Tracker Tool.  Patient has scale at home: yes Patient has pill box at home: yes    High Risk Criteria for Readmission and/or Poor Patient Outcomes: Heart failure hospital admissions (last 6 months): 0  No Show rate: 8 % Difficult social situation: No Demonstrates medication adherence: Yes Primary Language: English Literacy level: Reading, writing, and comprehension  Barriers of Care:   New HF education DIet/ Fluids/ daily weights  Considerations/Referrals:   Referral made to Heart Failure Pharmacist Stewardship: Yes  Referral made to Heart Failure CSW/NCM TOC: No Referral made to Heart & Vascular TOC clinic: yes, 10/12/2022 @ 9 am  Items for Follow-up on DC/TOC: Continue HF education Diet/ fluids/ daily weights   Earnestine Leys, BSN, RN Heart Failure Transport planner Only

## 2022-09-26 NOTE — Progress Notes (Signed)
   Heart Failure Stewardship Pharmacist Progress Note   PCP: Lawerance Cruel, MD PCP-Cardiologist: Jenkins Rouge, MD    HPI:  57 yo M with PMH of GERD, HLD, and T2DM.  Cardiac CT 02/25/20 Calcium score 252, 90th percentile with prox LAD with 50-60% plaque.  Presented to the ED on 1/27 with Vfib arrest. Possible aspiration during code. ROSC after about 50-60 mins of ACLS. Seizure like activity noted initially after ROSC obtained, intubated and sedated. Cath deferred with ST elevations minimized on repeat EKG. Was following all commands, extubated 1/28. Now off all pressors. ECHO 1/28 showed LVEF 30%, regional wall motion abnormalities, G1DD, RV moderately reduced. Taken for cath 1/29.  Current HF Medications: None  Prior to admission HF Medications: None  Pertinent Lab Values: Serum creatinine 1.16, BUN 12, Potassium 3.7, Sodium 138, Magnesium 1.9, A1c 10.8   Vital Signs: Weight: 206 lbs (admission weight: 197 lbs) Blood pressure: 110-130/80s off pressors Heart rate: 110s   Medication Assistance / Insurance Benefits Check: Does the patient have prescription insurance?  Yes Type of insurance plan: BCBS commercial plan  Outpatient Pharmacy:  Prior to admission outpatient pharmacy: [facility] Is the patient willing to use [facility] at discharge? Yes Is the patient willing to transition their outpatient pharmacy to utilize a [facility] outpatient pharmacy?   Pending    Assessment: 1. Acute HFrEF/ICM (LVEF 30%). NYHA class II symptoms. - Off all pressors now. Cath today. Creatinine improved. Monitor BP and start GDMT as allows.     Plan: 1) Medication changes recommended at this time: - None pending cath today  2) Patient assistance: - Farxiga copay $80 - copay card lowers to $0 per month - Entresto copay $80 - copay card lowers to $10 per fill  3)  Education  - To be completed prior to discharge  Kerby Nora, PharmD, BCPS Heart Failure Stewardship  Pharmacist Phone [phone]

## 2022-09-26 NOTE — Progress Notes (Signed)
Patient presented to ED with V-fib arrest secondary to STEMI, recovered consciousness after about 60 minutes resuscitation.  Echo showed LVEF 30% and patient underwent cath today showed occluded mid RCA, and cardiology recommended dual antiplatelet and medical management.  Mentation at baseline and vital signs are stable.

## 2022-09-26 NOTE — Progress Notes (Signed)
NAME:  Brian [name], MRN:  [id number], DOB:  Jun 27, 1966, LOS: 2 ADMISSION DATE:  09/24/2022, CONSULTATION DATE:  09/24/22 REFERRING MD:  [facility] EDP , CHIEF COMPLAINT: Post arrest x1hour   History of Present Illness:  57 yo male presented to [facility] with crushing chest pain, saw the waiting room full and left the ED via his wife picking him up. Per report they did not make it out of the hospital parking lot when he became unresponsive. Pt wife pulled back around and he was noted to be in vfib. He was shocked and broke vfib but reportedly did not have a pulse (suppose pea) then proceeded to code for ~1hour (see code documentation for complete details) 2/2 family wanting to do a video and have minister there. Pt achieved ROSC after 1 hour of acls. He then was noted to have seizure like activity. On 6 epi and 10 norepi, now on 10 norepi (all thru piv). Pt was noted to also have EKG changes consistent with inferior wall MI. Cardiology was contacted for cath but declined admission at this time, perhaps cath in the morning.   CCM was thus contacted for admission.   All history is obtained from chart and EDP as pt is unresponsive on vent.   Of note pt was seen 07/15/22 bu Dr Johnsie Cancel with cardiology. "57 y.o. last seen by cardiology in 2015. Has not been seen in 2 years History of GERD, HLD and  DM-2.  Father and uncle had premature CAD I took care of his dad who had CABG. Had normal ETT 2015 Calcium score was 16 involving the proximal and mid LAD This was 73 rd percentile for age/sex. Cardiac CT 02/25/20 Calcium score 252 , 90 th percentile prox LAD with 50-60% plaque Napkin ring morphology FFR negative in LAD 0.87."   He was ordered for a exercise stress test and follow up in one year. ETT was reportedly normal.   Pt arrived to our ICU on versed infusion and fentanyl, appears to be purposeful. We will stop the versed infusion and do neuro exam. On 47mch levo, adding vaso. Cvc to be placed.   Pertinent  Medical  History  GERD DM2 CAD Hyperlipidemia Renal stones   Significant Hospital Events: Including procedures, antibiotic start and stop dates in addition to other pertinent events   ~1hour ACLS at [facility] 09/24/22 Transferred to [facility] [place] neg,  1/28 extubated 1/29 levo off, insulin being transitioned   Interim History / Subjective:  Extubated yesterday successfully. Insulin gtt being transitioned off, levophed now off Complaining of chest pain and cough, likely 2/2 CPR.  Objective   Blood pressure 113/76, pulse (!) 101, temperature 99 F (37.2 C), temperature source Oral, resp. rate 19, height 5\' 10"  (1.778 m), weight 93.6 kg, SpO2 93 %.    Vent Mode: CPAP;PSV FiO2 (%):  [40 %] 40 % Set Rate:  [28 bmp] 28 bmp Vt Set:  [580 mL] 580 mL PEEP:  [5 cmH20] 5 cmH20 Pressure Support:  [5 cmH20-8 cmH20] 5 cmH20 Plateau Pressure:  [18 cmH20] 18 cmH20   Intake/Output Summary (Last 24 hours) at 09/26/2022 0813 Last data filed at 09/26/2022 0600 Gross per 24 hour  Intake 4397.22 ml  Output 2251 ml  Net 2146.22 ml    Filed Weights   09/25/22 0500 09/25/22 1200 09/26/22 0445  Weight: 92.9 kg 92.9 kg 93.6 kg    Examination: General: Adult male, resting in bed, in NAD. Neuro: A&O x 3, no deficits. HEENT: [place]/AT. Sclerae  anicteric. EOMI. Cardiovascular: RRR, no M/R/G.  Lungs: Respirations even and unlabored.  CTA bilaterally, No W/R/R. Abdomen: BS x 4, soft, NT/ND.  Musculoskeletal: No gross deformities, 1+ edema UE and LE's. Skin: Intact, warm, no rashes.    Assessment & Plan:   IHCA s/p ROSC Vfib, torsades, PEA arrest Acute inferior STEMI - echo 1/28 with LVEF 30%, G1DD, RWA>inferior, posterior, midinferoseptal, and basal inferoseptal hypokinectic, mod reduced RVSF CAD, with calcium score 252, FFR 0.84 in 2021 Plan - Cardiology and EP following, appreciate the assistance. - Will need cardiac cath, defer timing to cards. - Continue heparin, ASA, statin. - Stop Levophed.  Post CPR  chest pain and cough. - Lidocaine patch. - Toradol PRN, Oxycodone PRN. - Tessalon pearls.  Acute hypoxic respiratory failure - s/p intubation on admit and successful extubation 1/28. Possible aspiration during code - PCT elevated at 12. - Continue supplemental O2 as needed to maintain SpO2 > 92%. - Continue Zosyn for now. - Follow cultures.  T2DM - A1c 10.8. - Insulation transition from gtt to SQ. - DM coordinator referral.  Stable for transfer out of ICU if remains off Levophed. Will ask TRH to assume care in AM with PCCM off at that time.   Best Practice (right click and "Reselect all SmartList Selections" daily)   Diet/type: Regular consistency (see orders) DVT prophylaxis: systemic heparin GI prophylaxis: H2B Lines: Central line Foley:  Yes, and it is still needed Code Status:  full code Last date of multidisciplinary goals of care discussion [tbd]   Critical care time:  30 mins    Millicent Blazejewski Shearon Stalls, [place] For pager details, please see AMION or use Epic chat  After 1900, please call [place] for cross coverage needs 09/26/2022, 8:27 AM

## 2022-09-26 NOTE — Progress Notes (Signed)
ANTICOAGULATION CONSULT NOTE   Pharmacy Consult for Heparin  Indication: chest pain/ACS  Allergies  Allergen Reactions   Macrobid [Nitrofurantoin] Other (See Comments)   Morphine Nausea And Vomiting and Other (See Comments)   Niacin Rash    Patient Measurements: Height: 5\' 10"  (177.8 cm) Weight: 93.6 kg (206 lb 5.6 oz) IBW/kg (Calculated) : 73 Heparin DW: 91.7 kg  Vital Signs: Temp: 99 F (37.2 C) (01/29 0000) Temp Source: Oral (01/29 0000) BP: 113/76 (01/29 0600) Pulse Rate: 101 (01/29 0600)  Labs: Recent Labs    09/24/22 1822 09/24/22 1823 09/24/22 1921 09/24/22 2323 09/25/22 0210 09/25/22 0631 09/25/22 1010 09/25/22 1100 09/25/22 1502 09/25/22 1651 09/25/22 2018 09/26/22 0338  HGB 15.9  --    < > 14.3 15.0  --   --   --   --   --   --  11.8*  HCT 44.4  --    < > 42.0 43.5  --   --   --   --   --   --  34.3*  PLT 160  --   --   --  202  --   --   --   --   --   --  142*  HEPARINUNFRC  --   --   --   --   --   --   --  <0.10*  --   --  <0.10* 0.74*  CREATININE 1.04  --   --   --  1.82*   < > 1.38*  --  1.39*  --   --  1.16  TROPONINIHS  --  28*  --   --   --   --   --   --   --  >24,000*  --   --    < > = values in this interval not displayed.     Estimated Creatinine Clearance: 80.7 mL/min (by C-G formula based on SCr of 1.16 mg/dL).   Medical History: Past Medical History:  Diagnosis Date   GERD (gastroesophageal reflux disease)    hx. reflux.   Hyperlipidemia    tx. with meds(high triglycerides)   Recurrent UTI 06/30/2020   Renal cyst 08/17/2020   Ventral hernia     Assessment: 57 y/o M s/p prolonged cardiac arrest to start heparin for inferior STEMI. Managing medically for now with IV heparin.   -Heparin level subtherapeutic on 1500 units/hr, drip increased to 1800 units/hr and now level slightly above goal at 0.74.  No overt bleeding or complications noted.  Heparin running peripherally and heparin level collected by RN from central  line.  Goal of Therapy:  Heparin level 0.3-0.7 units/ml Monitor platelets by anticoagulation protocol: Yes   Plan:  Decrease IV heparin to 1650 units/hr. Repeat heparin level in 6 hrs. Daily heparin level and CBC.  [place] Crane, Roylene Reason, BCCP Clinical Pharmacist  09/26/2022 7:24 AM   [facility] pharmacy phone numbers are listed on [email or web address]

## 2022-09-26 NOTE — Progress Notes (Signed)
[place] Progress Note Patient Name: Brian [name] DOB: Nov 17, 1965 MRN: [id number]   Date of Service  09/26/2022  HPI/Events of Note  Patient has cough and chest pain Likely related to CPR  eICU Interventions  Oxycodone PRN added     Intervention Category Intermediate Interventions: Pain - evaluation and management  Cobain Morici Rodman Pickle 09/26/2022, 1:06 AM

## 2022-09-26 NOTE — H&P (View-Only) (Signed)
Rounding Note    Patient Name: Brian [name] Date of Encounter: 09/26/2022  [place] Cardiologist: Jenkins Rouge, MD   Subjective   Patient awake, alert. Extubated yesterday. Off pressors. Feels well except for chest soreness.  Inpatient Medications    Scheduled Meds:  acetaminophen  650 mg Oral Q6H   aspirin  81 mg Oral Daily   atorvastatin  80 mg Oral Daily   Chlorhexidine Gluconate Cloth  6 each Topical Daily   docusate  100 mg Oral BID   famotidine  20 mg Oral BID   insulin aspart  2-6 Units Subcutaneous Q4H   insulin detemir  15 Units Subcutaneous Q12H   lidocaine  1 patch Transdermal Q24H   Continuous Infusions:  heparin 1,650 Units/hr (09/26/22 0804)   insulin 2.6 Units/hr (09/26/22 0600)   PRN Meds: benzonatate, dextrose, docusate, ketorolac, mouth rinse, mouth rinse, oxyCODONE, polyethylene glycol   Vital Signs    Vitals:   09/26/22 0445 09/26/22 0500 09/26/22 0530 09/26/22 0600  BP:  98/72 104/69 113/76  Pulse:  (!) 101 (!) 102 (!) 101  Resp:  18 18 19   Temp:      TempSrc:      SpO2:  96% 95% 93%  Weight: 93.6 kg     Height:        Intake/Output Summary (Last 24 hours) at 09/26/2022 0914 Last data filed at 09/26/2022 0600 Gross per 24 hour  Intake 4397.22 ml  Output 2251 ml  Net 2146.22 ml       09/26/2022    4:45 AM 09/25/2022   12:00 PM 09/25/2022    5:00 AM  Last 3 Weights  Weight (lbs) 206 lb 5.6 oz 204 lb 12.9 oz 204 lb 12.9 oz  Weight (kg) 93.6 kg 92.9 kg 92.9 kg      Telemetry    Sinus rhythm- Personally Reviewed  ECG    Sinus rhythm- Personally Reviewed  Physical Exam   GEN: No acute distress.  Awake and alert Neck: No JVD Cardiac: RRR, no murmurs, rubs, or gallops.  Respiratory: Clear to auscultation bilaterally. GI: Soft, nontender, non-distended  MS: No edema; No deformity. Neuro:  Nonfocal  Psych: Normal affect   Labs    High Sensitivity Troponin:   Recent Labs  Lab 09/24/22 1823 09/25/22 1651   TROPONINIHS 28* >24,000*      Chemistry Recent Labs  Lab 09/24/22 1822 09/24/22 1921 09/25/22 0631 09/25/22 1010 09/25/22 1502 09/26/22 0338  NA 129*   < > 136 135 136 138  K 4.1   < > 3.3* 3.3* 3.3* 3.7  CL 96*   < > 98 98 95* 100  CO2 20*   < > 22 24 24 29   GLUCOSE 321*   < > 303* 216* 263* 141*  BUN 15   < > 22* 23* 23* 12  CREATININE 1.04   < > 1.54* 1.38* 1.39* 1.16  CALCIUM 8.8*   < > 8.0* 8.3* 8.3* 8.1*  MG  --    < > 2.2 2.1 1.9  --   PROT 7.5  --   --   --   --   --   ALBUMIN 4.2  --   --   --   --   --   AST 39  --   --   --   --   --   ALT 33  --   --   --   --   --  ALKPHOS 99  --   --   --   --   --   BILITOT 0.5  --   --   --   --   --   GFRNONAA >60   < > 52* 60* 59* >60  ANIONGAP 13   < > 16* 13 17* 9   < > = values in this interval not displayed.     Lipids No results for input(s): "CHOL", "TRIG", "HDL", "LABVLDL", "LDLCALC", "CHOLHDL" in the last 168 hours.  Hematology Recent Labs  Lab 09/24/22 1822 09/24/22 1921 09/24/22 2323 09/25/22 0210 09/26/22 0338  WBC 9.9  --   --  23.6* 11.2*  RBC 5.12  --   --  4.81 3.83*  HGB 15.9   < > 14.3 15.0 11.8*  HCT 44.4   < > 42.0 43.5 34.3*  MCV 86.7  --   --  90.4 89.6  MCH 31.1  --   --  31.2 30.8  MCHC 35.8  --   --  34.5 34.4  RDW 12.4  --   --  12.7 12.9  PLT 160  --   --  202 142*   < > = values in this interval not displayed.    Thyroid No results for input(s): "TSH", "FREET4" in the last 168 hours.  BNPNo results for input(s): "BNP", "PROBNP" in the last 168 hours.  DDimer No results for input(s): "DDIMER" in the last 168 hours.   Radiology    ECHOCARDIOGRAM COMPLETE  Result Date: 09/25/2022    ECHOCARDIOGRAM REPORT   Patient Name:   [name] Date of Exam: 09/25/2022 Medical Rec #:  [id number]        Height:       70.0 in Accession #:    [id number]       Weight:       204.8 lb Date of Birth:  1965-11-01         BSA:          2.108 m Patient Age:    56 years         BP:           97/77  mmHg Patient Gender: M                HR:           88 bpm. Exam Location:  Inpatient Procedure: 2D Echo, Cardiac Doppler, Color Doppler and Intracardiac            Opacification Agent STAT ECHO Indications:    Cardiac arrest I46.9  History:        Patient has no prior history of Echocardiogram examinations.                 Risk Factors:GERD and Dyslipidemia.  Sonographer:    Eulah Pont RDCS Referring Phys: [id number] Beulah Gandy PAYNE IMPRESSIONS  1. Left ventricular ejection fraction, by estimation, is 30%. The left ventricle has moderately decreased function. The left ventricle demonstrates regional wall motion abnormalities (see scoring diagram/findings for description). Left ventricular diastolic parameters are consistent with Grade I diastolic dysfunction (impaired relaxation).  2. Right ventricular systolic function is moderately reduced. The right ventricular size is mildly enlarged.  3. The mitral valve is normal in structure. Mild mitral valve regurgitation.  4. The aortic valve is tricuspid. Aortic valve regurgitation is not visualized. Aortic valve sclerosis is present, with no evidence of aortic valve stenosis. FINDINGS  Left Ventricle: Left ventricular ejection fraction,  by estimation, is 30%. The left ventricle has moderately decreased function. The left ventricle demonstrates regional wall motion abnormalities. Definity contrast agent was given IV to delineate the left ventricular endocardial borders. The left ventricular internal cavity size was normal in size. There is no left ventricular hypertrophy. Left ventricular diastolic parameters are consistent with Grade I diastolic dysfunction (impaired relaxation). Normal left ventricular filling pressure.  LV Wall Scoring: The entire inferior wall, posterior wall, mid inferoseptal segment, and basal inferoseptal segment are hypokinetic. Right Ventricle: The right ventricular size is mildly enlarged. No increase in right ventricular wall thickness. Right  ventricular systolic function is moderately reduced. The tricuspid regurgitant velocity is 1.92 m/s, and with an assumed right atrial pressure of 15 mmHg, the estimated right ventricular systolic pressure is 89.3 mmHg. Left Atrium: Left atrial size was normal in size. Right Atrium: Right atrial size was normal in size. Pericardium: There is no evidence of pericardial effusion. Mitral Valve: The mitral valve is normal in structure. Mild mitral valve regurgitation. Tricuspid Valve: The tricuspid valve is normal in structure. Tricuspid valve regurgitation is trivial. Aortic Valve: The aortic valve is tricuspid. Aortic valve regurgitation is not visualized. Aortic valve sclerosis is present, with no evidence of aortic valve stenosis. Pulmonic Valve: The pulmonic valve was grossly normal. Pulmonic valve regurgitation is not visualized. Aorta: The aortic root and ascending aorta are structurally normal, with no evidence of dilitation. Venous: IVC assessment for right atrial pressure unable to be performed due to mechanical ventilation. IAS/Shunts: No atrial level shunt detected by color flow Doppler.  LEFT VENTRICLE PLAX 2D LVIDd:         4.80 cm     Diastology LVIDs:         4.20 cm     LV e' medial:    4.62 cm/s LV PW:         1.00 cm     LV E/e' medial:  7.9 LV IVS:        1.10 cm     LV e' lateral:   5.68 cm/s LVOT diam:     2.10 cm     LV E/e' lateral: 6.4 LV SV:         35 LV SV Index:   16 LVOT Area:     3.46 cm  LV Volumes (MOD) LV vol d, MOD A2C: 77.3 ml LV vol d, MOD A4C: 92.9 ml LV vol s, MOD A2C: 50.5 ml LV vol s, MOD A4C: 60.3 ml LV SV MOD A2C:     26.8 ml LV SV MOD A4C:     92.9 ml LV SV MOD BP:      29.9 ml RIGHT VENTRICLE RV S prime:     5.56 cm/s TAPSE (M-mode): 0.9 cm LEFT ATRIUM             Index        RIGHT ATRIUM           Index LA diam:        3.40 cm 1.61 cm/m   RA Area:     18.30 cm LA Vol (A2C):   35.4 ml 16.79 ml/m  RA Volume:   50.10 ml  23.76 ml/m LA Vol (A4C):   31.9 ml 15.13 ml/m LA  Biplane Vol: 36.8 ml 17.45 ml/m  AORTIC VALVE LVOT Vmax:   67.70 cm/s LVOT Vmean:  48.550 cm/s LVOT VTI:    0.100 m  AORTA Ao Root diam: 3.00 cm Ao Asc diam:  3.50  cm MITRAL VALVE               TRICUSPID VALVE MV Area (PHT): 2.09 cm    TR Peak grad:   14.7 mmHg MV Decel Time: 363 msec    TR Vmax:        192.00 cm/s MV E velocity: 36.40 cm/s MV A velocity: 41.00 cm/s  SHUNTS MV E/A ratio:  0.89        Systemic VTI:  0.10 m                            Systemic Diam: 2.10 cm Dani Gobble Croitoru MD Electronically signed by Sanda Klein MD Signature Date/Time: 09/25/2022/10:44:57 AM    Final    CT HEAD WO CONTRAST (5MM)  Result Date: 09/25/2022 CLINICAL DATA:  Initial evaluation for altered mental status. EXAM: CT HEAD WITHOUT CONTRAST TECHNIQUE: Contiguous axial images were obtained from the base of the skull through the vertex without intravenous contrast. RADIATION DOSE REDUCTION: This exam was performed according to the departmental dose-optimization program which includes automated exposure control, adjustment of the mA and/or kV according to patient size and/or use of iterative reconstruction technique. COMPARISON:  None Available. FINDINGS: Brain: Cerebral volume within normal limits. No acute intracranial hemorrhage. No acute large vessel territory infarct. No mass lesion or midline shift. No hydrocephalus or extra-axial fluid collection. Vascular: No convincing abnormal hyperdense vessel. Skull: Scalp soft tissues and calvarium demonstrate no acute finding. Sinuses/Orbits: Globes orbital soft tissues within normal limits. Trace layering secretions noted within the sphenoid sinuses. Mastoid air cells are clear. Patient is intubated. Other: None. IMPRESSION: Negative head CT. No acute intracranial abnormality identified. Electronically Signed   By: Jeannine Boga M.D.   On: 09/25/2022 01:24   DG CHEST PORT 1 VIEW  Result Date: 09/24/2022 CLINICAL DATA:  Central line and endotracheal tube placement.  EXAM: PORTABLE CHEST 1 VIEW COMPARISON:  Radiograph earlier today. FINDINGS: New left internal jugular central venous catheter tip overlies the upper SVC. No pneumothorax. Endotracheal tube tip is 5.8 cm from the carina. Enteric tube in place with tip below the diaphragm not included in this chest field of view. Lung volumes are low. Stable heart size and mediastinal contours. No developing airspace disease. IMPRESSION: 1. New left internal jugular central venous catheter tip overlies the upper SVC. No pneumothorax. 2. Endotracheal tube tip 5.8 cm from the carina. Enteric tube in place. 3. Low lung volumes. Electronically Signed   By: Keith Rake M.D.   On: 09/24/2022 23:49   DG Abd 1 View  Result Date: 09/24/2022 CLINICAL DATA:  Orogastric tube placement. EXAM: ABDOMEN - 1 VIEW COMPARISON:  None Available. FINDINGS: Tip and side port of the enteric tube below the diaphragm in the stomach. Nonobstructive upper abdominal bowel gas pattern. Moderate volume of stool in the colon. IMPRESSION: Tip and side port of the enteric tube below the diaphragm in the stomach. Electronically Signed   By: Keith Rake M.D.   On: 09/24/2022 23:47   DG Chest Portable 1 View  Result Date: 09/24/2022 CLINICAL DATA:  Cardiac arrest. EXAM: PORTABLE CHEST 1 VIEW COMPARISON:  None Available. FINDINGS: Endotracheal tube tip is approximately 3.5 cm from the carina. Enteric tube is below the diaphragm not included in the field of view. Lung volumes are low. Heart size upper normal. Grossly normal mediastinal contours for technique. No confluent airspace disease, large pleural effusion or visible pneumothorax. Right humeral head arthroplasty. Question postsurgical  change of the left acromioclavicular joint. No grossly displaced rib fractures are seen. IMPRESSION: 1. Endotracheal tube tip approximately 3.5 cm from the carina. Enteric tube is below the diaphragm not included in the field of view. 2. Low lung volumes.  Electronically Signed   By: Keith Rake M.D.   On: 09/24/2022 20:26    Cardiac Studies   TTE   1. Left ventricular ejection fraction, by estimation, is 30%. The left  ventricle has moderately decreased function. The left ventricle  demonstrates regional wall motion abnormalities (see scoring  diagram/findings for description). Left ventricular  diastolic parameters are consistent with Grade I diastolic dysfunction  (impaired relaxation).   2. Right ventricular systolic function is moderately reduced. The right  ventricular size is mildly enlarged.   3. The mitral valve is normal in structure. Mild mitral valve  regurgitation.   4. The aortic valve is tricuspid. Aortic valve regurgitation is not  visualized. Aortic valve sclerosis is present, with no evidence of aortic  valve stenosis.   Patient Profile     57 y.o. male history of hypertension, type 2 diabetes presented to hospital with out of hospital cardiac arrest and inferior STEMI.  Assessment & Plan    1.  Ventricular fibrillation arrest: Likely due to inferior STEMI.  Remains in normal rhythm.  Continue to monitor.  If he does have further arrhythmias, will likely need amiodarone.  2.  Inferior STEMI: Significant inferior ST elevations.  No left heart catheterization was performed as the patient had a prolonged arrest and ST elevations were improving.  EF 30% with inferior/posterior wall motion abnormality. Normal ETT in November. Prior coronary CTA in 2021 with nonobstructive CAD. Patient has fully recovered neurologically from arrest. Need to proceed with cardiac cath today. The procedure and risks were reviewed including but not limited to death, myocardial infarction, stroke, arrythmias, bleeding, transfusion, emergency surgery, dye allergy, or renal dysfunction. The patient voices understanding and is agreeable to proceed.  3.  Acute systolic heart failure: Ejection fraction 30% with hypokinesis of multiple areas.  Likely  due to both inferior STEMI and stenting from prolonged arrest.  He was on pressors- now discontinued. Will need to initiate guideline directed medical therapy once BP stabilized.     4. DM type 2. A1c 10.8 on admit. Per CCM  5. HLD - will check lipid panel. Start high dose statin therapy.    For questions or updates, please contact [place] Please consult [email or web address] for contact info under        Signed, Hiro Vipond Martinique, MD  09/26/2022, 9:14 AM

## 2022-09-26 NOTE — Progress Notes (Signed)
PT Cancellation Note  Patient Details Name: Brian [name] MRN: [id number] DOB: Oct 17, 1965   Cancelled Treatment:    Reason Eval/Treat Not Completed: Patient at procedure or test/unavailable. Pt going to cath lab at this time. May be able to screen per nursing. PT order was automatic order with extubation yesterday. Will check back tomorrow for screen vs eval.   Shary Decamp [facility] 09/26/2022, 12:12 PM

## 2022-09-26 NOTE — Interval H&P Note (Signed)
History and Physical Interval Note:  09/26/2022 12:31 PM  Brian [name]  has presented today for surgery, with the diagnosis of cardiac arrest.  The various methods of treatment have been discussed with the patient and family. After consideration of risks, benefits and other options for treatment, the patient has consented to  Procedure(s): LEFT HEART CATH AND CORONARY ANGIOGRAPHY (N/A) as a surgical intervention.  The patient's history has been reviewed, patient examined, no change in status, stable for surgery.  I have reviewed the patient's chart and labs.  Questions were answered to the patient's satisfaction.    Cath Lab Visit (complete for each Cath Lab visit)  Clinical Evaluation Leading to the Procedure:   ACS: Yes.    Non-ACS:  N/A  [name]

## 2022-09-26 NOTE — Progress Notes (Signed)
Rounding Note    Patient Name: Brian [name] Date of Encounter: 09/26/2022  [place] Cardiologist: Jenkins Rouge, MD   Subjective   Patient awake, alert. Extubated yesterday. Off pressors. Feels well except for chest soreness.  Inpatient Medications    Scheduled Meds:  acetaminophen  650 mg Oral Q6H   aspirin  81 mg Oral Daily   atorvastatin  80 mg Oral Daily   Chlorhexidine Gluconate Cloth  6 each Topical Daily   docusate  100 mg Oral BID   famotidine  20 mg Oral BID   insulin aspart  2-6 Units Subcutaneous Q4H   insulin detemir  15 Units Subcutaneous Q12H   lidocaine  1 patch Transdermal Q24H   Continuous Infusions:  heparin 1,650 Units/hr (09/26/22 0804)   insulin 2.6 Units/hr (09/26/22 0600)   PRN Meds: benzonatate, dextrose, docusate, ketorolac, mouth rinse, mouth rinse, oxyCODONE, polyethylene glycol   Vital Signs    Vitals:   09/26/22 0445 09/26/22 0500 09/26/22 0530 09/26/22 0600  BP:  98/72 104/69 113/76  Pulse:  (!) 101 (!) 102 (!) 101  Resp:  18 18 19   Temp:      TempSrc:      SpO2:  96% 95% 93%  Weight: 93.6 kg     Height:        Intake/Output Summary (Last 24 hours) at 09/26/2022 0914 Last data filed at 09/26/2022 0600 Gross per 24 hour  Intake 4397.22 ml  Output 2251 ml  Net 2146.22 ml       09/26/2022    4:45 AM 09/25/2022   12:00 PM 09/25/2022    5:00 AM  Last 3 Weights  Weight (lbs) 206 lb 5.6 oz 204 lb 12.9 oz 204 lb 12.9 oz  Weight (kg) 93.6 kg 92.9 kg 92.9 kg      Telemetry    Sinus rhythm- Personally Reviewed  ECG    Sinus rhythm- Personally Reviewed  Physical Exam   GEN: No acute distress.  Awake and alert Neck: No JVD Cardiac: RRR, no murmurs, rubs, or gallops.  Respiratory: Clear to auscultation bilaterally. GI: Soft, nontender, non-distended  MS: No edema; No deformity. Neuro:  Nonfocal  Psych: Normal affect   Labs    High Sensitivity Troponin:   Recent Labs  Lab 09/24/22 1823 09/25/22 1651   TROPONINIHS 28* >24,000*      Chemistry Recent Labs  Lab 09/24/22 1822 09/24/22 1921 09/25/22 0631 09/25/22 1010 09/25/22 1502 09/26/22 0338  NA 129*   < > 136 135 136 138  K 4.1   < > 3.3* 3.3* 3.3* 3.7  CL 96*   < > 98 98 95* 100  CO2 20*   < > 22 24 24 29   GLUCOSE 321*   < > 303* 216* 263* 141*  BUN 15   < > 22* 23* 23* 12  CREATININE 1.04   < > 1.54* 1.38* 1.39* 1.16  CALCIUM 8.8*   < > 8.0* 8.3* 8.3* 8.1*  MG  --    < > 2.2 2.1 1.9  --   PROT 7.5  --   --   --   --   --   ALBUMIN 4.2  --   --   --   --   --   AST 39  --   --   --   --   --   ALT 33  --   --   --   --   --  ALKPHOS 99  --   --   --   --   --   BILITOT 0.5  --   --   --   --   --   GFRNONAA >60   < > 52* 60* 59* >60  ANIONGAP 13   < > 16* 13 17* 9   < > = values in this interval not displayed.     Lipids No results for input(s): "CHOL", "TRIG", "HDL", "LABVLDL", "LDLCALC", "CHOLHDL" in the last 168 hours.  Hematology Recent Labs  Lab 09/24/22 1822 09/24/22 1921 09/24/22 2323 09/25/22 0210 09/26/22 0338  WBC 9.9  --   --  23.6* 11.2*  RBC 5.12  --   --  4.81 3.83*  HGB 15.9   < > 14.3 15.0 11.8*  HCT 44.4   < > 42.0 43.5 34.3*  MCV 86.7  --   --  90.4 89.6  MCH 31.1  --   --  31.2 30.8  MCHC 35.8  --   --  34.5 34.4  RDW 12.4  --   --  12.7 12.9  PLT 160  --   --  202 142*   < > = values in this interval not displayed.    Thyroid No results for input(s): "TSH", "FREET4" in the last 168 hours.  BNPNo results for input(s): "BNP", "PROBNP" in the last 168 hours.  DDimer No results for input(s): "DDIMER" in the last 168 hours.   Radiology    ECHOCARDIOGRAM COMPLETE  Result Date: 09/25/2022    ECHOCARDIOGRAM REPORT   Patient Name:   Brian [name] Date of Exam: 09/25/2022 Medical Rec #:  [id number]        Height:       70.0 in Accession #:    [id number]       Weight:       204.8 lb Date of Birth:  1965-11-01         BSA:          2.108 m Patient Age:    56 years         BP:           97/77  mmHg Patient Gender: M                HR:           88 bpm. Exam Location:  Inpatient Procedure: 2D Echo, Cardiac Doppler, Color Doppler and Intracardiac            Opacification Agent STAT ECHO Indications:    Cardiac arrest I46.9  History:        Patient has no prior history of Echocardiogram examinations.                 Risk Factors:GERD and Dyslipidemia.  Sonographer:    Eulah Pont RDCS Referring Phys: [id number] Beulah Gandy PAYNE IMPRESSIONS  1. Left ventricular ejection fraction, by estimation, is 30%. The left ventricle has moderately decreased function. The left ventricle demonstrates regional wall motion abnormalities (see scoring diagram/findings for description). Left ventricular diastolic parameters are consistent with Grade I diastolic dysfunction (impaired relaxation).  2. Right ventricular systolic function is moderately reduced. The right ventricular size is mildly enlarged.  3. The mitral valve is normal in structure. Mild mitral valve regurgitation.  4. The aortic valve is tricuspid. Aortic valve regurgitation is not visualized. Aortic valve sclerosis is present, with no evidence of aortic valve stenosis. FINDINGS  Left Ventricle: Left ventricular ejection fraction,  by estimation, is 30%. The left ventricle has moderately decreased function. The left ventricle demonstrates regional wall motion abnormalities. Definity contrast agent was given IV to delineate the left ventricular endocardial borders. The left ventricular internal cavity size was normal in size. There is no left ventricular hypertrophy. Left ventricular diastolic parameters are consistent with Grade I diastolic dysfunction (impaired relaxation). Normal left ventricular filling pressure.  LV Wall Scoring: The entire inferior wall, posterior wall, mid inferoseptal segment, and basal inferoseptal segment are hypokinetic. Right Ventricle: The right ventricular size is mildly enlarged. No increase in right ventricular wall thickness. Right  ventricular systolic function is moderately reduced. The tricuspid regurgitant velocity is 1.92 m/s, and with an assumed right atrial pressure of 15 mmHg, the estimated right ventricular systolic pressure is 89.3 mmHg. Left Atrium: Left atrial size was normal in size. Right Atrium: Right atrial size was normal in size. Pericardium: There is no evidence of pericardial effusion. Mitral Valve: The mitral valve is normal in structure. Mild mitral valve regurgitation. Tricuspid Valve: The tricuspid valve is normal in structure. Tricuspid valve regurgitation is trivial. Aortic Valve: The aortic valve is tricuspid. Aortic valve regurgitation is not visualized. Aortic valve sclerosis is present, with no evidence of aortic valve stenosis. Pulmonic Valve: The pulmonic valve was grossly normal. Pulmonic valve regurgitation is not visualized. Aorta: The aortic root and ascending aorta are structurally normal, with no evidence of dilitation. Venous: IVC assessment for right atrial pressure unable to be performed due to mechanical ventilation. IAS/Shunts: No atrial level shunt detected by color flow Doppler.  LEFT VENTRICLE PLAX 2D LVIDd:         4.80 cm     Diastology LVIDs:         4.20 cm     LV e' medial:    4.62 cm/s LV PW:         1.00 cm     LV E/e' medial:  7.9 LV IVS:        1.10 cm     LV e' lateral:   5.68 cm/s LVOT diam:     2.10 cm     LV E/e' lateral: 6.4 LV SV:         35 LV SV Index:   16 LVOT Area:     3.46 cm  LV Volumes (MOD) LV vol d, MOD A2C: 77.3 ml LV vol d, MOD A4C: 92.9 ml LV vol s, MOD A2C: 50.5 ml LV vol s, MOD A4C: 60.3 ml LV SV MOD A2C:     26.8 ml LV SV MOD A4C:     92.9 ml LV SV MOD BP:      29.9 ml RIGHT VENTRICLE RV S prime:     5.56 cm/s TAPSE (M-mode): 0.9 cm LEFT ATRIUM             Index        RIGHT ATRIUM           Index LA diam:        3.40 cm 1.61 cm/m   RA Area:     18.30 cm LA Vol (A2C):   35.4 ml 16.79 ml/m  RA Volume:   50.10 ml  23.76 ml/m LA Vol (A4C):   31.9 ml 15.13 ml/m LA  Biplane Vol: 36.8 ml 17.45 ml/m  AORTIC VALVE LVOT Vmax:   67.70 cm/s LVOT Vmean:  48.550 cm/s LVOT VTI:    0.100 m  AORTA Ao Root diam: 3.00 cm Ao Asc diam:  3.50  cm MITRAL VALVE               TRICUSPID VALVE MV Area (PHT): 2.09 cm    TR Peak grad:   14.7 mmHg MV Decel Time: 363 msec    TR Vmax:        192.00 cm/s MV E velocity: 36.40 cm/s MV A velocity: 41.00 cm/s  SHUNTS MV E/A ratio:  0.89        Systemic VTI:  0.10 m                            Systemic Diam: 2.10 cm Dani Gobble Croitoru MD Electronically signed by Sanda Klein MD Signature Date/Time: 09/25/2022/10:44:57 AM    Final    CT HEAD WO CONTRAST (5MM)  Result Date: 09/25/2022 CLINICAL DATA:  Initial evaluation for altered mental status. EXAM: CT HEAD WITHOUT CONTRAST TECHNIQUE: Contiguous axial images were obtained from the base of the skull through the vertex without intravenous contrast. RADIATION DOSE REDUCTION: This exam was performed according to the departmental dose-optimization program which includes automated exposure control, adjustment of the mA and/or kV according to patient size and/or use of iterative reconstruction technique. COMPARISON:  None Available. FINDINGS: Brain: Cerebral volume within normal limits. No acute intracranial hemorrhage. No acute large vessel territory infarct. No mass lesion or midline shift. No hydrocephalus or extra-axial fluid collection. Vascular: No convincing abnormal hyperdense vessel. Skull: Scalp soft tissues and calvarium demonstrate no acute finding. Sinuses/Orbits: Globes orbital soft tissues within normal limits. Trace layering secretions noted within the sphenoid sinuses. Mastoid air cells are clear. Patient is intubated. Other: None. IMPRESSION: Negative head CT. No acute intracranial abnormality identified. Electronically Signed   By: Jeannine Boga M.D.   On: 09/25/2022 01:24   DG CHEST PORT 1 VIEW  Result Date: 09/24/2022 CLINICAL DATA:  Central line and endotracheal tube placement.  EXAM: PORTABLE CHEST 1 VIEW COMPARISON:  Radiograph earlier today. FINDINGS: New left internal jugular central venous catheter tip overlies the upper SVC. No pneumothorax. Endotracheal tube tip is 5.8 cm from the carina. Enteric tube in place with tip below the diaphragm not included in this chest field of view. Lung volumes are low. Stable heart size and mediastinal contours. No developing airspace disease. IMPRESSION: 1. New left internal jugular central venous catheter tip overlies the upper SVC. No pneumothorax. 2. Endotracheal tube tip 5.8 cm from the carina. Enteric tube in place. 3. Low lung volumes. Electronically Signed   By: Keith Rake M.D.   On: 09/24/2022 23:49   DG Abd 1 View  Result Date: 09/24/2022 CLINICAL DATA:  Orogastric tube placement. EXAM: ABDOMEN - 1 VIEW COMPARISON:  None Available. FINDINGS: Tip and side port of the enteric tube below the diaphragm in the stomach. Nonobstructive upper abdominal bowel gas pattern. Moderate volume of stool in the colon. IMPRESSION: Tip and side port of the enteric tube below the diaphragm in the stomach. Electronically Signed   By: Keith Rake M.D.   On: 09/24/2022 23:47   DG Chest Portable 1 View  Result Date: 09/24/2022 CLINICAL DATA:  Cardiac arrest. EXAM: PORTABLE CHEST 1 VIEW COMPARISON:  None Available. FINDINGS: Endotracheal tube tip is approximately 3.5 cm from the carina. Enteric tube is below the diaphragm not included in the field of view. Lung volumes are low. Heart size upper normal. Grossly normal mediastinal contours for technique. No confluent airspace disease, large pleural effusion or visible pneumothorax. Right humeral head arthroplasty. Question postsurgical  change of the left acromioclavicular joint. No grossly displaced rib fractures are seen. IMPRESSION: 1. Endotracheal tube tip approximately 3.5 cm from the carina. Enteric tube is below the diaphragm not included in the field of view. 2. Low lung volumes.  Electronically Signed   By: Keith Rake M.D.   On: 09/24/2022 20:26    Cardiac Studies   TTE   1. Left ventricular ejection fraction, by estimation, is 30%. The left  ventricle has moderately decreased function. The left ventricle  demonstrates regional wall motion abnormalities (see scoring  diagram/findings for description). Left ventricular  diastolic parameters are consistent with Grade I diastolic dysfunction  (impaired relaxation).   2. Right ventricular systolic function is moderately reduced. The right  ventricular size is mildly enlarged.   3. The mitral valve is normal in structure. Mild mitral valve  regurgitation.   4. The aortic valve is tricuspid. Aortic valve regurgitation is not  visualized. Aortic valve sclerosis is present, with no evidence of aortic  valve stenosis.   Patient Profile     57 y.o. male history of hypertension, type 2 diabetes presented to hospital with out of hospital cardiac arrest and inferior STEMI.  Assessment & Plan    1.  Ventricular fibrillation arrest: Likely due to inferior STEMI.  Remains in normal rhythm.  Continue to monitor.  If he does have further arrhythmias, will likely need amiodarone.  2.  Inferior STEMI: Significant inferior ST elevations.  No left heart catheterization was performed as the patient had a prolonged arrest and ST elevations were improving.  EF 30% with inferior/posterior wall motion abnormality. Normal ETT in November. Prior coronary CTA in 2021 with nonobstructive CAD. Patient has fully recovered neurologically from arrest. Need to proceed with cardiac cath today. The procedure and risks were reviewed including but not limited to death, myocardial infarction, stroke, arrythmias, bleeding, transfusion, emergency surgery, dye allergy, or renal dysfunction. The patient voices understanding and is agreeable to proceed.  3.  Acute systolic heart failure: Ejection fraction 30% with hypokinesis of multiple areas.  Likely  due to both inferior STEMI and stenting from prolonged arrest.  He was on pressors- now discontinued. Will need to initiate guideline directed medical therapy once BP stabilized.     4. DM type 2. A1c 10.8 on admit. Per CCM  5. HLD - will check lipid panel. Start high dose statin therapy.    For questions or updates, please contact [place] Please consult [email or web address] for contact info under        Signed, Becky Colan [place], MD  09/26/2022, 9:14 AM

## 2022-09-27 ENCOUNTER — Other Ambulatory Visit (HOSPITAL_COMMUNITY): Payer: Self-pay

## 2022-09-27 ENCOUNTER — Telehealth (HOSPITAL_COMMUNITY): Payer: Self-pay | Admitting: Pharmacy Technician

## 2022-09-27 DIAGNOSIS — I469 Cardiac arrest, cause unspecified: Secondary | ICD-10-CM | POA: Diagnosis not present

## 2022-09-27 DIAGNOSIS — I5021 Acute systolic (congestive) heart failure: Secondary | ICD-10-CM

## 2022-09-27 DIAGNOSIS — I2111 ST elevation (STEMI) myocardial infarction involving right coronary artery: Secondary | ICD-10-CM | POA: Diagnosis not present

## 2022-09-27 LAB — BASIC METABOLIC PANEL
Anion gap: 5 (ref 5–15)
BUN: 11 mg/dL (ref 6–20)
CO2: 27 mmol/L (ref 22–32)
Calcium: 8.7 mg/dL — ABNORMAL LOW (ref 8.9–10.3)
Chloride: 106 mmol/L (ref 98–111)
Creatinine, Ser: 1.01 mg/dL (ref 0.61–1.24)
GFR, Estimated: >60 mL/min (ref >60)
Glucose, Bld: 99 mg/dL (ref 70–99)
Potassium: 4 mmol/L (ref 3.5–5.1)
Sodium: 138 mmol/L (ref 135–145)

## 2022-09-27 LAB — CBC
HCT: 33.8 % — ABNORMAL LOW (ref 39.0–52.0)
Hemoglobin: 11.7 g/dL — ABNORMAL LOW (ref 13.0–17.0)
MCH: 31.5 pg (ref 26.0–34.0)
MCHC: 34.6 g/dL (ref 30.0–36.0)
MCV: 90.9 fL (ref 80.0–100.0)
Platelets: 125 10*3/uL — ABNORMAL LOW (ref 150–400)
RBC: 3.72 MIL/uL — ABNORMAL LOW (ref 4.22–5.81)
RDW: 13.1 % (ref 11.5–15.5)
WBC: 8.7 10*3/uL (ref 4.0–10.5)
nRBC: 0 % (ref 0.0–0.2)

## 2022-09-27 LAB — BRAIN NATRIURETIC PEPTIDE: B Natriuretic Peptide: 517.8 pg/mL — ABNORMAL HIGH (ref 0.0–100.0)

## 2022-09-27 LAB — GLUCOSE, CAPILLARY
Glucose-Capillary: 102 mg/dL — ABNORMAL HIGH (ref 70–99)
Glucose-Capillary: 123 mg/dL — ABNORMAL HIGH (ref 70–99)
Glucose-Capillary: 199 mg/dL — ABNORMAL HIGH (ref 70–99)
Glucose-Capillary: 111 mg/dL — ABNORMAL HIGH (ref 70–99)
Glucose-Capillary: 107 mg/dL — ABNORMAL HIGH (ref 70–99)

## 2022-09-27 LAB — MAGNESIUM: Magnesium: 1.9 mg/dL (ref 1.7–2.4)

## 2022-09-27 MED ORDER — MELATONIN 3 MG PO TABS
6.0000 mg | ORAL_TABLET | Freq: Every day | ORAL | Status: DC
  Administered 2022-09-27: 6 mg via ORAL
  Filled 2022-09-27: qty 2

## 2022-09-27 MED ORDER — LIVING WELL WITH DIABETES BOOK
Freq: Once | Status: AC
  Filled 2022-09-27 (×2): qty 1

## 2022-09-27 MED ORDER — DM-GUAIFENESIN ER 30-600 MG PO TB12
1.0000 | ORAL_TABLET | Freq: Two times a day (BID) | ORAL | Status: DC
  Administered 2022-09-27 – 2022-09-28 (×3): 1 via ORAL
  Filled 2022-09-27 (×3): qty 1

## 2022-09-27 MED ORDER — SACUBITRIL-VALSARTAN 24-26 MG PO TABS
1.0000 | ORAL_TABLET | Freq: Two times a day (BID) | ORAL | Status: DC
  Administered 2022-09-27 – 2022-09-28 (×3): 1 via ORAL
  Filled 2022-09-27 (×4): qty 1

## 2022-09-27 MED ORDER — ORAL CARE MOUTH RINSE: 15.0000 mL | OROMUCOSAL | Status: DC | PRN

## 2022-09-27 MED ORDER — DAPAGLIFLOZIN PROPANEDIOL 10 MG PO TABS
10.0000 mg | ORAL_TABLET | Freq: Every day | ORAL | Status: DC
  Administered 2022-09-27 – 2022-09-28 (×2): 10 mg via ORAL
  Filled 2022-09-27 (×2): qty 1

## 2022-09-27 MED ORDER — CARVEDILOL 6.25 MG PO TABS
6.2500 mg | ORAL_TABLET | Freq: Two times a day (BID) | ORAL | Status: DC
  Administered 2022-09-27 (×2): 6.25 mg via ORAL
  Filled 2022-09-27 (×2): qty 1

## 2022-09-27 MED ORDER — ICOSAPENT ETHYL 1 G PO CAPS
2.0000 g | ORAL_CAPSULE | Freq: Two times a day (BID) | ORAL | Status: DC
  Administered 2022-09-27 – 2022-09-28 (×2): 2 g via ORAL
  Filled 2022-09-27 (×3): qty 2

## 2022-09-27 NOTE — Progress Notes (Signed)
Heart Failure Navigator Progress Note  Following this hospitalization to assess for HV TOC readiness.   Patient getting ready to work with PT, Plan to interview this afternoon for HF TOC.   Earnestine Leys, BSN, Clinical cytogeneticist Only

## 2022-09-27 NOTE — Evaluation (Signed)
Occupational Therapy Evaluation Patient Details Name: Brian [name] MRN: [id number] DOB: 01/07/1966 Today's Date: 09/27/2022   History of Present Illness Pt is 57 year old presented to [facility] on  09/24/22 with V-fib arrest and inferior STEMI. Pt 1 hour ACLS at [facility]. Transferred to [facility]. Pt PMH - HTN, DM, lumbar lam for epidural abscess   Clinical Impression   Brian [name] was evaluated s/p the above admission list, he is indep at baseline and active. Upon evaluation he had mild limitations due to rib/sternal pain and L hemibody paraesthesias/ fine motor sensory motor deficits. Overall he was mod I-indep with all mobility and ADLs and cog was [facility]. Educated pt on fine motor coordination exercises and encouraged use of LUE for BADLs. OT to follow acutely to review exercises. Recommend d/c home without follow up needed.      Recommendations for follow up therapy are one component of a multi-disciplinary discharge planning process, led by the attending physician.  Recommendations may be updated based on patient status, additional functional criteria and insurance authorization.   Follow Up Recommendations  No OT follow up - cardiac rehab    Assistance Recommended at Discharge PRN  Patient can return home with the following Assist for transportation    Functional Status Assessment  Patient has had a recent decline in their functional status and demonstrates the ability to make significant improvements in function in a reasonable and predictable amount of time.  Equipment Recommendations  None recommended by OT       Precautions / Restrictions Precautions Precautions: Fall Restrictions Weight Bearing Restrictions: No      Mobility Bed Mobility Overal bed mobility: Independent                  Transfers Overall transfer level: Modified independent            Balance Overall balance assessment: No apparent balance deficits (not formally assessed)       ADL either performed  or assessed with clinical judgement   ADL Overall ADL's : Modified independent       General ADL Comments: generalized supervision but demonstrated mod I. L hand FM deficits not affecting BADLs. Educated pt on continued use and practice with LUE     Vision Baseline Vision/History: 0 No visual deficits Vision Assessment?: No apparent visual deficits     Perception Perception Perception Tested?: No   Praxis Praxis Praxis tested?: Not tested    Pertinent Vitals/Pain Pain Assessment Pain Assessment: Faces Faces Pain Scale: Hurts little more Pain Location: chest/sternum Pain Descriptors / Indicators: Sore Pain Intervention(s): Limited activity within patient's tolerance, Monitored during session     Hand Dominance Right   Extremity/Trunk Assessment Upper Extremity Assessment Upper Extremity Assessment: LUE deficits/detail LUE Deficits / Details: slowed, deliberate fine motor coordination. difficulty with thumb to pinky oppositition. mildly weaker than right (he is RDH). LUE Sensation: WNL LUE Coordination: decreased fine motor   Lower Extremity Assessment Lower Extremity Assessment: Defer to PT evaluation LLE Deficits / Details: Pt reports LLE feels "off" but denies sensory changes and strength is normal   Cervical / Trunk Assessment Cervical / Trunk Assessment: Normal   Communication Communication Communication: No difficulties   Cognition Arousal/Alertness: Awake/alert Behavior During Therapy: WFL for tasks assessed/performed Overall Cognitive Status: Within Functional Limits for tasks assessed           General Comments  VSS on RA            Home Living Family/patient expects to be  discharged to:: Private residence Living Arrangements: Spouse/significant other Available Help at Discharge: Family Type of Home: House Home Access: Stairs to enter [employer] of Steps: 2   [place]: Two level;Able to live on main level with  bedroom/bathroom;Bed/bath upstairs Alternate Level Stairs-Number of Steps: flight Alternate Level Stairs-Rails: Right           Home Equipment: None          Prior Functioning/Environment Prior Level of Function : Independent/Modified Independent;Working/employed;Driving             Mobility Comments: no AD ADLs Comments: works, Garment/textile technologist, golfs        OT Problem List: Impaired UE functional use;Decreased coordination      OT Treatment/Interventions: Therapeutic exercise;Therapeutic activities    OT Goals(Current goals can be found in the care plan section) Acute Rehab OT Goals Patient Stated Goal: back to 100% OT Goal Formulation: With patient Time For Goal Achievement: 10/11/22 Potential to Achieve Goals: Good ADL Goals Additional ADL Goal #1: Pt will indep complete fine motor coordination/strength exercises  OT Frequency: Min 2X/week       AM-PAC OT "6 Clicks" Daily Activity     Outcome Measure Help from another person eating meals?: None Help from another person taking care of personal grooming?: None Help from another person toileting, which includes using toliet, bedpan, or urinal?: None Help from another person bathing (including washing, rinsing, drying)?: None Help from another person to put on and taking off regular upper body clothing?: None Help from another person to put on and taking off regular lower body clothing?: None 6 Click Score: 24   End of Session Nurse Communication: Mobility status  Activity Tolerance: Patient tolerated treatment well Patient left: in bed;with call bell/phone within reach  OT Visit Diagnosis: Hemiplegia and hemiparesis Hemiplegia - Right/Left: Left Hemiplegia - dominant/non-dominant: Non-Dominant                Time: 2979-8921 OT Time Calculation (min): 21 min Charges:  OT General Charges $OT Visit: 1 Visit OT Evaluation $OT Eval Low Complexity: 1 Low   Koi Yarbro D Causey 09/27/2022, 4:54 PM

## 2022-09-27 NOTE — Progress Notes (Signed)
Rounding Note    Patient Name: Brian [name] Date of Encounter: 09/27/2022  [place] Cardiologist: Jenkins Rouge, MD   Subjective   Patient awake, alert.  Feels well except for continued chest soreness.  Inpatient Medications    Scheduled Meds:  acetaminophen  650 mg Oral Q6H   aspirin  81 mg Oral Daily   atorvastatin  80 mg Oral Daily   carvedilol  6.25 mg Oral BID WC   Chlorhexidine Gluconate Cloth  6 each Topical Daily   clopidogrel  75 mg Oral Q breakfast   dapagliflozin propanediol  10 mg Oral Daily   docusate  100 mg Oral BID   enoxaparin (LOVENOX) injection  40 mg Subcutaneous Q24H   famotidine  20 mg Oral BID   insulin aspart  2-6 Units Subcutaneous Q4H   insulin detemir  15 Units Subcutaneous Q12H   lidocaine  1 patch Transdermal Q24H   sacubitril-valsartan  1 tablet Oral BID   sodium chloride flush  3 mL Intravenous Q12H   Continuous Infusions:  sodium chloride Stopped (09/27/22 0353)   insulin Stopped (09/26/22 1136)   PRN Meds: sodium chloride, benzonatate, dextrose, docusate, mouth rinse, mouth rinse, mouth rinse, mouth rinse, oxyCODONE, polyethylene glycol, sodium chloride flush   Vital Signs    Vitals:   09/27/22 0200 09/27/22 0300 09/27/22 0400 09/27/22 0500  BP: 132/79 120/82 (!) 129/94 134/83  Pulse: (!) 102 (!) 104 (!) 109 (!) 106  Resp: 12 14 (!) 24 15  Temp:   98.2 F (36.8 C)   TempSrc:   Oral   SpO2: 91% 91% 94% 93%  Weight:   92 kg   Height:        Intake/Output Summary (Last 24 hours) at 09/27/2022 0742 Last data filed at 09/27/2022 0400 Gross per 24 hour  Intake 1335.11 ml  Output 3601 ml  Net -2265.89 ml       09/27/2022    4:00 AM 09/26/2022    4:45 AM 09/25/2022   12:00 PM  Last 3 Weights  Weight (lbs) 202 lb 13.2 oz 206 lb 5.6 oz 204 lb 12.9 oz  Weight (kg) 92 kg 93.6 kg 92.9 kg      Telemetry    Sinus rhythm-rare PVC Personally Reviewed  ECG    Sinus rhythm- Personally Reviewed  Physical Exam    GEN: No acute distress.  Awake and alert Neck: No JVD Cardiac: RRR, no murmurs, rubs, or gallops.  Respiratory: Clear to auscultation bilaterally. GI: Soft, nontender, non-distended  MS: No edema; No deformity. Radial site without hematoma Neuro:  Nonfocal  Psych: Normal affect   Labs    High Sensitivity Troponin:   Recent Labs  Lab 09/24/22 1823 09/25/22 1651  TROPONINIHS 28* >24,000*      Chemistry Recent Labs  Lab 09/24/22 1822 09/24/22 1921 09/25/22 1010 09/25/22 1502 09/26/22 0338 09/27/22 0403 09/27/22 0627  NA 129*   < > 135 136 138 138  --   K 4.1   < > 3.3* 3.3* 3.7 4.0  --   CL 96*   < > 98 95* 100 106  --   CO2 20*   < > 24 24 29 27   --   GLUCOSE 321*   < > 216* 263* 141* 99  --   BUN 15   < > 23* 23* 12 11  --   CREATININE 1.04   < > 1.38* 1.39* 1.16 1.01  --   CALCIUM 8.8*   < >  8.3* 8.3* 8.1* 8.7*  --   MG  --    < > 2.1 1.9  --   --  1.9  PROT 7.5  --   --   --   --   --   --   ALBUMIN 4.2  --   --   --   --   --   --   AST 39  --   --   --   --   --   --   ALT 33  --   --   --   --   --   --   ALKPHOS 99  --   --   --   --   --   --   BILITOT 0.5  --   --   --   --   --   --   GFRNONAA >60   < > 60* 59* >60 >60  --   ANIONGAP 13   < > 13 17* 9 5  --    < > = values in this interval not displayed.     Lipids  Recent Labs  Lab 09/26/22 0338  CHOL 107  TRIG 428*  HDL 18*  LDLCALC UNABLE TO CALCULATE IF TRIGLYCERIDE OVER 400 mg/dL  CHOLHDL 5.9    Hematology Recent Labs  Lab 09/25/22 0210 09/26/22 0338 09/27/22 0403  WBC 23.6* 11.2* 8.7  RBC 4.81 3.83* 3.72*  HGB 15.0 11.8* 11.7*  HCT 43.5 34.3* 33.8*  MCV 90.4 89.6 90.9  MCH 31.2 30.8 31.5  MCHC 34.5 34.4 34.6  RDW 12.7 12.9 13.1  PLT 202 142* 125*    Thyroid No results for input(s): "TSH", "FREET4" in the last 168 hours.  BNPNo results for input(s): "BNP", "PROBNP" in the last 168 hours.  DDimer No results for input(s): "DDIMER" in the last 168 hours.   Radiology     CARDIAC CATHETERIZATION  Result Date: 09/26/2022 Conclusions: Severe single-vessel coronary artery disease; culprit lesion for the patient's inferior STEMI and cardiac arrest is likely 100% occlusion of mid RCA.  There is moderate (50-60%) disease involving the proximal LAD and mild (20%) stenosis of the proximal LCx. Mildly elevated left ventricular filling pressure (LVEDP 20 mmHg). Recommendations: Continue aggressive medical therapy; defer PCI to occluded mid RCA given STEMI onset was ~48 hours ago and patient is without residual angina or shock. Dual antiplatelet therapy with aspirin and clopidogrel for 12 months. Escalate goal-directed medical therapy for acute HFrEF, as tolerated. Nelva Bush, MD [facility]  ECHOCARDIOGRAM COMPLETE  Result Date: 09/25/2022    ECHOCARDIOGRAM REPORT   Patient Name:   Brian [name] Date of Exam: 09/25/2022 Medical Rec #:  [id number]        Height:       70.0 in Accession #:    [id number]       Weight:       204.8 lb Date of Birth:  08/09/1966         BSA:          2.108 m Patient Age:    57 years         BP:           97/77 mmHg Patient Gender: M                HR:           88 bpm. Exam Location:  Inpatient Procedure: 2D Echo, Cardiac Doppler, Color Doppler and Intracardiac  Opacification Agent STAT ECHO Indications:    Cardiac arrest I46.9  History:        Patient has no prior history of Echocardiogram examinations.                 Risk Factors:GERD and Dyslipidemia.  Sonographer:    Bernadene Person RDCS Referring Phys: [id number] [place]  1. Left ventricular ejection fraction, by estimation, is 30%. The left ventricle has moderately decreased function. The left ventricle demonstrates regional wall motion abnormalities (see scoring diagram/findings for description). Left ventricular diastolic parameters are consistent with Grade I diastolic dysfunction (impaired relaxation).  2. Right ventricular systolic function is moderately reduced.  The right ventricular size is mildly enlarged.  3. The mitral valve is normal in structure. Mild mitral valve regurgitation.  4. The aortic valve is tricuspid. Aortic valve regurgitation is not visualized. Aortic valve sclerosis is present, with no evidence of aortic valve stenosis. FINDINGS  Left Ventricle: Left ventricular ejection fraction, by estimation, is 30%. The left ventricle has moderately decreased function. The left ventricle demonstrates regional wall motion abnormalities. Definity contrast agent was given IV to delineate the left ventricular endocardial borders. The left ventricular internal cavity size was normal in size. There is no left ventricular hypertrophy. Left ventricular diastolic parameters are consistent with Grade I diastolic dysfunction (impaired relaxation). Normal left ventricular filling pressure.  LV Wall Scoring: The entire inferior wall, posterior wall, mid inferoseptal segment, and basal inferoseptal segment are hypokinetic. Right Ventricle: The right ventricular size is mildly enlarged. No increase in right ventricular wall thickness. Right ventricular systolic function is moderately reduced. The tricuspid regurgitant velocity is 1.92 m/s, and with an assumed right atrial pressure of 15 mmHg, the estimated right ventricular systolic pressure is 45.4 mmHg. Left Atrium: Left atrial size was normal in size. Right Atrium: Right atrial size was normal in size. Pericardium: There is no evidence of pericardial effusion. Mitral Valve: The mitral valve is normal in structure. Mild mitral valve regurgitation. Tricuspid Valve: The tricuspid valve is normal in structure. Tricuspid valve regurgitation is trivial. Aortic Valve: The aortic valve is tricuspid. Aortic valve regurgitation is not visualized. Aortic valve sclerosis is present, with no evidence of aortic valve stenosis. Pulmonic Valve: The pulmonic valve was grossly normal. Pulmonic valve regurgitation is not visualized. Aorta: The  aortic root and ascending aorta are structurally normal, with no evidence of dilitation. Venous: IVC assessment for right atrial pressure unable to be performed due to mechanical ventilation. IAS/Shunts: No atrial level shunt detected by color flow Doppler.  LEFT VENTRICLE PLAX 2D LVIDd:         4.80 cm     Diastology LVIDs:         4.20 cm     LV e' medial:    4.62 cm/s LV PW:         1.00 cm     LV E/e' medial:  7.9 LV IVS:        1.10 cm     LV e' lateral:   5.68 cm/s LVOT diam:     2.10 cm     LV E/e' lateral: 6.4 LV SV:         35 LV SV Index:   16 LVOT Area:     3.46 cm  LV Volumes (MOD) LV vol d, MOD A2C: 77.3 ml LV vol d, MOD A4C: 92.9 ml LV vol s, MOD A2C: 50.5 ml LV vol s, MOD A4C: 60.3 ml LV SV MOD A2C:  26.8 ml LV SV MOD A4C:     92.9 ml LV SV MOD BP:      29.9 ml RIGHT VENTRICLE RV S prime:     5.56 cm/s TAPSE (M-mode): 0.9 cm LEFT ATRIUM             Index        RIGHT ATRIUM           Index LA diam:        3.40 cm 1.61 cm/m   RA Area:     18.30 cm LA Vol (A2C):   35.4 ml 16.79 ml/m  RA Volume:   50.10 ml  23.76 ml/m LA Vol (A4C):   31.9 ml 15.13 ml/m LA Biplane Vol: 36.8 ml 17.45 ml/m  AORTIC VALVE LVOT Vmax:   67.70 cm/s LVOT Vmean:  48.550 cm/s LVOT VTI:    0.100 m  AORTA Ao Root diam: 3.00 cm Ao Asc diam:  3.50 cm MITRAL VALVE               TRICUSPID VALVE MV Area (PHT): 2.09 cm    TR Peak grad:   14.7 mmHg MV Decel Time: 363 msec    TR Vmax:        192.00 cm/s MV E velocity: 36.40 cm/s MV A velocity: 41.00 cm/s  SHUNTS MV E/A ratio:  0.89        Systemic VTI:  0.10 m                            Systemic Diam: 2.10 cm Dani Gobble Croitoru MD Electronically signed by Sanda Klein MD Signature Date/Time: 09/25/2022/10:44:57 AM    Final     Cardiac Studies   TTE   1. Left ventricular ejection fraction, by estimation, is 30%. The left  ventricle has moderately decreased function. The left ventricle  demonstrates regional wall motion abnormalities (see scoring  diagram/findings for  description). Left ventricular  diastolic parameters are consistent with Grade I diastolic dysfunction  (impaired relaxation).   2. Right ventricular systolic function is moderately reduced. The right  ventricular size is mildly enlarged.   3. The mitral valve is normal in structure. Mild mitral valve  regurgitation.   4. The aortic valve is tricuspid. Aortic valve regurgitation is not  visualized. Aortic valve sclerosis is present, with no evidence of aortic  valve stenosis.   Cardiac cath:Conclusion  Conclusions: Severe single-vessel coronary artery disease; culprit lesion for the patient's inferior STEMI and cardiac arrest is likely 100% occlusion of mid RCA.  There is moderate (50-60%) disease involving the proximal LAD and mild (20%) stenosis of the proximal LCx. Mildly elevated left ventricular filling pressure (LVEDP 20 mmHg).   Recommendations: Continue aggressive medical therapy; defer PCI to occluded mid RCA given STEMI onset was ~48 hours ago and patient is without residual angina or shock. Dual antiplatelet therapy with aspirin and clopidogrel for 12 months. Escalate goal-directed medical therapy for acute HFrEF, as tolerated.   Nelva Bush, MD [facility]  Coronary Diagrams  Diagnostic Dominance: Right  Intervention   Patient Profile     57 y.o. male history of hypertension, type 2 diabetes presented to hospital with out of hospital cardiac arrest and inferior STEMI.  Assessment & Plan    1.  Ventricular fibrillation arrest: secondary to acute  inferior STEMI.  Remains in normal rhythm.  Continue to monitor.  If he does have further arrhythmias, will likely need amiodarone. Will  need Lifevest at discharge. Reassess LV function at 3 months. If EF remains < 35% will need ICD. Plan transfer to floor today and progress activity with OT/PT  2.  Inferior STEMI: Inferior ST elevation,.  Did not have emergent cardiac cath as the patient had a prolonged arrest and  ST elevations were improving.  EF 30% with inferior/posterior wall motion abnormality. Normal ETT in November. Prior coronary CTA in 2021 with nonobstructive CAD. Cardiac cath demonstrated occluded mid RCA. Nonobstructive disease in the mid LAD. Plan medical management. On DAPT, statin. Will initiate beta blocker.   3.  Acute systolic heart failure: Ejection fraction 30% with hypokinesis of multiple areas.  Likely due to both inferior STEMI and stunning from prolonged arrest.  BP has improved off pressors. Can initiate guideline directed medical therapy. Will start Entresto 24/26 mg bid. Coreg 6.25 mg bid. Add Farxiga 10 mg daily. Titrate as tolerated. Consider adding aldactone prior to DC.     4. DM type 2. A1c 10.8 on admit. Per CCM  5. HLD - on high dose statin therapy. Also has triglycerides elevation 428 due to poorly controlled DM.     For questions or updates, please contact [facility] Please consult [email or web address] for contact info under        Signed, Fleta Borgeson [place], MD  09/27/2022, 7:42 AM

## 2022-09-27 NOTE — Telephone Encounter (Signed)
Patient Advocate Encounter  Prior Authorization for Vascepa 1GM capsules has been approved.     Effective dates: 09/27/2022 through 09/26/2023  Patients co-pay is $40.00.     Lyndel Safe, [place] Patient Advocate Specialist [place] Patient Advocate Team Direct Number: [phone]  Fax: [phone]

## 2022-09-27 NOTE — Evaluation (Signed)
Physical Therapy Evaluation Patient Details Name: Brian [name] MRN: [id number] DOB: 06/13/1966 Today's Date: 09/27/2022  History of Present Illness  Pt is 57 year old presented to [facility] on  09/24/22 with V-fib arrest and inferior STEMI. Pt 1 hour ACLS at [facility] Transferred to [facility] Pt PMH - HTN, DM, lumbar lam for epidural abscess  Clinical Impression  Pt doing well with mobility and will not need PT after DC. Will see one more time to practice stairs and then dc from PT assuming he does well with those.        Recommendations for follow up therapy are one component of a multi-disciplinary discharge planning process, led by the attending physician.  Recommendations may be updated based on patient status, additional functional criteria and insurance authorization.  Follow Up Recommendations No PT follow up      Assistance Recommended at Discharge None  Patient can return home with the following       Equipment Recommendations None recommended by PT  Recommendations for Other Services       Functional Status Assessment Patient has had a recent decline in their functional status and demonstrates the ability to make significant improvements in function in a reasonable and predictable amount of time.     Precautions / Restrictions        Mobility  Bed Mobility               General bed mobility comments: Pt up in chair    Transfers Overall transfer level: Modified independent Equipment used: None                    Ambulation/Gait Ambulation/Gait assistance: Supervision Gait Distance (Feet): 450 Feet Assistive device: None Gait Pattern/deviations: WFL(Within Functional Limits) Gait velocity: normal Gait velocity interpretation: >4.37 ft/sec, indicative of normal walking speed   General Gait Details: Assist for lines. Pt reports LLE feels "off" but no noticeable deviations  Stairs            Wheelchair Mobility    Modified Rankin (Stroke Patients  Only)       Balance Overall balance assessment: Independent                           High level balance activites: Direction changes, Turns High Level Balance Comments: Pt able to pick up object from floor unassisted. Tandem stance with min assist to achieve and min guard to maintain. [place] eyes open and closed without assist. 360 degree turn unassisted and less than 6 steps.             Pertinent Vitals/Pain Pain Assessment Pain Assessment: Faces Faces Pain Scale: Hurts little more Pain Location: chest with coughing Pain Descriptors / Indicators: Sore Pain Intervention(s): Monitored during session    Home Living Family/patient expects to be discharged to:: Private residence Living Arrangements: Spouse/significant other Available Help at Discharge: Family Type of Home: House Home Access: Stairs to enter   Technical brewer of Steps: 2 Alternate Level Stairs-Number of Steps: flight Home Layout: Two level;Able to live on main level with bedroom/bathroom;Bed/bath upstairs        Prior Function Prior Level of Function : Independent/Modified Independent;Working/employed;Driving                     Hand Dominance        Extremity/Trunk Assessment   Upper Extremity Assessment Upper Extremity Assessment: Defer to OT evaluation    Lower  Extremity Assessment Lower Extremity Assessment: LLE deficits/detail LLE Deficits / Details: Pt reports LLE feels "off" but denies sensory changes and strength is normal       Communication   Communication: No difficulties  Cognition Arousal/Alertness: Awake/alert Behavior During Therapy: WFL for tasks assessed/performed Overall Cognitive Status: Within Functional Limits for tasks assessed                                          General Comments General comments (skin integrity, edema, etc.): VSS on RA    Exercises     Assessment/Plan    PT Assessment Patient needs continued  PT services  PT Problem List Decreased mobility       PT Treatment Interventions Stair training;Gait training    PT Goals (Current goals can be found in the Care Plan section)  Acute Rehab PT Goals Patient Stated Goal: return to baseline PT Goal Formulation: With patient Time For Goal Achievement: 10/04/22 Potential to Achieve Goals: Good    Frequency Min 2X/week     Co-evaluation               AM-PAC PT "6 Clicks" Mobility  Outcome Measure Help needed turning from your back to your side while in a flat bed without using bedrails?: None Help needed moving from lying on your back to sitting on the side of a flat bed without using bedrails?: None Help needed moving to and from a bed to a chair (including a wheelchair)?: None Help needed standing up from a chair using your arms (e.g., wheelchair or bedside chair)?: None Help needed to walk in hospital room?: A Little Help needed climbing 3-5 steps with a railing? : A Little 6 Click Score: 22    End of Session   Activity Tolerance: Patient tolerated treatment well Patient left: in chair;with call bell/phone within reach;with family/visitor present Nurse Communication: Mobility status PT Visit Diagnosis: Other abnormalities of gait and mobility (R26.89)    Time: 1140-1159 PT Time Calculation (min) (ACUTE ONLY): 19 min   Charges:   PT Evaluation $PT Eval Low Complexity: 1 Low          [place] Office Marks 09/27/2022, 2:07 PM

## 2022-09-27 NOTE — TOC Benefit Eligibility Note (Signed)
Patient Teacher, English as a foreign language completed.    The patient is currently admitted and upon discharge could be taking Vascepa 1 g capsules.  Requires Prior Authorization  The patient is insured through [employer] [place], [place] Patient Advocate Specialist [place] Patient Advocate Team Direct Number: [phone]  Fax: [phone]

## 2022-09-27 NOTE — Progress Notes (Signed)
Pt received information and education about CRPII at [facility]. Pt would like to be referred here.   Christen Bame 09/27/2022 3:05 PM

## 2022-09-27 NOTE — Progress Notes (Signed)
   Heart Failure Stewardship Pharmacist Progress Note   PCP: Lawerance Cruel, MD PCP-Cardiologist: Jenkins Rouge, MD    HPI:  57 yo M with PMH of GERD, HLD, and T2DM.  Cardiac CT 02/25/20 Calcium score 252, 90th percentile with prox LAD with 50-60% plaque.  Presented to the ED on 1/27 with Vfib arrest. Possible aspiration during code. ROSC after about 50-60 mins of ACLS. Seizure like activity noted initially after ROSC obtained, intubated and sedated. Cath deferred with ST elevations minimized on repeat EKG. Was following all commands, extubated 1/28. Now off all pressors. ECHO 1/28 showed LVEF 30%, regional wall motion abnormalities, G1DD, RV moderately reduced. Taken for cath 1/29, found to have complete occlusion of RCA, moderate stenosis of LAD. Recommending medical management since onset was ~48 hours prior to cath.   Current HF Medications: Beta Blocker: carvedilol 6.25 mg BID ACE/ARB/ARNI: Entresto 24/26 mg BID SGLT2i: Farxiga 10 mg daily  Prior to admission HF Medications: None  Pertinent Lab Values: Serum creatinine 1.01, BUN 11, Potassium 4.0, Sodium 138, Magnesium 1.9, A1c 10.8, BNP 517.8  Vital Signs: Weight: 202 lbs (admission weight: 197 lbs) Blood pressure: 130-150/90s Heart rate: 90-110s   Medication Assistance / Insurance Benefits Check: Does the patient have prescription insurance?  Yes Type of insurance plan: BCBS commercial plan  Outpatient Pharmacy:  Prior to admission outpatient [facility] Is the patient willing to use [place] at discharge? Yes Is the patient willing to transition their outpatient pharmacy to utilize a [facility] outpatient pharmacy?   Pending    Assessment: 1. Acute HFrEF/ICM (LVEF 30%). NYHA class II symptoms. - Agree with starting carvedilol 6.25 mg BID - Agree with starting Entresto 24/26 mg BID - Consider starting MRA prior to discharge - Agree with starting Farxiga 10 mg daily    Plan: 1) Medication changes  recommended at this time: - Agree with changes  2) Patient assistance: - Farxiga copay $80 - copay card lowers to $0 per month - Entresto copay $80 - copay card lowers to $10 per fill  3)  Education  - To be completed prior to discharge  Kerby Nora, PharmD, BCPS Heart Failure Stewardship Pharmacist Phone [phone]

## 2022-09-27 NOTE — Inpatient Diabetes Management (Signed)
Inpatient Diabetes Program Recommendations  AACE/ADA: New Consensus Statement on Inpatient Glycemic Control (2015)  Target Ranges:  Prepandial:   less than 140 mg/dL      Peak postprandial:   less than 180 mg/dL (1-2 hours)      Critically ill patients:  140 - 180 mg/dL   Lab Results  Component Value Date   GLUCAP 199 (H) 09/27/2022   HGBA1C 10.8 (H) 09/24/2022    Review of Glycemic Control  Diabetes history: New diagnosis DM type 2  Current orders for Inpatient glycemic control:  Farxiga 10 mg Daily Levemir 15 units bid Novolog 2-6 units Q4 hours  A1c 10.8%  Spoke with pt and wife at bedside regarding A1c of 10.8% and new diagnosis of Diabetes.  Explained what an A1C is. Discussed basic pathophysiology of DM Type 2, basic home care, importance of checking CBGs and maintaining good CBG control to prevent long-term and short-term complications. Reviewed glucose and A1C goals. Reviewed signs and symptoms of hyperglycemia and hypoglycemia along with treatment for both. Discussed impact of nutrition, exercise, stress, sickness, and medications on diabetes control. Reviewed Living Well with diabetes booklet and encouraged patient to read through entire book. Informed patient that he maybe prescribed basal insulin in addition to an oral agent. Discussed basal insulin in detail (how to take it, when to take it). Asked patient to check his glucose at least 2 times per day (fasting and an evening check) and to keep a log book of glucose readings and insulin taken. Explained how the doctor he follows up with can use the log book to continue to make insulin adjustments if needed. Reviewed and demonstrated how to draw up and administer insulin with vial and syringe. Patient was able to successfully demonstrate how to operate insulin pen.  Patient verbalized understanding of information discussed and he states that he has no further questions at this time related to diabetes. RNs to provide ongoing basic  DM education at bedside with this patient and engage patient to actively check blood glucose and administer insulin injections.      Outpatient Recommendations: SGLT2 Basal insulin Insulin pen needles [id number] Continuous glucose meter (either Dexcom G7 or Freestyle Libre3)   Thanks, Tama Headings RN, MSN, BC-ADM Inpatient Diabetes Coordinator Team Pager [phone] (8a-5p)

## 2022-09-27 NOTE — Telephone Encounter (Signed)
Patient Advocate Encounter   Received notification that prior authorization for Vascepa 1GM capsules is required.   PA submitted on 09/27/2022 Key [id number] Status is pending       Lyndel Safe, [place] Patient Advocate Specialist [place] Patient Advocate Team Direct Number: [phone]  Fax: [phone]

## 2022-09-27 NOTE — Care Management (Addendum)
  Transition of Care [facility]) Screening Note   Patient Details  Name: Brian [name] Date of Birth: 1966-02-15   Transition of Care [facility]) CM/SW Contact:    Bethena Roys, RN Phone Number: 09/27/2022, 10:28 AM    Transition of Care Department [facility]) has reviewed the patient. Case Manager received notification of Life Vest for home. Clinicals to be faxed to [employer] and will await insurance authorization. We will continue to monitor patient advancement through interdisciplinary progression rounds. If new patient transition needs arise, please place a TOC consult.  09-27-22 [place]- Per Dr. Martinique patient can be fit tomorrow. Life Vest rep is aware. No further needs identified at this time.

## 2022-09-27 NOTE — Progress Notes (Signed)
PROGRESS NOTE                                                                                                                                                                                                             Patient Demographics:    Brian [name], is a 57 y.o. male, DOB - 1966/08/20, WFU:[id number]  Outpatient Primary MD for the patient is Brian Cruel, MD    LOS - 3  Admit date - 09/24/2022    Chief Complaint  Patient presents with   Cardiac Arrest       Brief Narrative (HPI from H&P)   57 y.o. male history of hypertension, type 2 diabetes presented to hospital with out of hospital V. tach cardiac arrest requiring 1 hour of CPR due to inferior STEMI.  Required brief intubation and pressor support with Levophed, he was admitted by PCCM, seen by cardiology, underwent left heart cath with recommendations for medical management, was stabilized and transferred to my care on 09/27/2022 on day 3 of his hospital stay.   Subjective:    Brian [name] today has, No headache, No chest pain, No abdominal pain - No Nausea, No new weakness tingling or numbness, no SOB   Assessment  & Plan :   Inferior STEMI causing V-fib cardiac arrest requiring 1 hour of CPR.  Brief intubation, seen by PCCM and cardiology underwent left heart cath with catheterization showing 100% occlusion of mid RCA.  There is moderate (50-60%) disease involving the proximal LAD and mild (20%) stenosis of the proximal LCx.  Cardiology aggressive medical management, patient now close to his baseline mentating well.  Defer management of CAD and CHF to cardiology team.  Currently on DAPT, statin, beta-blocker, Entresto for secondary prevention, advance activity.  PT OT.  Ischemic cardiomyopathy with chronic systolic heart failure.  Currently appears compensated, cardiology on board and managing, currently on combination of Coreg, Lisabeth Register, monitor  and adjust as needed.   Dyslipidemia.  On statin.  DM type II.  On insulin and sliding scale diabetic and dietary education ordered.  Lab Results  Component Value Date   HGBA1C 10.8 (H) 09/24/2022   CBG (last 3)  Recent Labs    09/26/22 2007 09/26/22 2326 09/27/22 0346  GLUCAP 127* 124* 102*        Condition - Fair  Family Communication  :  None  Code Status :  Full  Consults  :  PCCM, Cards  PUD Prophylaxis :    Procedures  :     TTE - 1. Left ventricular ejection fraction, by estimation, is 30%. The left ventricle has moderately decreased function. The left ventricle demonstrates regional wall motion abnormalities (see scoring diagram/findings for description). Left ventricular diastolic parameters are consistent with Grade I diastolic dysfunction (impaired relaxation).  2. Right ventricular systolic function is moderately reduced. The right ventricular size is mildly enlarged.  3. The mitral valve is normal in structure. Mild mitral valve regurgitation.  4. The aortic valve is tricuspid. Aortic valve regurgitation is not visualized. Aortic valve sclerosis is present, with no evidence of aortic valve stenosis  L.Cath -  Severe single-vessel coronary artery disease; culprit lesion for the patient's inferior STEMI and cardiac arrest is likely 100% occlusion of mid RCA.  There is moderate (50-60%) disease involving the proximal LAD and mild (20%) stenosis of the proximal LCx. Mildly elevated left ventricular filling pressure (LVEDP 20 mmHg). Recommendations: Continue aggressive medical therapy; defer PCI to occluded mid RCA given STEMI onset was ~48 hours ago and patient is without residual angina or shock. Dual antiplatelet therapy with aspirin and clopidogrel for 12 months. Escalate goal-directed medical therapy for acute HFrEF, as tolerated.      Disposition Plan  :    Status is: Inpatient   DVT Prophylaxis  :    enoxaparin (LOVENOX) injection 40 mg Start: 09/27/22  1000     Lab Results  Component Value Date   PLT 125 (L) 09/27/2022    Diet :  Diet Order             Diet heart healthy/carb modified Room service appropriate? Yes; Fluid consistency: Thin; Fluid restriction: 2000 mL Fluid  Diet effective now                    Inpatient Medications  Scheduled Meds:  acetaminophen  650 mg Oral Q6H   aspirin  81 mg Oral Daily   atorvastatin  80 mg Oral Daily   carvedilol  6.25 mg Oral BID WC   Chlorhexidine Gluconate Cloth  6 each Topical Daily   clopidogrel  75 mg Oral Q breakfast   dapagliflozin propanediol  10 mg Oral Daily   docusate  100 mg Oral BID   enoxaparin (LOVENOX) injection  40 mg Subcutaneous Q24H   famotidine  20 mg Oral BID   insulin aspart  2-6 Units Subcutaneous Q4H   insulin detemir  15 Units Subcutaneous Q12H   lidocaine  1 patch Transdermal Q24H   sacubitril-valsartan  1 tablet Oral BID   sodium chloride flush  3 mL Intravenous Q12H   Continuous Infusions:  sodium chloride Stopped (09/27/22 0353)   insulin Stopped (09/26/22 1136)   PRN Meds:.sodium chloride, benzonatate, dextrose, docusate, mouth rinse, mouth rinse, mouth rinse, mouth rinse, oxyCODONE, polyethylene glycol, sodium chloride flush  Antibiotics  :    Anti-infectives (From admission, onward)    Start     Dose/Rate Route Frequency Ordered Stop   09/25/22 1900  cefTRIAXone (ROCEPHIN) 2 g in sodium chloride 0.9 % 100 mL IVPB  Status:  Discontinued        2 g 200 mL/hr over 30 Minutes Intravenous Every 24 hours 09/25/22 1543 09/25/22 1649   09/25/22 0330  piperacillin-tazobactam (ZOSYN) IVPB 3.375 g  Status:  Discontinued        3.375 g 12.5 mL/hr over 240 Minutes  Intravenous Every 8 hours 09/25/22 0233 09/25/22 1543         Objective:   Vitals:   09/27/22 0200 09/27/22 0300 09/27/22 0400 09/27/22 0500  BP: 132/79 120/82 (!) 129/94 134/83  Pulse: (!) 102 (!) 104 (!) 109 (!) 106  Resp: 12 14 (!) 24 15  Temp:   98.2 F (36.8 C)    TempSrc:   Oral   SpO2: 91% 91% 94% 93%  Weight:   92 kg   Height:        Wt Readings from Last 3 Encounters:  09/27/22 92 kg  07/15/22 91.5 kg  08/17/20 94.3 kg     Intake/Output Summary (Last 24 hours) at 09/27/2022 0827 Last data filed at 09/27/2022 0400 Gross per 24 hour  Intake 1228.75 ml  Output 3601 ml  Net -2372.25 ml     Physical Exam  Awake Alert, No new F.N deficits, Normal affect, L IJ C [place].AT,PERRAL Supple Neck, No JVD,   Symmetrical Chest wall movement, Good air movement bilaterally, CTAB RRR,No Gallops,Rubs or new Murmurs,  +ve B.Sounds, Abd Soft, No tenderness,   No Cyanosis, Clubbing or edema       Data Review:    Recent Labs  Lab 09/24/22 1822 09/24/22 1921 09/24/22 2323 09/25/22 0210 09/26/22 0338 09/27/22 0403  WBC 9.9  --   --  23.6* 11.2* 8.7  HGB 15.9 13.6 14.3 15.0 11.8* 11.7*  HCT 44.4 40.0 42.0 43.5 34.3* 33.8*  PLT 160  --   --  202 142* 125*  MCV 86.7  --   --  90.4 89.6 90.9  MCH 31.1  --   --  31.2 30.8 31.5  MCHC 35.8  --   --  34.5 34.4 34.6  RDW 12.4  --   --  12.7 12.9 13.1  LYMPHSABS 3.6  --   --   --   --   --   MONOABS 0.7  --   --   --   --   --   EOSABS 0.2  --   --   --   --   --   BASOSABS 0.1  --   --   --   --   --     Recent Labs  Lab 09/24/22 1822 09/24/22 1921 09/24/22 2224 09/24/22 2323 09/25/22 0208 09/25/22 0210 09/25/22 0631 09/25/22 1010 09/25/22 1502 09/25/22 1651 09/26/22 0338 09/27/22 0403 09/27/22 0627  NA 129*   < >  --    < >  --  136 136 135 136  --  138 138  --   K 4.1   < >  --    < >  --  4.2 3.3* 3.3* 3.3*  --  3.7 4.0  --   CL 96*  --   --   --   --  100 98 98 95*  --  100 106  --   CO2 20*  --   --   --   --  18* 22 24 24   --  29 27  --   ANIONGAP 13  --   --   --   --  18* 16* 13 17*  --  9 5  --   GLUCOSE 321*  --   --   --   --  472* 303* 216* 263*  --  141* 99  --   BUN 15  --   --   --   --  21*  22* 23* 23*  --  12 11  --   CREATININE 1.04  --   --   --   --  1.82*  1.54* 1.38* 1.39*  --  1.16 1.01  --   AST 39  --   --   --   --   --   --   --   --   --   --   --   --   ALT 33  --   --   --   --   --   --   --   --   --   --   --   --   ALKPHOS 99  --   --   --   --   --   --   --   --   --   --   --   --   BILITOT 0.5  --   --   --   --   --   --   --   --   --   --   --   --   ALBUMIN 4.2  --   --   --   --   --   --   --   --   --   --   --   --   PROCALCITON  --   --   --   --   --   --   --   --   --  12.09  --   --   --   LATICACIDVEN  --   --  >9.0*  --  7.5*  --   --   --   --   --   --   --   --   HGBA1C  --   --  10.8*  --   --   --   --   --   --   --   --   --   --   MG  --    < > 2.9*  --   --  2.3 2.2 2.1 1.9  --   --   --  1.9  CALCIUM 8.8*  --   --   --   --  7.9* 8.0* 8.3* 8.3*  --  8.1* 8.7*  --    < > = values in this interval not displayed.   Recent Labs    09/26/22 0338  CHOL 107  HDL 18*  LDLCALC UNABLE TO CALCULATE IF TRIGLYCERIDE OVER 400 mg/dL  TRIG 428*  CHOLHDL 5.9  LDLDIRECT 21.9    Lab Results  Component Value Date   HGBA1C 10.8 (H) 09/24/2022    Radiology Reports CARDIAC CATHETERIZATION  Result Date: 09/26/2022 Conclusions: Severe single-vessel coronary artery disease; culprit lesion for the patient's inferior STEMI and cardiac arrest is likely 100% occlusion of mid RCA.  There is moderate (50-60%) disease involving the proximal LAD and mild (20%) stenosis of the proximal LCx. Mildly elevated left ventricular filling pressure (LVEDP 20 mmHg). Recommendations: Continue aggressive medical therapy; defer PCI to occluded mid RCA given STEMI onset was ~48 hours ago and patient is without residual angina or shock. Dual antiplatelet therapy with aspirin and clopidogrel for 12 months. Escalate goal-directed medical therapy for acute HFrEF, as tolerated. Nelva Bush, MD [facility]  ECHOCARDIOGRAM COMPLETE  Result Date: 09/25/2022    ECHOCARDIOGRAM REPORT   Patient Name:   SHALAMAR CRAYS Date of  Exam: 09/25/2022  Medical Rec #:  [id number]        Height:       70.0 in Accession #:    [id number]       Weight:       204.8 lb Date of Birth:  1966/08/08         BSA:          2.108 m Patient Age:    56 years         BP:           97/77 mmHg Patient Gender: M                HR:           88 bpm. Exam Location:  Inpatient Procedure: 2D Echo, Cardiac Doppler, Color Doppler and Intracardiac            Opacification Agent STAT ECHO Indications:    Cardiac arrest I46.9  History:        Patient has no prior history of Echocardiogram examinations.                 Risk Factors:GERD and Dyslipidemia.  Sonographer:    Eulah Pont RDCS Referring Phys: [id number] Beulah Gandy PAYNE IMPRESSIONS  1. Left ventricular ejection fraction, by estimation, is 30%. The left ventricle has moderately decreased function. The left ventricle demonstrates regional wall motion abnormalities (see scoring diagram/findings for description). Left ventricular diastolic parameters are consistent with Grade I diastolic dysfunction (impaired relaxation).  2. Right ventricular systolic function is moderately reduced. The right ventricular size is mildly enlarged.  3. The mitral valve is normal in structure. Mild mitral valve regurgitation.  4. The aortic valve is tricuspid. Aortic valve regurgitation is not visualized. Aortic valve sclerosis is present, with no evidence of aortic valve stenosis. FINDINGS  Left Ventricle: Left ventricular ejection fraction, by estimation, is 30%. The left ventricle has moderately decreased function. The left ventricle demonstrates regional wall motion abnormalities. Definity contrast agent was given IV to delineate the left ventricular endocardial borders. The left ventricular internal cavity size was normal in size. There is no left ventricular hypertrophy. Left ventricular diastolic parameters are consistent with Grade I diastolic dysfunction (impaired relaxation). Normal left ventricular filling pressure.  LV Wall Scoring: The entire  inferior wall, posterior wall, mid inferoseptal segment, and basal inferoseptal segment are hypokinetic. Right Ventricle: The right ventricular size is mildly enlarged. No increase in right ventricular wall thickness. Right ventricular systolic function is moderately reduced. The tricuspid regurgitant velocity is 1.92 m/s, and with an assumed right atrial pressure of 15 mmHg, the estimated right ventricular systolic pressure is 29.7 mmHg. Left Atrium: Left atrial size was normal in size. Right Atrium: Right atrial size was normal in size. Pericardium: There is no evidence of pericardial effusion. Mitral Valve: The mitral valve is normal in structure. Mild mitral valve regurgitation. Tricuspid Valve: The tricuspid valve is normal in structure. Tricuspid valve regurgitation is trivial. Aortic Valve: The aortic valve is tricuspid. Aortic valve regurgitation is not visualized. Aortic valve sclerosis is present, with no evidence of aortic valve stenosis. Pulmonic Valve: The pulmonic valve was grossly normal. Pulmonic valve regurgitation is not visualized. Aorta: The aortic root and ascending aorta are structurally normal, with no evidence of dilitation. Venous: IVC assessment for right atrial pressure unable to be performed due to mechanical ventilation. IAS/Shunts: No atrial level shunt detected by color flow Doppler.  LEFT VENTRICLE PLAX 2D LVIDd:  4.80 cm     Diastology LVIDs:         4.20 cm     LV e' medial:    4.62 cm/s LV PW:         1.00 cm     LV E/e' medial:  7.9 LV IVS:        1.10 cm     LV e' lateral:   5.68 cm/s LVOT diam:     2.10 cm     LV E/e' lateral: 6.4 LV SV:         35 LV SV Index:   16 LVOT Area:     3.46 cm  LV Volumes (MOD) LV vol d, MOD A2C: 77.3 ml LV vol d, MOD A4C: 92.9 ml LV vol s, MOD A2C: 50.5 ml LV vol s, MOD A4C: 60.3 ml LV SV MOD A2C:     26.8 ml LV SV MOD A4C:     92.9 ml LV SV MOD BP:      29.9 ml RIGHT VENTRICLE RV S prime:     5.56 cm/s TAPSE (M-mode): 0.9 cm LEFT ATRIUM              Index        RIGHT ATRIUM           Index LA diam:        3.40 cm 1.61 cm/m   RA Area:     18.30 cm LA Vol (A2C):   35.4 ml 16.79 ml/m  RA Volume:   50.10 ml  23.76 ml/m LA Vol (A4C):   31.9 ml 15.13 ml/m LA Biplane Vol: 36.8 ml 17.45 ml/m  AORTIC VALVE LVOT Vmax:   67.70 cm/s LVOT Vmean:  48.550 cm/s LVOT VTI:    0.100 m  AORTA Ao Root diam: 3.00 cm Ao Asc diam:  3.50 cm MITRAL VALVE               TRICUSPID VALVE MV Area (PHT): 2.09 cm    TR Peak grad:   14.7 mmHg MV Decel Time: 363 msec    TR Vmax:        192.00 cm/s MV E velocity: 36.40 cm/s MV A velocity: 41.00 cm/s  SHUNTS MV E/A ratio:  0.89        Systemic VTI:  0.10 m                            Systemic Diam: 2.10 cm Rachelle Hora Croitoru MD Electronically signed by Thurmon Fair MD Signature Date/Time: 09/25/2022/10:44:57 AM    Final    CT HEAD WO CONTRAST ( )  Result Date: 09/25/2022 CLINICAL DATA:  Initial evaluation for altered mental status. EXAM: CT HEAD WITHOUT CONTRAST TECHNIQUE: Contiguous axial images were obtained from the base of the skull through the vertex without intravenous contrast. RADIATION DOSE REDUCTION: This exam was performed according to the departmental dose-optimization program which includes automated exposure control, adjustment of the mA and/or kV according to patient size and/or use of iterative reconstruction technique. COMPARISON:  None Available. FINDINGS: Brain: Cerebral volume within normal limits. No acute intracranial hemorrhage. No acute large vessel territory infarct. No mass lesion or midline shift. No hydrocephalus or extra-axial fluid collection. Vascular: No convincing abnormal hyperdense vessel. Skull: Scalp soft tissues and calvarium demonstrate no acute finding. Sinuses/Orbits: Globes orbital soft tissues within normal limits. Trace layering secretions noted within the sphenoid sinuses. Mastoid air cells are clear. Patient is intubated.  Other: None. IMPRESSION: Negative head CT. No acute  intracranial abnormality identified. Electronically Signed   By: Rise Mu M.D.   On: 09/25/2022 01:24   DG CHEST PORT 1 VIEW  Result Date: 09/24/2022 CLINICAL DATA:  Central line and endotracheal tube placement. EXAM: PORTABLE CHEST 1 VIEW COMPARISON:  Radiograph earlier today. FINDINGS: New left internal jugular central venous catheter tip overlies the upper SVC. No pneumothorax. Endotracheal tube tip is 5.8 cm from the carina. Enteric tube in place with tip below the diaphragm not included in this chest field of view. Lung volumes are low. Stable heart size and mediastinal contours. No developing airspace disease. IMPRESSION: 1. New left internal jugular central venous catheter tip overlies the upper SVC. No pneumothorax. 2. Endotracheal tube tip 5.8 cm from the carina. Enteric tube in place. 3. Low lung volumes. Electronically Signed   By: Narda Rutherford M.D.   On: 09/24/2022 23:49   DG Abd 1 View  Result Date: 09/24/2022 CLINICAL DATA:  Orogastric tube placement. EXAM: ABDOMEN - 1 VIEW COMPARISON:  None Available. FINDINGS: Tip and side port of the enteric tube below the diaphragm in the stomach. Nonobstructive upper abdominal bowel gas pattern. Moderate volume of stool in the colon. IMPRESSION: Tip and side port of the enteric tube below the diaphragm in the stomach. Electronically Signed   By: Narda Rutherford M.D.   On: 09/24/2022 23:47   DG Chest Portable 1 View  Result Date: 09/24/2022 CLINICAL DATA:  Cardiac arrest. EXAM: PORTABLE CHEST 1 VIEW COMPARISON:  None Available. FINDINGS: Endotracheal tube tip is approximately 3.5 cm from the carina. Enteric tube is below the diaphragm not included in the field of view. Lung volumes are low. Heart size upper normal. Grossly normal mediastinal contours for technique. No confluent airspace disease, large pleural effusion or visible pneumothorax. Right humeral head arthroplasty. Question postsurgical change of the left acromioclavicular  joint. No grossly displaced rib fractures are seen. IMPRESSION: 1. Endotracheal tube tip approximately 3.5 cm from the carina. Enteric tube is below the diaphragm not included in the field of view. 2. Low lung volumes. Electronically Signed   By: Narda Rutherford M.D.   On: 09/24/2022 20:26      Signature  -   Susa Raring M.D on 09/27/2022 at 8:27 AM   -  To page go to [email or web address]

## 2022-09-27 NOTE — Progress Notes (Signed)
    Patient meets criteria for lifevest at discharge. Orders placed. Rep notified.   Echo: 09/25/22  IMPRESSIONS     1. Left ventricular ejection fraction, by estimation, is 30%. The left  ventricle has moderately decreased function. The left ventricle  demonstrates regional wall motion abnormalities (see scoring  diagram/findings for description). Left ventricular  diastolic parameters are consistent with Grade I diastolic dysfunction  (impaired relaxation).   2. Right ventricular systolic function is moderately reduced. The right  ventricular size is mildly enlarged.   3. The mitral valve is normal in structure. Mild mitral valve  regurgitation.   4. The aortic valve is tricuspid. Aortic valve regurgitation is not  visualized. Aortic valve sclerosis is present, with no evidence of aortic  valve stenosis.   FINDINGS   Left Ventricle: Left ventricular ejection fraction, by estimation, is  30%. The left ventricle has moderately decreased function. The left  ventricle demonstrates regional wall motion abnormalities. Definity  contrast agent was given IV to delineate the  left ventricular endocardial borders. The left ventricular internal cavity  size was normal in size. There is no left ventricular hypertrophy. Left  ventricular diastolic parameters are consistent with Grade I diastolic  dysfunction (impaired relaxation).  Normal left ventricular filling pressure.     LV Wall Scoring:  The entire inferior wall, posterior wall, mid inferoseptal segment, and  basal  inferoseptal segment are hypokinetic.   Right Ventricle: The right ventricular size is mildly enlarged. No  increase in right ventricular wall thickness. Right ventricular systolic  function is moderately reduced. The tricuspid regurgitant velocity is 1.92  m/s, and with an assumed right atrial  pressure of 15 mmHg, the estimated right ventricular systolic pressure is  37.1 mmHg.   Left Atrium: Left atrial size  was normal in size.   Right Atrium: Right atrial size was normal in size.   Pericardium: There is no evidence of pericardial effusion.   Mitral Valve: The mitral valve is normal in structure. Mild mitral valve  regurgitation.   Tricuspid Valve: The tricuspid valve is normal in structure. Tricuspid  valve regurgitation is trivial.   Aortic Valve: The aortic valve is tricuspid. Aortic valve regurgitation is  not visualized. Aortic valve sclerosis is present, with no evidence of  aortic valve stenosis.   Pulmonic Valve: The pulmonic valve was grossly normal. Pulmonic valve  regurgitation is not visualized.   Aorta: The aortic root and ascending aorta are structurally normal, with  no evidence of dilitation.   Venous: IVC assessment for right atrial pressure unable to be performed  due to mechanical ventilation.   IAS/Shunts: No atrial level shunt detected by color flow Doppler.   Cath: 09/26/22  Conclusions: Severe single-vessel coronary artery disease; culprit lesion for the patient's inferior STEMI and cardiac arrest is likely 100% occlusion of mid RCA.  There is moderate (50-60%) disease involving the proximal LAD and mild (20%) stenosis of the proximal LCx. Mildly elevated left ventricular filling pressure (LVEDP 20 mmHg).   Recommendations: Continue aggressive medical therapy; defer PCI to occluded mid RCA given STEMI onset was ~48 hours ago and patient is without residual angina or shock. Dual antiplatelet therapy with aspirin and clopidogrel for 12 months. Escalate goal-directed medical therapy for acute HFrEF, as tolerated.   Nelva Bush, MD [facility]   Signed, Reino Bellis, NP-C 09/27/2022, 10:23 AM Pager: [phone]

## 2022-09-28 ENCOUNTER — Other Ambulatory Visit (HOSPITAL_COMMUNITY): Payer: Self-pay

## 2022-09-28 ENCOUNTER — Encounter (HOSPITAL_COMMUNITY): Payer: Self-pay | Admitting: Critical Care Medicine

## 2022-09-28 DIAGNOSIS — I5021 Acute systolic (congestive) heart failure: Secondary | ICD-10-CM | POA: Diagnosis not present

## 2022-09-28 DIAGNOSIS — I2111 ST elevation (STEMI) myocardial infarction involving right coronary artery: Secondary | ICD-10-CM | POA: Diagnosis not present

## 2022-09-28 DIAGNOSIS — I469 Cardiac arrest, cause unspecified: Secondary | ICD-10-CM | POA: Diagnosis not present

## 2022-09-28 LAB — GLUCOSE, CAPILLARY
Glucose-Capillary: 133 mg/dL — ABNORMAL HIGH (ref 70–99)
Glucose-Capillary: 81 mg/dL (ref 70–99)
Glucose-Capillary: 88 mg/dL (ref 70–99)
Glucose-Capillary: 96 mg/dL (ref 70–99)

## 2022-09-28 LAB — MAGNESIUM: Magnesium: 1.7 mg/dL (ref 1.7–2.4)

## 2022-09-28 LAB — CBC WITH DIFFERENTIAL/PLATELET
Abs Immature Granulocytes: 0.04 10*3/uL (ref 0.00–0.07)
Basophils Absolute: 0 10*3/uL (ref 0.0–0.1)
Basophils Relative: 0 %
Eosinophils Absolute: 0.4 10*3/uL (ref 0.0–0.5)
Eosinophils Relative: 4 %
HCT: 35.4 % — ABNORMAL LOW (ref 39.0–52.0)
Hemoglobin: 12.5 g/dL — ABNORMAL LOW (ref 13.0–17.0)
Immature Granulocytes: 1 %
Lymphocytes Relative: 17 %
Lymphs Abs: 1.5 10*3/uL (ref 0.7–4.0)
MCH: 31.2 pg (ref 26.0–34.0)
MCHC: 35.3 g/dL (ref 30.0–36.0)
MCV: 88.3 fL (ref 80.0–100.0)
Monocytes Absolute: 0.8 10*3/uL (ref 0.1–1.0)
Monocytes Relative: 9 %
Neutro Abs: 6.1 10*3/uL (ref 1.7–7.7)
Neutrophils Relative %: 69 %
Platelets: 159 10*3/uL (ref 150–400)
RBC: 4.01 MIL/uL — ABNORMAL LOW (ref 4.22–5.81)
RDW: 12.7 % (ref 11.5–15.5)
WBC: 8.9 10*3/uL (ref 4.0–10.5)
nRBC: 0 % (ref 0.0–0.2)

## 2022-09-28 LAB — COMPREHENSIVE METABOLIC PANEL
ALT: 68 U/L — ABNORMAL HIGH (ref 0–44)
AST: 102 U/L — ABNORMAL HIGH (ref 15–41)
Albumin: 2.8 g/dL — ABNORMAL LOW (ref 3.5–5.0)
Alkaline Phosphatase: 68 U/L (ref 38–126)
Anion gap: 15 (ref 5–15)
BUN: 17 mg/dL (ref 6–20)
CO2: 17 mmol/L — ABNORMAL LOW (ref 22–32)
Calcium: 8.7 mg/dL — ABNORMAL LOW (ref 8.9–10.3)
Chloride: 102 mmol/L (ref 98–111)
Creatinine, Ser: 0.98 mg/dL (ref 0.61–1.24)
GFR, Estimated: >60 mL/min (ref >60)
Glucose, Bld: 108 mg/dL — ABNORMAL HIGH (ref 70–99)
Potassium: 3.3 mmol/L — ABNORMAL LOW (ref 3.5–5.1)
Sodium: 134 mmol/L — ABNORMAL LOW (ref 135–145)
Total Bilirubin: 1.7 mg/dL — ABNORMAL HIGH (ref 0.3–1.2)
Total Protein: 6.3 g/dL — ABNORMAL LOW (ref 6.5–8.1)

## 2022-09-28 LAB — LIPOPROTEIN A (LPA): Lipoprotein (a): 29.4 nmol/L (ref <75.0)

## 2022-09-28 LAB — BRAIN NATRIURETIC PEPTIDE: B Natriuretic Peptide: 266.5 pg/mL — ABNORMAL HIGH (ref 0.0–100.0)

## 2022-09-28 MED ORDER — BLOOD PRESSURE KIT: 1.0000 [IU] | PACK | Freq: Every day | 0 refills | Status: AC | PRN

## 2022-09-28 MED ORDER — INSULIN ASPART 100 UNIT/ML FLEXPEN
PEN_INJECTOR | SUBCUTANEOUS | 0 refills | Status: DC
  Filled 2022-09-28: qty 9, 30d supply, fill #0

## 2022-09-28 MED ORDER — GLUCOSE BLOOD VI STRP
1.0000 | ORAL_STRIP | Freq: Three times a day (TID) | 0 refills | Status: AC
  Filled 2022-09-28: qty 100, 34d supply, fill #0

## 2022-09-28 MED ORDER — INSULIN DETEMIR 100 UNIT/ML FLEXPEN
15.0000 [IU] | PEN_INJECTOR | Freq: Two times a day (BID) | SUBCUTANEOUS | 0 refills | Status: DC
  Filled 2022-09-28: qty 9, 30d supply, fill #0

## 2022-09-28 MED ORDER — ACCU-CHEK GUIDE W/DEVICE KIT
1.0000 | PACK | Freq: Three times a day (TID) | 0 refills | Status: AC
  Filled 2022-09-28: qty 1, 30d supply, fill #0

## 2022-09-28 MED ORDER — SPIRONOLACTONE 25 MG PO TABS
25.0000 mg | ORAL_TABLET | Freq: Every day | ORAL | 0 refills | Status: DC
  Filled 2022-09-28: qty 30, 30d supply, fill #0

## 2022-09-28 MED ORDER — FAMOTIDINE 20 MG PO TABS
20.0000 mg | ORAL_TABLET | Freq: Two times a day (BID) | ORAL | 0 refills | Status: DC
  Filled 2022-09-28: qty 30, 15d supply, fill #0

## 2022-09-28 MED ORDER — CARVEDILOL 12.5 MG PO TABS
12.5000 mg | ORAL_TABLET | Freq: Two times a day (BID) | ORAL | 2 refills | Status: DC
  Filled 2022-09-28: qty 60, 30d supply, fill #0

## 2022-09-28 MED ORDER — SPIRONOLACTONE 25 MG PO TABS
25.0000 mg | ORAL_TABLET | Freq: Every day | ORAL | Status: DC
  Administered 2022-09-28: 25 mg via ORAL
  Filled 2022-09-28: qty 1

## 2022-09-28 MED ORDER — ACCU-CHEK SOFTCLIX LANCETS MISC
1.0000 | Freq: Three times a day (TID) | 0 refills | Status: AC
  Filled 2022-09-28: qty 100, 33d supply, fill #0

## 2022-09-28 MED ORDER — SACUBITRIL-VALSARTAN 24-26 MG PO TABS
1.0000 | ORAL_TABLET | Freq: Two times a day (BID) | ORAL | 0 refills | Status: DC
  Filled 2022-09-28: qty 60, 30d supply, fill #0

## 2022-09-28 MED ORDER — ASPIRIN 81 MG PO CHEW
81.0000 mg | CHEWABLE_TABLET | Freq: Every day | ORAL | 2 refills | Status: AC
  Filled 2022-09-28: qty 30, 30d supply, fill #0

## 2022-09-28 MED ORDER — INSULIN PEN NEEDLE 32G X 4 MM MISC
0 refills | Status: DC
  Filled 2022-09-28: qty 100, 20d supply, fill #0

## 2022-09-28 MED ORDER — POTASSIUM CHLORIDE CRYS ER 20 MEQ PO TBCR
40.0000 meq | EXTENDED_RELEASE_TABLET | Freq: Once | ORAL | Status: AC
  Administered 2022-09-28: 40 meq via ORAL
  Filled 2022-09-28: qty 2

## 2022-09-28 MED ORDER — ATORVASTATIN CALCIUM 80 MG PO TABS
80.0000 mg | ORAL_TABLET | Freq: Every day | ORAL | 2 refills | Status: DC
  Filled 2022-09-28: qty 30, 30d supply, fill #0

## 2022-09-28 MED ORDER — MAGNESIUM SULFATE 2 GM/50ML IV SOLN
2.0000 g | Freq: Once | INTRAVENOUS | Status: AC
  Administered 2022-09-28: 2 g via INTRAVENOUS
  Filled 2022-09-28: qty 50

## 2022-09-28 MED ORDER — ACETAMINOPHEN 325 MG PO TABS
650.0000 mg | ORAL_TABLET | Freq: Four times a day (QID) | ORAL | 0 refills | Status: AC | PRN
  Filled 2022-09-28: qty 20, 3d supply, fill #0

## 2022-09-28 MED ORDER — CLOPIDOGREL BISULFATE 75 MG PO TABS
75.0000 mg | ORAL_TABLET | Freq: Every day | ORAL | 2 refills | Status: DC
  Filled 2022-09-28: qty 30, 30d supply, fill #0

## 2022-09-28 MED ORDER — CARVEDILOL 12.5 MG PO TABS
12.5000 mg | ORAL_TABLET | Freq: Two times a day (BID) | ORAL | Status: DC
  Administered 2022-09-28: 12.5 mg via ORAL
  Filled 2022-09-28: qty 1

## 2022-09-28 MED ORDER — ACCU-CHEK FASTCLIX LANCET KIT
1.0000 | PACK | Freq: Three times a day (TID) | 0 refills | Status: AC
  Filled 2022-09-28: qty 1, 30d supply, fill #0

## 2022-09-28 MED ORDER — DAPAGLIFLOZIN PROPANEDIOL 10 MG PO TABS
10.0000 mg | ORAL_TABLET | Freq: Every day | ORAL | 0 refills | Status: DC
  Filled 2022-09-28: qty 30, 30d supply, fill #0

## 2022-09-28 NOTE — Progress Notes (Signed)
This RN provided discharge instructions to patient and wife Joziah Dollins). All questions answered. PIVs removed. This RN discharged patient via wheelchair to main entrance. Patient transferred to vehicle safely.

## 2022-09-28 NOTE — Progress Notes (Signed)
   Heart Failure Stewardship Pharmacist Progress Note   PCP: Lawerance Cruel, MD PCP-Cardiologist: Jenkins Rouge, MD    HPI:  57 yo M with PMH of GERD, HLD, and T2DM.  Cardiac CT 02/25/20 Calcium score 252, 90th percentile with prox LAD with 50-60% plaque.  Presented to the ED on 1/27 with Vfib arrest. Possible aspiration during code. ROSC after about 50-60 mins of ACLS. Seizure like activity noted initially after ROSC obtained, intubated and sedated. Cath deferred with ST elevations minimized on repeat EKG. Was following all commands, extubated 1/28. Now off all pressors. ECHO 1/28 showed LVEF 30%, regional wall motion abnormalities, G1DD, RV moderately reduced. Taken for cath 1/29, found to have complete occlusion of RCA, moderate stenosis of LAD. Recommending medical management since onset was ~48 hours prior to cath.   Current HF Medications: Beta Blocker: carvedilol 12.5 mg BID ACE/ARB/ARNI: Entresto 24/26 mg BID MRA: spironolactone 25 mg daily SGLT2i: Farxiga 10 mg daily  Prior to admission HF Medications: None  Pertinent Lab Values: Serum creatinine 0.98, BUN 17, Potassium 3.3, Sodium 134, Magnesium 1.7, A1c 10.8, BNP 517.8  Vital Signs: Weight: 196 lbs (admission weight: 197 lbs) Blood pressure: 130-140/90s Heart rate: 90s   Medication Assistance / Insurance Benefits Check: Does the patient have prescription insurance?  Yes Type of insurance plan: BCBS commercial plan  Outpatient Pharmacy:  Prior to admission outpatient pharmacy: [facility] Is the patient willing to use [facility] at discharge? Yes Is the patient willing to transition their outpatient pharmacy to utilize a [facility] outpatient pharmacy?   Pending    Assessment: 1. Acute HFrEF/ICM (LVEF 30%). NYHA class II symptoms. - Agree with increasing to carvedilol 12.5 mg BID - Continue Entresto 24/26 mg BID - Agree with starting spironolactone 25 mg daily - Continue Farxiga 10 mg daily    Plan: 1)  Medication changes recommended at this time: - Agree with changes  2) Patient assistance: - Farxiga copay $80 - copay card lowers to $0 per month - Entresto copay $80 - copay card lowers to $10 per fill  3)  Education  - Patient has been educated on current HF medications and potential additions to HF medication regimen - Patient verbalizes understanding that over the next few months, these medication doses may change and more medications may be added to optimize HF regimen - Patient has been educated on basic disease state pathophysiology and goals of therapy   Kerby Nora, PharmD, BCPS Heart Failure Stewardship Pharmacist Phone [phone]

## 2022-09-28 NOTE — Inpatient Diabetes Management (Signed)
Inpatient Diabetes Program Recommendations  AACE/ADA: New Consensus Statement on Inpatient Glycemic Control (2015)  Target Ranges:  Prepandial:   less than 140 mg/dL      Peak postprandial:   less than 180 mg/dL (1-2 hours)      Critically ill patients:  140 - 180 mg/dL   Lab Results  Component Value Date   GLUCAP 133 (H) 09/28/2022   HGBA1C 10.8 (H) 09/24/2022    Spoke with pt at bedside and applied a dexcom G7 sensor to the right arm. The reader was set up and home insulin regimen was reviewed again with pt. Showed pt and family how to use prescribed glucometer at bedside. Pt and family do not have any further questions at this time.  Thanks,  Tama Headings RN, MSN, BC-ADM Inpatient Diabetes Coordinator Team Pager [phone] (8a-5p)

## 2022-09-28 NOTE — Progress Notes (Addendum)
Physical Therapy Treatment Patient Details Name: Brian [name] MRN: [id number] DOB: Jan 16, 1966 Today's Date: 09/28/2022   History of Present Illness Pt is 57 year old presented to [facility] on  09/24/22 with V-fib arrest and inferior STEMI. Pt 1 hour ACLS at [facility]. Transferred to [facility]. Pt PMH - HTN, DM, lumbar lam for epidural abscess    PT Comments    Pt independent with mobility including flight of stairs. Ready for DC. No further PT needed.    Recommendations for follow up therapy are one component of a multi-disciplinary discharge planning process, led by the attending physician.  Recommendations may be updated based on patient status, additional functional criteria and insurance authorization.  Follow Up Recommendations  No PT follow up     Assistance Recommended at Discharge None  Patient can return home with the following     Equipment Recommendations  None recommended by PT    Recommendations for Other Services       Precautions / Restrictions Precautions Precautions: None     Mobility  Bed Mobility Overal bed mobility: Needs Assistance Bed Mobility: Supine to Sit     Supine to sit: Min assist     General bed mobility comments: Assist for pt to pull up on my hand due to soreness in chest.    Transfers Overall transfer level: Independent Equipment used: None                    Ambulation/Gait Ambulation/Gait assistance: Independent Gait Distance (Feet): 150 Feet Assistive device: None Gait Pattern/deviations: WFL(Within Functional Limits) Gait velocity: normal Gait velocity interpretation: >4.37 ft/sec, indicative of normal walking speed   General Gait Details: Pt reports LLE feels much more normal than yesterday   Stairs Stairs: Yes Stairs assistance: Modified independent (Device/Increase time) Stair Management: One rail Right, Alternating pattern, Forwards Number of Stairs: 15 General stair comments: Steady on stairs   Wheelchair  Mobility    Modified Rankin (Stroke Patients Only)       Balance Overall balance assessment: Independent, No apparent balance deficits (not formally assessed)                                          Cognition Arousal/Alertness: Awake/alert Behavior During Therapy: WFL for tasks assessed/performed Overall Cognitive Status: Within Functional Limits for tasks assessed                                          Exercises      General Comments General comments (skin integrity, edema, etc.): VSS on RA      Pertinent Vitals/Pain Pain Assessment Pain Assessment: Faces Faces Pain Scale: Hurts little more Pain Location: chest Pain Descriptors / Indicators: Sore    Home Living                          Prior Function            PT Goals (current goals can now be found in the care plan section) Acute Rehab PT Goals Patient Stated Goal: return to baseline Progress towards PT goals: Goals met/education completed, patient discharged from PT    Frequency           PT Plan Current plan remains appropriate  Co-evaluation              AM-PAC PT "6 Clicks" Mobility   Outcome Measure  Help needed turning from your back to your side while in a flat bed without using bedrails?: None Help needed moving from lying on your back to sitting on the side of a flat bed without using bedrails?: None Help needed moving to and from a bed to a chair (including a wheelchair)?: None Help needed standing up from a chair using your arms (e.g., wheelchair or bedside chair)?: None Help needed to walk in hospital room?: None Help needed climbing 3-5 steps with a railing? : None 6 Click Score: 24    End of Session   Activity Tolerance: Patient tolerated treatment well Patient left: with call bell/phone within reach;with family/visitor present;in bed   PT Visit Diagnosis: Other abnormalities of gait and mobility (R26.89)     Time:  8127-5170 PT Time Calculation (min) (ACUTE ONLY): 8 min  Charges:  $Gait Training: 8-22 mins                     [place] [facility] [place] 09/28/2022, 1:38 PM

## 2022-09-28 NOTE — Progress Notes (Signed)
Pt has been ambulating without AD, no c/o. Also PT to do stairs next. Discussed with pt and wife STEMI, restrictions, HF management, low sodium diet, carb counting, exercise, NTG and CRPII. Very receptive. Will refer to [facility].  [id number] Yves Dill BS, ACSM-CEP 09/28/2022 11:02 AM

## 2022-09-28 NOTE — Discharge Instructions (Signed)
Follow with Primary MD Lawerance Cruel, MD in 3-5 days   Get CBC, CMP, Magnesium -  checked next visit with your primary MD   Activity: As tolerated with Full fall precautions use walker/cane & assistance as needed  Disposition Home   Diet: Heart Healthy Low Carb  Accuchecks 4 times/day, Once in AM empty stomach and then before each meal. Log in all results and show them to your Prim.MD in 3 days. If any glucose reading is under 80 or above 300 call your Prim MD immidiately. Follow Low glucose instructions for glucose under 80 as instructed.   Special Instructions: If you have smoked or chewed Tobacco  in the last 2 yrs please stop smoking, stop any regular Alcohol  and or any Recreational drug use.  On your next visit with your primary care physician please Get Medicines reviewed and adjusted.  Please request your Prim.MD to go over all Hospital Tests and Procedure/Radiological results at the follow up, please get all Hospital records sent to your Prim MD by signing hospital release before you go home.  If you experience worsening of your admission symptoms, develop shortness of breath, life threatening emergency, suicidal or homicidal thoughts you must seek medical attention immediately by calling 911 or calling your MD immediately  if symptoms less severe.  You Must read complete instructions/literature along with all the possible adverse reactions/side effects for all the Medicines you take and that have been prescribed to you. Take any new Medicines after you have completely understood and accpet all the possible adverse reactions/side effects.

## 2022-09-28 NOTE — Discharge Summary (Signed)
Brian [name] EPP:[id number] DOB: 05-03-1966 DOA: 09/24/2022  PCP: Lawerance Cruel, MD  Admit date: 09/24/2022  Discharge date: 09/28/2022  Admitted From: Home   Disposition:  Home   Recommendations for Outpatient Follow-up:   Follow up with PCP in 1-2 weeks  PCP Please obtain BMP/CBC, 2 view CXR in 1week,  (see Discharge instructions)   PCP Please follow up on the following pending results: Kindly monitor blood pressure and BMP along with magnesium closely.  Needs close outpatient cardiology follow-up   Home Health: None   Equipment/Devices: None  Consultations: Cards Discharge Condition: Stable    CODE STATUS: Full    Diet Recommendation: Heart Healthy   Diet Order             Diet heart healthy/carb modified Room service appropriate? Yes; Fluid consistency: Thin; Fluid restriction: 2000 mL Fluid  Diet effective now                    Chief Complaint  Patient presents with   Cardiac Arrest     Brief history of present illness from the day of admission and additional interim summary    57 y.o. male history of hypertension, type 2 diabetes presented to hospital with out of hospital V. tach cardiac arrest requiring 1 hour of CPR due to inferior STEMI.  Required brief intubation and pressor support with Levophed, he was admitted by PCCM, seen by cardiology, underwent left heart cath with recommendations for medical management, was stabilized and transferred to my care on 09/27/2022 on day 3 of his hospital stay.                                                                  Hospital Course   Inferior STEMI causing V-fib cardiac arrest requiring 1 hour of CPR.  Brief intubation, seen by PCCM and cardiology underwent left heart cath with catheterization showing 100% occlusion of mid RCA.  There is  moderate (50-60%) disease involving the proximal LAD and mild (20%) stenosis of the proximal LCx.  Cardiology aggressive medical management, patient now close to his baseline mentating well.  Defer management of CAD and CHF to cardiology team.  Currently on DAPT, statin, beta-blocker, Lisabeth Register for secondary prevention, [place] symptom-free cleared by cardiology to be discharged with close outpatient follow-up with PCP and cardiology.   Ischemic cardiomyopathy with chronic systolic heart failure.  Currently appears compensated, cardiology on board and managing, currently on combination of Coreg, Lisabeth Register, monitor and adjust as needed.   Hypokalemia.  Replaced.    Dyslipidemia.  On statin.   DM type II.  Placed on long-acting insulin, sliding scale and Farxiga, diabetic education provided.  PCP to monitor.  Lab Results  Component Value Date   HGBA1C 10.8 (H) 09/24/2022  CBG (last 3)  Recent Labs    09/28/22 0050 09/28/22 0453 09/28/22 0736  GLUCAP 88 81 96    Discharge diagnosis     Principal Problem:   Cardiac arrest [facility])    Discharge instructions    Discharge Instructions     Amb Referral to Cardiac Rehabilitation   Complete by: As directed    Diagnosis: STEMI   After initial evaluation and assessments completed: Virtual Based Care may be provided alone or in conjunction with Phase 2 Cardiac Rehab based on patient barriers.: Yes   Intensive Cardiac Rehabilitation (ICR) [facility] location only OR Traditional Cardiac Rehabilitation (TCR) *If criteria for ICR are not met will enroll in TCR [facility] only): Yes   Discharge instructions   Complete by: As directed    Insulin PEN if approved, provide syringes and needles if needed.Please switch to any approved short acting Insulin if needed.   Increase activity slowly   Complete by: As directed        Discharge Medications   Allergies as of 09/28/2022       Reactions   Macrobid [nitrofurantoin] Other (See  Comments)   Morphine Nausea And Vomiting, Other (See Comments)   Niacin Rash        Medication List     STOP taking these medications    aspirin EC 325 MG tablet Replaced by: aspirin 81 MG chewable tablet       TAKE these medications    acetaminophen 325 MG tablet Commonly known as: TYLENOL Take 2 tablets (650 mg total) by mouth every 6 (six) hours as needed for moderate pain.   aspirin 81 MG chewable tablet Chew 1 tablet (81 mg total) by mouth daily. Replaces: aspirin EC 325 MG tablet   atorvastatin 80 MG tablet Commonly known as: LIPITOR Take 1 tablet (80 mg total) by mouth daily. What changed:  medication strength how much to take when to take this   Blood Glucose Monitoring Suppl Devi 1 each by Does not apply route in the morning, at noon, and at bedtime. May substitute to any manufacturer covered by patient's insurance.   BLOOD GLUCOSE TEST STRIPS Strp 1 each by In Vitro route in the morning, at noon, and at bedtime. May substitute to any manufacturer covered by patient's insurance.   carvedilol 12.5 MG tablet Commonly known as: COREG Take 1 tablet (12.5 mg total) by mouth 2 (two) times daily with a meal.   clopidogrel 75 MG tablet Commonly known as: PLAVIX Take 1 tablet (75 mg total) by mouth daily with breakfast.   dapagliflozin propanediol 10 MG Tabs tablet Commonly known as: FARXIGA Take 1 tablet (10 mg total) by mouth daily.   famotidine 20 MG tablet Commonly known as: PEPCID Take 1 tablet (20 mg total) by mouth 2 (two) times daily.   insulin aspart 100 UNIT/ML FlexPen Commonly known as: NOVOLOG Before each meal 3 times a day, 140-199 - 2 units, 200-250 - 4 units, 251-299 - 6 units,  300-349 - 8 units,  350 or above 10 units. Insulin PEN if approved, provide syringes and needles if needed.Please switch to any approved short acting Insulin if needed.   insulin detemir 100 UNIT/ML FlexPen Commonly known as: LEVEMIR Inject 15 Units into the skin  2 (two) times daily.   Lancet Device Misc 1 each by Does not apply route in the morning, at noon, and at bedtime. May substitute to any manufacturer covered by patient's insurance.   Lancets Misc. Misc 1 each by  Does not apply route in the morning, at noon, and at bedtime. May substitute to any manufacturer covered by patient's insurance.   Multi Complete Caps Take 1 tablet by mouth daily.   sacubitril-valsartan 24-26 MG Commonly known as: ENTRESTO Take 1 tablet by mouth 2 (two) times daily.   spironolactone 25 MG tablet Commonly known as: ALDACTONE Take 1 tablet (25 mg total) by mouth daily.   Vitamin D3 25 MCG (1000 UT) Caps Take 1,000 Units by mouth daily.               Durable Medical Equipment  (From admission, onward)           Start     Ordered   09/27/22 0954  For home use only DME Vest life vest  Once       Comments: Length of need: 3 months   09/27/22 3810             Follow-up Information     Lawerance Cruel, MD. Schedule an appointment as soon as possible for a visit in 3 day(s).   Specialty: Family Medicine Contact information: [address] [place] [address] [phone]         [place], Peter M, MD. Schedule an appointment as soon as possible for a visit in 1 week(s).   Specialty: Cardiology Contact information: [address] [place] [address] [phone]                 Major procedures and Radiology Reports - PLEASE review detailed and final reports thoroughly  -      CARDIAC CATHETERIZATION  Result Date: 09/26/2022 Conclusions: Severe single-vessel coronary artery disease; culprit lesion for the patient's inferior STEMI and cardiac arrest is likely 100% occlusion of mid RCA.  There is moderate (50-60%) disease involving the proximal LAD and mild (20%) stenosis of the proximal LCx. Mildly elevated left ventricular filling pressure (LVEDP 20 mmHg). Recommendations: Continue aggressive medical  therapy; defer PCI to occluded mid RCA given STEMI onset was ~48 hours ago and patient is without residual angina or shock. Dual antiplatelet therapy with aspirin and clopidogrel for 12 months. Escalate goal-directed medical therapy for acute HFrEF, as tolerated. Nelva Bush, MD [facility]  ECHOCARDIOGRAM COMPLETE  Result Date: 09/25/2022    ECHOCARDIOGRAM REPORT   Patient Name:   Brian [name] Date of Exam: 09/25/2022 Medical Rec #:  [id number]        Height:       70.0 in Accession #:    [id number]       Weight:       204.8 lb Date of Birth:  02/24/1966         BSA:          2.108 m Patient Age:    31 years         BP:           97/77 mmHg Patient Gender: M                HR:           88 bpm. Exam Location:  Inpatient Procedure: 2D Echo, Cardiac Doppler, Color Doppler and Intracardiac            Opacification Agent STAT ECHO Indications:    Cardiac arrest I46.9  History:        Patient has no prior history of Echocardiogram examinations.  Risk Factors:GERD and Dyslipidemia.  Sonographer:    Eulah Pont RDCS Referring Phys: [id number] Beulah Gandy PAYNE IMPRESSIONS  1. Left ventricular ejection fraction, by estimation, is 30%. The left ventricle has moderately decreased function. The left ventricle demonstrates regional wall motion abnormalities (see scoring diagram/findings for description). Left ventricular diastolic parameters are consistent with Grade I diastolic dysfunction (impaired relaxation).  2. Right ventricular systolic function is moderately reduced. The right ventricular size is mildly enlarged.  3. The mitral valve is normal in structure. Mild mitral valve regurgitation.  4. The aortic valve is tricuspid. Aortic valve regurgitation is not visualized. Aortic valve sclerosis is present, with no evidence of aortic valve stenosis. FINDINGS  Left Ventricle: Left ventricular ejection fraction, by estimation, is 30%. The left ventricle has moderately decreased function. The left  ventricle demonstrates regional wall motion abnormalities. Definity contrast agent was given IV to delineate the left ventricular endocardial borders. The left ventricular internal cavity size was normal in size. There is no left ventricular hypertrophy. Left ventricular diastolic parameters are consistent with Grade I diastolic dysfunction (impaired relaxation). Normal left ventricular filling pressure.  LV Wall Scoring: The entire inferior wall, posterior wall, mid inferoseptal segment, and basal inferoseptal segment are hypokinetic. Right Ventricle: The right ventricular size is mildly enlarged. No increase in right ventricular wall thickness. Right ventricular systolic function is moderately reduced. The tricuspid regurgitant velocity is 1.92 m/s, and with an assumed right atrial pressure of 15 mmHg, the estimated right ventricular systolic pressure is 29.7 mmHg. Left Atrium: Left atrial size was normal in size. Right Atrium: Right atrial size was normal in size. Pericardium: There is no evidence of pericardial effusion. Mitral Valve: The mitral valve is normal in structure. Mild mitral valve regurgitation. Tricuspid Valve: The tricuspid valve is normal in structure. Tricuspid valve regurgitation is trivial. Aortic Valve: The aortic valve is tricuspid. Aortic valve regurgitation is not visualized. Aortic valve sclerosis is present, with no evidence of aortic valve stenosis. Pulmonic Valve: The pulmonic valve was grossly normal. Pulmonic valve regurgitation is not visualized. Aorta: The aortic root and ascending aorta are structurally normal, with no evidence of dilitation. Venous: IVC assessment for right atrial pressure unable to be performed due to mechanical ventilation. IAS/Shunts: No atrial level shunt detected by color flow Doppler.  LEFT VENTRICLE PLAX 2D LVIDd:         4.80 cm     Diastology LVIDs:         4.20 cm     LV e' medial:    4.62 cm/s LV PW:         1.00 cm     LV E/e' medial:  7.9 LV IVS:         1.10 cm     LV e' lateral:   5.68 cm/s LVOT diam:     2.10 cm     LV E/e' lateral: 6.4 LV SV:         35 LV SV Index:   16 LVOT Area:     3.46 cm  LV Volumes (MOD) LV vol d, MOD A2C: 77.3 ml LV vol d, MOD A4C: 92.9 ml LV vol s, MOD A2C: 50.5 ml LV vol s, MOD A4C: 60.3 ml LV SV MOD A2C:     26.8 ml LV SV MOD A4C:     92.9 ml LV SV MOD BP:      29.9 ml RIGHT VENTRICLE RV S prime:     5.56 cm/s TAPSE (M-mode): 0.9 cm LEFT ATRIUM  Index        RIGHT ATRIUM           Index LA diam:        3.40 cm 1.61 cm/m   RA Area:     18.30 cm LA Vol (A2C):   35.4 ml 16.79 ml/m  RA Volume:   50.10 ml  23.76 ml/m LA Vol (A4C):   31.9 ml 15.13 ml/m LA Biplane Vol: 36.8 ml 17.45 ml/m  AORTIC VALVE LVOT Vmax:   67.70 cm/s LVOT Vmean:  48.550 cm/s LVOT VTI:    0.100 m  AORTA Ao Root diam: 3.00 cm Ao Asc diam:  3.50 cm MITRAL VALVE               TRICUSPID VALVE MV Area (PHT): 2.09 cm    TR Peak grad:   14.7 mmHg MV Decel Time: 363 msec    TR Vmax:        192.00 cm/s MV E velocity: 36.40 cm/s MV A velocity: 41.00 cm/s  SHUNTS MV E/A ratio:  0.89        Systemic VTI:  0.10 m                            Systemic Diam: 2.10 cm Rachelle Hora Croitoru MD Electronically signed by Thurmon Fair MD Signature Date/Time: 09/25/2022/10:44:57 AM    Final    CT HEAD WO CONTRAST ( )  Result Date: 09/25/2022 CLINICAL DATA:  Initial evaluation for altered mental status. EXAM: CT HEAD WITHOUT CONTRAST TECHNIQUE: Contiguous axial images were obtained from the base of the skull through the vertex without intravenous contrast. RADIATION DOSE REDUCTION: This exam was performed according to the departmental dose-optimization program which includes automated exposure control, adjustment of the mA and/or kV according to patient size and/or use of iterative reconstruction technique. COMPARISON:  None Available. FINDINGS: Brain: Cerebral volume within normal limits. No acute intracranial hemorrhage. No acute large vessel territory infarct. No mass  lesion or midline shift. No hydrocephalus or extra-axial fluid collection. Vascular: No convincing abnormal hyperdense vessel. Skull: Scalp soft tissues and calvarium demonstrate no acute finding. Sinuses/Orbits: Globes orbital soft tissues within normal limits. Trace layering secretions noted within the sphenoid sinuses. Mastoid air cells are clear. Patient is intubated. Other: None. IMPRESSION: Negative head CT. No acute intracranial abnormality identified. Electronically Signed   By: Rise Mu M.D.   On: 09/25/2022 01:24   DG CHEST PORT 1 VIEW  Result Date: 09/24/2022 CLINICAL DATA:  Central line and endotracheal tube placement. EXAM: PORTABLE CHEST 1 VIEW COMPARISON:  Radiograph earlier today. FINDINGS: New left internal jugular central venous catheter tip overlies the upper SVC. No pneumothorax. Endotracheal tube tip is 5.8 cm from the carina. Enteric tube in place with tip below the diaphragm not included in this chest field of view. Lung volumes are low. Stable heart size and mediastinal contours. No developing airspace disease. IMPRESSION: 1. New left internal jugular central venous catheter tip overlies the upper SVC. No pneumothorax. 2. Endotracheal tube tip 5.8 cm from the carina. Enteric tube in place. 3. Low lung volumes. Electronically Signed   By: Narda Rutherford M.D.   On: 09/24/2022 23:49   DG Abd 1 View  Result Date: 09/24/2022 CLINICAL DATA:  Orogastric tube placement. EXAM: ABDOMEN - 1 VIEW COMPARISON:  None Available. FINDINGS: Tip and side port of the enteric tube below the diaphragm in the stomach. Nonobstructive upper abdominal bowel gas pattern. Moderate volume of  stool in the colon. IMPRESSION: Tip and side port of the enteric tube below the diaphragm in the stomach. Electronically Signed   By: Keith Rake M.D.   On: 09/24/2022 23:47   DG Chest Portable 1 View  Result Date: 09/24/2022 CLINICAL DATA:  Cardiac arrest. EXAM: PORTABLE CHEST 1 VIEW COMPARISON:  None  Available. FINDINGS: Endotracheal tube tip is approximately 3.5 cm from the carina. Enteric tube is below the diaphragm not included in the field of view. Lung volumes are low. Heart size upper normal. Grossly normal mediastinal contours for technique. No confluent airspace disease, large pleural effusion or visible pneumothorax. Right humeral head arthroplasty. Question postsurgical change of the left acromioclavicular joint. No grossly displaced rib fractures are seen. IMPRESSION: 1. Endotracheal tube tip approximately 3.5 cm from the carina. Enteric tube is below the diaphragm not included in the field of view. 2. Low lung volumes. Electronically Signed   By: Keith Rake M.D.   On: 09/24/2022 20:26    Today   Subjective    [name] today has no headache,no chest abdominal pain,no new weakness tingling or numbness, feels much better wants to go home today.    Objective   Blood pressure (!) 142/86, pulse 100, temperature 98.7 F (37.1 C), temperature source Oral, resp. rate 13, height 5\' 10"  (1.778 m), weight 89.3 kg, SpO2 95 %.   Intake/Output Summary (Last 24 hours) at 09/28/2022 0852 Last data filed at 09/28/2022 0500 Gross per 24 hour  Intake 833 ml  Output 1000 ml  Net -167 ml    Exam  Awake Alert, No new F.N deficits,    [place].AT,PERRAL Supple Neck,   Symmetrical Chest wall movement, Good air movement bilaterally, CTAB RRR,No Gallops,   +ve B.Sounds, Abd Soft, Non tender,  No Cyanosis, Clubbing or edema    Data Review   Recent Labs  Lab 09/24/22 1822 09/24/22 1921 09/24/22 2323 09/25/22 0210 09/26/22 0338 09/27/22 0403 09/28/22 0655  WBC 9.9  --   --  23.6* 11.2* 8.7 8.9  HGB 15.9   < > 14.3 15.0 11.8* 11.7* 12.5*  HCT 44.4   < > 42.0 43.5 34.3* 33.8* 35.4*  PLT 160  --   --  202 142* 125* 159  MCV 86.7  --   --  90.4 89.6 90.9 88.3  MCH 31.1  --   --  31.2 30.8 31.5 31.2  MCHC 35.8  --   --  34.5 34.4 34.6 35.3  RDW 12.4  --   --  12.7 12.9 13.1 12.7   LYMPHSABS 3.6  --   --   --   --   --  1.5  MONOABS 0.7  --   --   --   --   --  0.8  EOSABS 0.2  --   --   --   --   --  0.4  BASOSABS 0.1  --   --   --   --   --  0.0   < > = values in this interval not displayed.    Recent Labs  Lab 09/24/22 1822 09/24/22 1921 09/24/22 2224 09/24/22 2323 09/25/22 0208 09/25/22 0210 09/25/22 0631 09/25/22 1010 09/25/22 1502 09/25/22 1651 09/26/22 0338 09/27/22 0403 09/27/22 0627 09/28/22 0655  NA 129*   < >  --    < >  --    < > 136 135 136  --  138 138  --  134*  K 4.1   < >  --    < >  --    < >  3.3* 3.3* 3.3*  --  3.7 4.0  --  3.3*  CL 96*  --   --   --   --    < > 98 98 95*  --  100 106  --  102  CO2 20*  --   --   --   --    < > 22 24 24   --  29 27  --  17*  ANIONGAP 13  --   --   --   --    < > 16* 13 17*  --  9 5  --  15  GLUCOSE 321*  --   --   --   --    < > 303* 216* 263*  --  141* 99  --  108*  BUN 15  --   --   --   --    < > 22* 23* 23*  --  12 11  --  17  CREATININE 1.04  --   --   --   --    < > 1.54* 1.38* 1.39*  --  1.16 1.01  --  0.98  AST 39  --   --   --   --   --   --   --   --   --   --   --   --  102*  ALT 33  --   --   --   --   --   --   --   --   --   --   --   --  68*  ALKPHOS 99  --   --   --   --   --   --   --   --   --   --   --   --  68  BILITOT 0.5  --   --   --   --   --   --   --   --   --   --   --   --  1.7*  ALBUMIN 4.2  --   --   --   --   --   --   --   --   --   --   --   --  2.8*  PROCALCITON  --   --   --   --   --   --   --   --   --  12.09  --   --   --   --   LATICACIDVEN  --   --  >9.0*  --  7.5*  --   --   --   --   --   --   --   --   --   HGBA1C  --   --  10.8*  --   --   --   --   --   --   --   --   --   --   --   BNP  --   --   --   --   --   --   --   --   --   --   --   --  517.8* 266.5*  MG  --   --  2.9*  --   --    < > 2.2 2.1 1.9  --   --   --  1.9 1.7  CALCIUM 8.8*  --   --   --   --    < >  8.0* 8.3* 8.3*  --  8.1* 8.7*  --  8.7*   < > = values in this interval not displayed.    Lab Results  Component Value Date   CHOL 107 09/26/2022   HDL 18 (L) 09/26/2022   LDLCALC UNABLE TO CALCULATE IF TRIGLYCERIDE OVER 400 mg/dL 94/76/5465   LDLDIRECT 21.9 09/26/2022   TRIG 428 (H) 09/26/2022   CHOLHDL 5.9 09/26/2022    Total Time in preparing paper work, data evaluation and todays exam - 35 minutes  Signature  -    Susa Raring M.D on 09/28/2022 at 8:52 AM   -  To page go to [email or web address]

## 2022-09-28 NOTE — Progress Notes (Signed)
Rounding Note    Patient Name: Brian [name] Date of Encounter: 09/28/2022  [place] Cardiologist: Jenkins Rouge, MD   Subjective   Feels well. Has ambulated in halls several times and other than chest soreness is doing well.    Inpatient Medications    Scheduled Meds:  acetaminophen  650 mg Oral Q6H   aspirin  81 mg Oral Daily   atorvastatin  80 mg Oral Daily   carvedilol  6.25 mg Oral BID WC   Chlorhexidine Gluconate Cloth  6 each Topical Daily   clopidogrel  75 mg Oral Q breakfast   dapagliflozin propanediol  10 mg Oral Daily   dextromethorphan-guaiFENesin  1 tablet Oral BID   docusate  100 mg Oral BID   enoxaparin (LOVENOX) injection  40 mg Subcutaneous Q24H   famotidine  20 mg Oral BID   icosapent Ethyl  2 g Oral BID   insulin aspart  2-6 Units Subcutaneous Q4H   insulin detemir  15 Units Subcutaneous Q12H   lidocaine  1 patch Transdermal Q24H   melatonin  6 mg Oral QHS   sacubitril-valsartan  1 tablet Oral BID   sodium chloride flush  3 mL Intravenous Q12H   spironolactone  25 mg Oral Daily   Continuous Infusions:  sodium chloride Stopped (09/27/22 0353)   PRN Meds: sodium chloride, benzonatate, dextrose, docusate, mouth rinse, oxyCODONE, polyethylene glycol, sodium chloride flush   Vital Signs    Vitals:   09/28/22 0300 09/28/22 0400 09/28/22 0500 09/28/22 0738  BP: 130/81 (!) 144/93 (!) 142/86   Pulse: 90 92 100   Resp: 15 15 13    Temp:  98.2 F (36.8 C)  98.7 F (37.1 C)  TempSrc:  Oral  Oral  SpO2: 94% 90% 95%   Weight:   89.3 kg   Height:        Intake/Output Summary (Last 24 hours) at 09/28/2022 8676 Last data filed at 09/28/2022 0500 Gross per 24 hour  Intake 833 ml  Output 1000 ml  Net -167 ml       09/28/2022    5:00 AM 09/27/2022    4:00 AM 09/26/2022    4:45 AM  Last 3 Weights  Weight (lbs) 196 lb 13.9 oz 202 lb 13.2 oz 206 lb 5.6 oz  Weight (kg) 89.3 kg 92 kg 93.6 kg      Telemetry    Sinus rhythm-rare PVC  Personally Reviewed  ECG    Sinus rhythm- Personally Reviewed  Physical Exam   GEN: No acute distress.  Awake and alert Neck: No JVD Cardiac: RRR, no murmurs, rubs, or gallops.  Respiratory: Clear to auscultation bilaterally. GI: Soft, nontender, non-distended  MS: No edema; No deformity.  Neuro:  Nonfocal  Psych: Normal affect   Labs    High Sensitivity Troponin:   Recent Labs  Lab 09/24/22 1823 09/25/22 1651  TROPONINIHS 28* >24,000*      Chemistry Recent Labs  Lab 09/24/22 1822 09/24/22 1921 09/25/22 1502 09/26/22 0338 09/27/22 0403 09/27/22 0627 09/28/22 0655  NA 129*   < > 136 138 138  --  134*  K 4.1   < > 3.3* 3.7 4.0  --  3.3*  CL 96*   < > 95* 100 106  --  102  CO2 20*   < > 24 29 27   --  17*  GLUCOSE 321*   < > 263* 141* 99  --  108*  BUN 15   < > 23*  12 11  --  17  CREATININE 1.04   < > 1.39* 1.16 1.01  --  0.98  CALCIUM 8.8*   < > 8.3* 8.1* 8.7*  --  8.7*  MG  --    < > 1.9  --   --  1.9 1.7  PROT 7.5  --   --   --   --   --  6.3*  ALBUMIN 4.2  --   --   --   --   --  2.8*  AST 39  --   --   --   --   --  102*  ALT 33  --   --   --   --   --  68*  ALKPHOS 99  --   --   --   --   --  68  BILITOT 0.5  --   --   --   --   --  1.7*  GFRNONAA >60   < > 59* >60 >60  --  >60  ANIONGAP 13   < > 17* 9 5  --  15   < > = values in this interval not displayed.     Lipids  Recent Labs  Lab 09/26/22 0338  CHOL 107  TRIG 428*  HDL 18*  LDLCALC UNABLE TO CALCULATE IF TRIGLYCERIDE OVER 400 mg/dL  CHOLHDL 5.9     Hematology Recent Labs  Lab 09/26/22 0338 09/27/22 0403 09/28/22 0655  WBC 11.2* 8.7 8.9  RBC 3.83* 3.72* 4.01*  HGB 11.8* 11.7* 12.5*  HCT 34.3* 33.8* 35.4*  MCV 89.6 90.9 88.3  MCH 30.8 31.5 31.2  MCHC 34.4 34.6 35.3  RDW 12.9 13.1 12.7  PLT 142* 125* 159    Thyroid No results for input(s): "TSH", "FREET4" in the last 168 hours.  BNP Recent Labs  Lab 09/27/22 0627 09/28/22 0655  BNP 517.8* 266.5*    DDimer No results  for input(s): "DDIMER" in the last 168 hours.   Radiology    CARDIAC CATHETERIZATION  Result Date: 09/26/2022 Conclusions: Severe single-vessel coronary artery disease; culprit lesion for the patient's inferior STEMI and cardiac arrest is likely 100% occlusion of mid RCA.  There is moderate (50-60%) disease involving the proximal LAD and mild (20%) stenosis of the proximal LCx. Mildly elevated left ventricular filling pressure (LVEDP 20 mmHg). Recommendations: Continue aggressive medical therapy; defer PCI to occluded mid RCA given STEMI onset was ~48 hours ago and patient is without residual angina or shock. Dual antiplatelet therapy with aspirin and clopidogrel for 12 months. Escalate goal-directed medical therapy for acute HFrEF, as tolerated. Nelva Bush, MD [facility]   Cardiac Studies   TTE   1. Left ventricular ejection fraction, by estimation, is 30%. The left  ventricle has moderately decreased function. The left ventricle  demonstrates regional wall motion abnormalities (see scoring  diagram/findings for description). Left ventricular  diastolic parameters are consistent with Grade I diastolic dysfunction  (impaired relaxation).   2. Right ventricular systolic function is moderately reduced. The right  ventricular size is mildly enlarged.   3. The mitral valve is normal in structure. Mild mitral valve  regurgitation.   4. The aortic valve is tricuspid. Aortic valve regurgitation is not  visualized. Aortic valve sclerosis is present, with no evidence of aortic  valve stenosis.   Cardiac cath:Conclusion  Conclusions: Severe single-vessel coronary artery disease; culprit lesion for the patient's inferior STEMI and cardiac arrest is likely 100% occlusion of mid RCA.  There is moderate (50-60%) disease involving the proximal LAD and mild (20%) stenosis of the proximal LCx. Mildly elevated left ventricular filling pressure (LVEDP 20 mmHg).   Recommendations: Continue  aggressive medical therapy; defer PCI to occluded mid RCA given STEMI onset was ~48 hours ago and patient is without residual angina or shock. Dual antiplatelet therapy with aspirin and clopidogrel for 12 months. Escalate goal-directed medical therapy for acute HFrEF, as tolerated.   Nelva Bush, MD [facility]  Coronary Diagrams  Diagnostic Dominance: Right  Intervention   Patient Profile     57 y.o. male history of hypertension, type 2 diabetes presented to hospital with out of hospital cardiac arrest and inferior STEMI.  Assessment & Plan    1.  Ventricular fibrillation arrest: secondary to acute  inferior STEMI.  Remains in normal rhythm.  Plan Lifevest at discharge. Continue beta blocker. Reassess LV function at 3 months. If EF remains < 35% will need ICD.   2.  Inferior STEMI: Inferior ST elevation,.  Did not have emergent cardiac cath as the patient had a prolonged arrest and ST elevations were improving.  EF 30% with inferior/posterior wall motion abnormality. Normal ETT in November. Prior coronary CTA in 2021 with nonobstructive CAD. Cardiac cath demonstrated occluded mid RCA. Nonobstructive disease in the mid LAD. Plan medical management. On DAPT, statin, beta blocker  3.  Acute systolic heart failure: Ejection fraction 30% with hypokinesis of multiple areas.  Likely due to both inferior STEMI and stunning from prolonged arrest.  BP has improved off pressors. Initiating  guideline directed medical therapy. On Entresto 24/26 mg bid. Will increase Coreg to 12.5 mg bid. Farxiga 10 mg daily. Start aldactone 25 mg daily. Recommend repeat CMET on Friday.  Titrate as tolerated as outpatient    4. DM type 2. A1c 10.8 on admit. Per primary team  5. HLD - on high dose statin therapy. Also has triglycerides elevation 428 due to poorly controlled DM. Added Vascepa  6. Elevated transaminases - I suspect this is more related to shock liver given presentation. Will need to be  followed closely as outpatient. For now will continue statin.   [place] will sign off.  Patient appears stable for DC today Medication Recommendations:  see MAR Other recommendations (labs, testing, etc):  CMET in 2 days Follow up as an outpatient:  will arrange TOC visit in 1 week.    For questions or updates, please contact [place] Please consult [email or web address] for contact info under        Signed, Nadyne Gariepy [place], MD  09/28/2022, 8:22 AM

## 2022-09-28 NOTE — Care Management (Signed)
[id number] Patient has been fit for Life Vest and medications to be delivered via [facility] to bedside. Per staff RN family will transport home. No further needs identified from Case Manager.

## 2022-09-29 ENCOUNTER — Telehealth (HOSPITAL_COMMUNITY): Payer: Self-pay

## 2022-09-29 NOTE — Telephone Encounter (Signed)
Called patient to see if he is interested in the Cardiac Rehab Program. Patient expressed interest. Explained scheduling process and went over insurance, patient verbalized understanding. Will contact patient for scheduling once f/u has been completed.  °

## 2022-09-30 LAB — CULTURE, BLOOD (ROUTINE X 2)
Culture: NO GROWTH
Culture: NO GROWTH
Special Requests: ADEQUATE
Special Requests: ADEQUATE

## 2022-10-05 NOTE — Progress Notes (Unsigned)
Cardiology Office Note:    Date:  10/06/2022   ID:  Bryna Colander, DOB 1966/05/05, MRN [id number]  PCP:  Lawerance Cruel, MD   [facility] Providers Cardiologist:  Jenkins Rouge, MD     Referring MD: Lawerance Cruel, MD   Chief Complaint: post hospital follow-up  History of Present Illness:    Brian [name] is a very pleasant 57 y.o. male with a hx of elevated calcium score with moderate LAD lesion but negative FFR on cardiac CT 01/2020, HLD, type 2 DM, family hx early CAD, GERD, CAD s/p inferior STEMI and V-fib cardiac arrest, and ICM.   Father and uncle had premature CAD, father had CABG. normal ETT 2015.  Coronary CTA 01/2020 with calcium score of 252, 90th percentile, with moderate stenosis (50-60%) in prox LAD without significant stenosis on FFR.   Seen by cardiology in 2015.  Most recent cardiology clinic visit 07/15/2022 with Dr. Johnsie Cancel after 2-year absence.  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  Frequent UTIs with some renal stones on CT. Recent report of indeterminate hypodense left renal lesion, was advised to get an MRI.  He reported occasional chest tightness. ECG unremarkable. Underwent ETT for evaluation of chest tightness 07/26/22 which revealed no ST deviation no evidence of ischemia or significant arrhythmias. Was advised to return in 1 year for follow-up.  Presented to ED 08/2722 with 1-1/2 hours of chest heaviness. Reported for the past several days not feeling quite right but did not specifically feel pain. He presented by private vehicle to ED and when saw crowding of waiting room, called his wife to come and get him. He became unresponsive before they left the parking lot. CPR immediately initiated prior to getting him out of the car. V tach cardiac arrest requiring 1 hour of CPR due to inferior STEMI.  Required brief intubation and pressor support with Levophed. Cardiology was consulted and he  underwent left heart catheterization 09/26/22 which revealed 100% occlusion of mid RCA, moderate (50 to 60%) disease involving proximal LAD and mild (20%) stenosis of proximal LCx.  Recommendation for aggressive medical management and defer PCI given STEMI onset was approximately 48 hours prior and he is without residual angina or shock. DAPT with aspirin and clopidogrel x 12 months, escalate therapy for acute HFrEF. LVEF on echo 09/25/2428%, G1 DD, moderately reduced RV function, mild enlargement RV, mild MR. GDMT initiated including carvedilol 12.5 mg twice daily, Farxiga 10 mg daily, Entresto 24-26 mg twice daily, and spironolactone 25 mg daily. Atorvastatin at 80 mg daily.  Today, he is here with his wife for follow-up. He is obviously very upset about the recent events but thankful to be alive. Had prior coronary CT and ETT and wonders why these tests did not prevent his MI. Having significant chest discomfort with breathing and has not been able to sleep due to chest discomfort and pain in the right side of his neck and right shoulder. Tried to sleep in the recliner last night with not much improvement. He denies chest pain, shortness of breath, lower extremity edema, fatigue, palpitations, melena, hematuria, hemoptysis, diaphoresis, presyncope, syncope, orthopnea, and PND. He has already significantly improved his diet, planning to follow a mostly plant based diet. 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 We had a lengthy discussion about hospitalization, testing, management of CHF and CAD, and monitoring for worsening symptoms.   Past  Medical History:  Diagnosis Date   GERD (gastroesophageal reflux disease)    hx. reflux.   Hyperlipidemia    tx. with meds(high triglycerides)   Recurrent UTI 06/30/2020   Renal cyst 08/17/2020   Ventral hernia     Past Surgical History:  Procedure Laterality Date   ADENOIDECTOMY  ` 1972   BACK SURGERY      HERNIA REPAIR     LAMINECTOMY AND MICRODISCECTOMY LUMBAR SPINE  05/02/2014   redo   LEFT HEART CATH AND CORONARY ANGIOGRAPHY N/A 09/26/2022   Procedure: LEFT HEART CATH AND CORONARY ANGIOGRAPHY;  Surgeon: Yvonne Kendall, MD;  Location: [facility] INVASIVE CV LAB;  Service: Cardiovascular;  Laterality: N/A;   LUMBAR LAMINECTOMY FOR EPIDURAL ABSCESS N/A 05/02/2014   Procedure: LUMBAR LAMINECTOMY FOR EPIDURAL ABSCESS, lumbar four-five right;  Surgeon: Mariam Dollar, MD;  Location: [facility] NEURO ORS;  Service: Neurosurgery;  Laterality: N/A;   MICRODISCECTOMY LUMBAR  10/2001   "L3-4"   ORIF CLAVICULAR FRACTURE Left 07/1988   POSTERIOR LAMINECTOMY / DECOMPRESSION LUMBAR SPINE  03/07/2014   "L4-5"   UMBILICAL HERNIA REPAIR  ~ 2011   VENTRAL HERNIA REPAIR  07/27/2011   Procedure: LAPAROSCOPIC VENTRAL HERNIA;  Surgeon: Mariella Saa, MD;  Location: [facility] ORS;  Service: General;  Laterality: N/A;   WRIST FRACTURE SURGERY Left 07/1988   with retained hardware    Current Medications: Current Meds  Medication Sig   Accu-Chek Softclix Lancets lancets Use 1 each in the morning, at noon, and at bedtime.   acetaminophen (TYLENOL) 325 MG tablet Take 2 tablets (650 mg total) by mouth every 6 (six) hours as needed for moderate pain.   aspirin 81 MG chewable tablet Chew 1 tablet (81 mg total) by mouth daily.   atorvastatin (LIPITOR) 80 MG tablet Take 1 tablet (80 mg total) by mouth daily.   Blood Glucose Monitoring Suppl (ACCU-CHEK GUIDE) w/Device KIT Use in the morning, at noon, and at bedtime.   Blood Pressure KIT 1 Units by Does not apply route daily as needed (BP).   carvedilol (COREG) 12.5 MG tablet Take 1 tablet (12.5 mg total) by mouth 2 (two) times daily with a meal.   Cholecalciferol (VITAMIN D3) 25 MCG (1000 UT) CAPS Take 1,000 Units by mouth daily.   clopidogrel (PLAVIX) 75 MG tablet Take 1 tablet (75 mg total) by mouth daily with breakfast.   dapagliflozin propanediol (FARXIGA) 10 MG TABS tablet Take 1  tablet (10 mg total) by mouth daily.   famotidine (PEPCID) 20 MG tablet Take 1 tablet (20 mg total) by mouth 2 (two) times daily.   glucose blood test strip Use 1 each in the morning, at noon, and at bedtime.   insulin aspart (NOVOLOG) 100 UNIT/ML FlexPen Before each meal 3 times a day, 140-199 - 2 units, 200-250 - 4 units, 251-299 - 6 units,  300-349 - 8 units,  350 or above 10 units.   insulin detemir (LEVEMIR) 100 UNIT/ML FlexPen Inject 15 Units into the skin 2 (two) times daily.   Insulin Pen Needle 32G X 4 MM MISC Use with Levemir and Novolog flexpen   Lancets Misc. (ACCU-CHEK FASTCLIX LANCET) KIT Use in the morning, at noon, and at bedtime.   Multiple Vitamins-Minerals (MULTI COMPLETE) CAPS Take 1 tablet by mouth daily.   nitroGLYCERIN (NITROSTAT) 0.4 MG SL tablet Place 1 tablet (0.4 mg total) under the tongue every 5 (five) minutes as needed for chest pain.   sacubitril-valsartan (ENTRESTO) 24-26 MG Take 1 tablet by  mouth 2 (two) times daily.   spironolactone (ALDACTONE) 25 MG tablet Take 1 tablet (25 mg total) by mouth daily.   tiZANidine (ZANAFLEX) 2 MG tablet Take by mouth.     Allergies:   Macrobid [nitrofurantoin], Morphine, and Niacin   Social History   Socioeconomic History   Marital status: Married    Spouse name: Sunday Spillers   Number of children: 2   Years of education: Not on file   Highest education level: Bachelor's degree (e.g., BA, AB, BS)  Occupational History   Occupation: Cabin crew  Tobacco Use   Smoking status: Never   Smokeless tobacco: Never  Vaping Use   Vaping Use: Never used  Substance and Sexual Activity   Alcohol use: No   Drug use: No   Sexual activity: Not Currently  Other Topics Concern   Not on file  Social History Narrative   Not on file   Social Determinants of Health   Financial Resource Strain: Low Risk  (09/28/2022)   Overall Financial Resource Strain (CARDIA)    Difficulty of Paying Living Expenses: Not very hard  Food Insecurity:  No Food Insecurity (09/28/2022)   Hunger Vital Sign    Worried About Running Out of Food in the Last Year: Never true    Ran Out of Food in the Last Year: Never true  Transportation Needs: No Transportation Needs (09/28/2022)   PRAPARE - Hydrologist (Medical): No    Lack of Transportation (Non-Medical): No  Physical Activity: Not on file  Stress: Not on file  Social Connections: Not on file     Family History: The patient's family history includes Heart disease in his father and paternal uncle.  ROS:   Please see the history of present illness.    + chest tenderness All other systems reviewed and are negative.  Labs/Other Studies Reviewed:    The following studies were reviewed today:  LHC 09/26/22 Conclusions: Severe single-vessel coronary artery disease; culprit lesion for the patient's inferior STEMI and cardiac arrest is likely 100% occlusion of mid RCA. There is moderate (50-60%) disease involving the proximal LAD and mild (20%) stenosis of the proximal LCx. Mildly elevated left ventricular filling pressure (LVEDP 20 mmHg).   Recommendations: Continue aggressive medical therapy; defer PCI to occluded mid RCA given STEMI onset was ~48 hours ago and patient is without residual angina or shock. Dual antiplatelet therapy with aspirin and clopidogrel for 12 months. Escalate goal-directed medical therapy for acute HFrEF, as tolerated.   Diagnostic Dominance: Right   Echo 09/25/22 1. Left ventricular ejection fraction, by estimation, is 30%. The left  ventricle has moderately decreased function. The left ventricle  demonstrates regional wall motion abnormalities (see scoring  diagram/findings for description). Left ventricular  diastolic parameters are consistent with Grade I diastolic dysfunction  (impaired relaxation).   2. Right ventricular systolic function is moderately reduced. The right  ventricular size is mildly enlarged.   3. The  mitral valve is normal in structure. Mild mitral valve  regurgitation.   4. The aortic valve is tricuspid. Aortic valve regurgitation is not  visualized. Aortic valve sclerosis is present, with no evidence of aortic  valve stenosis.  ETT 07/26/22   No ST deviation was noted.   NSR No ischemia Normal hemodynamic response No significant arrhythmias    Coronary CTA 01/2020 1. Coronary calcium score of 252. This was 90th percentile for age and sex matched controls.   2. Normal coronary origin with right dominance.  3. The proximal LAD contains a moderate mixed density stenosis (50-60%) with napkin ring sign (low attenuation plaque, spotty calcification) indicative of vulnerable plaque.   4. Minimal (<25%) calcified plaque in the LCX and mild non-calcified plaque (25-49%) in the RCA.   RECOMMENDATIONS: 1. Moderate stenosis (50-60%) in the proximal LAD. Additional analysis with CT FFR will be submitted  IMPRESSION: 1.  CT FFR analysis didn't show any significant stenosis.   Recent Labs: 09/28/2022: ALT 68; B Natriuretic Peptide 266.5; BUN 17; Creatinine, Ser 0.98; Hemoglobin 12.5; Magnesium 1.7; Platelets 159; Potassium 3.3; Sodium 134  Recent Lipid Panel    Component Value Date/Time   CHOL 107 09/26/2022 0338   TRIG 428 (H) 09/26/2022 0338   HDL 18 (L) 09/26/2022 0338   CHOLHDL 5.9 09/26/2022 0338   VLDL UNABLE TO CALCULATE IF TRIGLYCERIDE OVER 400 mg/dL 58/52/7782 4235   LDLCALC UNABLE TO CALCULATE IF TRIGLYCERIDE OVER 400 mg/dL 36/14/4315 4008   LDLDIRECT 21.9 09/26/2022 0338     Risk Assessment/Calculations:      Physical Exam:    VS:  BP 126/70   Pulse 94   Ht 5\' 10"  (1.778 m)   Wt 185 lb 6.4 oz (84.1 kg)   SpO2 98%   BMI 26.60 kg/m     Wt Readings from Last 3 Encounters:  10/06/22 185 lb 6.4 oz (84.1 kg)  09/28/22 196 lb 13.9 oz (89.3 kg)  07/15/22 201 lb 12.8 oz (91.5 kg)     GEN:  Well nourished, well developed in no acute distress HEENT:  Normal NECK: No JVD; No carotid bruits CARDIAC: RRR, no murmurs, rubs, gallops RESPIRATORY:  Clear to auscultation without rales, wheezing or rhonchi  ABDOMEN: Soft, non-tender, non-distended MUSCULOSKELETAL:  No edema. 2+ pedal/radial/brachial pulses, equal bilaterally SKIN: Warm and dry. Significant bruising in various stages to RUE. Dime-size swelling at radial cath site NEUROLOGIC:  Alert and oriented x 3 PSYCHIATRIC:  Normal affect   EKG:  EKG is not ordered today.     Diagnoses:    1. Coronary artery disease involving native coronary artery of native heart without angina pectoris   2. Cardiac arrest (HCC)    Assessment and Plan:     CAD s/p STEMI: Inferior STEMI with 100% occlusion RCA. Cardiac cath prolonged due to arrest and improvement in ST elevation. Nonobstructive CAD in mid LAD and mild 20% stenosis of proximal LCx. At time of cath, he was pain free. Plan medical management of angina and escalation of therapy for acute HFrEF. DAPT with ASA and clopidogrel x 12 months.  He reports he has had no symptoms of angina. Has significant chest tenderness across entire torso and in his shoulder and neck likely from CPR. Is not sleeping well. Was given Zanaflex by PCP on 2/7 and is going back today for follow-up.  Wife asks about massage which will need to be delayed due to LifeVest. He is walking 10-15 minutes several times per day without dyspnea or chest pain, no presyncope or syncope. Advised continued gradual increase in activity but continue to observe limitations. Would like to have sl ntg to keep on hand. I reviewed how to use and ER precaution. We reviewed GDMT for CAD. Continue carvedilol, clopidogrel, aspirin, atorvastatin.  Cardiac arrest:  Secondary to acute inferior STEMI. Had 1 hour of CPR. LifeVest at discharge. Significant chest, shoulder, neck tenderness. Using splinting to help with cough, movement.  Was given Zanaflex by PCP, is going for follow-up appointment today.  Continue light physical activity and  splinting to help with movement. Will take time to heal completely.   ICM/Acute HFrEF: LVEF 30% 09/25/22. Is wearing LifeVest. He denies dyspnea, orthopnea, edema, PND. Appears euvolemic on exam. Adherent to 2L fluid restriction. Weight is stable. Reviewed GDMT which he is tolerating without concern.  Plan to reassess with echo in 3 months and if EF remains < 35 %, will refer to EP for ICD. Continue spironolactone, carvedilol, [place] C[place]. In anticipation of upcoming appointment with AHF clinic on 10/12/22, will update CMP, BNP.   Elevated transaminases: Felt likely to be 2/2 shock liver. Currently on high-dose statin therapy. Will recheck today.  Hyperlipidemia LDL goal < 55: Normal lipoprotein a, LDL 21, trigs 428. Currently on high-dose atorvastatin. Will get apolipoprotein b in addition to other labs. Will refer to Pharm D for management.    Cardiac Rehabilitation Eligibility Assessment  The patient has declined or is not appropriate for cardiac rehabilitation.     Disposition: Keep your appointment with [facility] for 10/12/22  Medication Adjustments/Labs and Tests Ordered: Current medicines are reviewed at length with the patient today.  Concerns regarding medicines are outlined above.  Orders Placed This Encounter  Procedures   AMB Referral to [facility] Pharm-D   Meds ordered this encounter  Medications   nitroGLYCERIN (NITROSTAT) 0.4 MG SL tablet    Sig: Place 1 tablet (0.4 mg total) under the tongue every 5 (five) minutes as needed for chest pain.    Dispense:  25 tablet    Refill:  3    Patient Instructions  Medication Instructions:   START Nitro  one (1) tablet by mouth (0.4 mg) sublingual.  If a single episode of chest pain is not relieved by one tablet, the patient will try another within 5 minutes; and if this doesn't relieve the pain, the patient will try another within 5 minutes and if this doesn't relieve the  pain the patient is instructed to call 911 for transportation to an emergency department.  *If you need a refill on your cardiac medications before your next appointment, please call your pharmacy*   Lab Work:  None ordered.  If you have labs (blood work) drawn today and your tests are completely normal, you will receive your results only by: MyChart Message (if you have MyChart) OR A paper copy in the mail If you have any lab test that is abnormal or we need to change your treatment, we will call you to review the results.   Testing/Procedures:  None ordered.   Follow-Up: At [facility], you and your health needs are our priority.  As part of our continuing mission to provide you with exceptional heart care, we have created designated Provider Care Teams.  These Care Teams include your primary Cardiologist (physician) and Advanced Practice Providers (APPs -  Physician Assistants and Nurse Practitioners) who all work together to provide you with the care you need, when you need it.  We recommend signing up for the patient portal called "MyChart".  Sign up information is provided on this After Visit Summary.  MyChart is used to connect with patients for Virtual Visits (Telemedicine).  Patients are able to view lab/test results, encounter notes, upcoming appointments, etc.  Non-urgent messages can be sent to your provider as well.   To learn more about what you can do with MyChart, go to [email or web address].    Your next appointment:   1 week(s)  Provider:   [facility]     Other Instructions  You have been referred to Pharmacist for discussion of your Lipids.       Signed, Emmaline Life, NP  10/06/2022 3:50 PM    [facility]

## 2022-10-06 ENCOUNTER — Ambulatory Visit: Payer: BC Managed Care – PPO | Attending: Nurse Practitioner | Admitting: Nurse Practitioner

## 2022-10-06 ENCOUNTER — Encounter: Payer: Self-pay | Admitting: Nurse Practitioner

## 2022-10-06 ENCOUNTER — Other Ambulatory Visit: Payer: BC Managed Care – PPO

## 2022-10-06 ENCOUNTER — Telehealth: Payer: Self-pay | Admitting: *Deleted

## 2022-10-06 VITALS — BP 126/70 | HR 94 | Ht 70.0 in | Wt 185.4 lb

## 2022-10-06 DIAGNOSIS — E119 Type 2 diabetes mellitus without complications: Secondary | ICD-10-CM | POA: Diagnosis not present

## 2022-10-06 DIAGNOSIS — I2510 Atherosclerotic heart disease of native coronary artery without angina pectoris: Secondary | ICD-10-CM

## 2022-10-06 DIAGNOSIS — G4700 Insomnia, unspecified: Secondary | ICD-10-CM | POA: Diagnosis not present

## 2022-10-06 DIAGNOSIS — I469 Cardiac arrest, cause unspecified: Secondary | ICD-10-CM

## 2022-10-06 DIAGNOSIS — F411 Generalized anxiety disorder: Secondary | ICD-10-CM | POA: Diagnosis not present

## 2022-10-06 MED ORDER — NITROGLYCERIN 0.4 MG SL SUBL: 0.4000 mg | SUBLINGUAL_TABLET | SUBLINGUAL | 3 refills | Status: AC | PRN

## 2022-10-06 NOTE — Patient Instructions (Signed)
Medication Instructions:   START Nitro  one (1) tablet by mouth (0.4 mg) sublingual.  If a single episode of chest pain is not relieved by one tablet, the patient will try another within 5 minutes; and if this doesn't relieve the pain, the patient will try another within 5 minutes and if this doesn't relieve the pain the patient is instructed to call 911 for transportation to an emergency department.  *If you need a refill on your cardiac medications before your next appointment, please call your pharmacy*   Lab Work:  None ordered.  If you have labs (blood work) drawn today and your tests are completely normal, you will receive your results only by: [place] (if you have MyChart) OR A paper copy in the mail If you have any lab test that is abnormal or we need to change your treatment, we will call you to review the results.   Testing/Procedures:  None ordered.   Follow-Up: At [facility], you and your health needs are our priority.  As part of our continuing mission to provide you with exceptional heart care, we have created designated Provider Care Teams.  These Care Teams include your primary Cardiologist (physician) and Advanced Practice Providers (APPs -  Physician Assistants and Nurse Practitioners) who all work together to provide you with the care you need, when you need it.  We recommend signing up for the patient portal called "MyChart".  Sign up information is provided on this After Visit Summary.  MyChart is used to connect with patients for Virtual Visits (Telemedicine).  Patients are able to view lab/test results, encounter notes, upcoming appointments, etc.  Non-urgent messages can be sent to your provider as well.   To learn more about what you can do with MyChart, go to [email or web address].    Your next appointment:   1 week(s)  Provider:   [facility]     Other Instructions  You have been referred to Pharmacist for discussion of your  Lipids.

## 2022-10-06 NOTE — Telephone Encounter (Signed)
Called [facility] @ [phone] pt's PCP to find out when pt had labs drawn. Oct of 22.

## 2022-10-07 ENCOUNTER — Other Ambulatory Visit (HOSPITAL_BASED_OUTPATIENT_CLINIC_OR_DEPARTMENT_OTHER): Payer: Self-pay | Admitting: Family Medicine

## 2022-10-07 DIAGNOSIS — R059 Cough, unspecified: Secondary | ICD-10-CM

## 2022-10-11 ENCOUNTER — Telehealth (HOSPITAL_COMMUNITY): Payer: Self-pay

## 2022-10-11 NOTE — Telephone Encounter (Signed)
Called to confirm Heart & Vascular Transitions of Care appointment at 10/12/22. Patient reminded to bring all medications and pill box organizer with them. Confirmed patient has transportation. Gave directions, instructed to utilize [place] parking.  Confirmed appointment prior to ending call.

## 2022-10-12 ENCOUNTER — Other Ambulatory Visit (HOSPITAL_COMMUNITY): Payer: Self-pay

## 2022-10-12 ENCOUNTER — Ambulatory Visit
Admission: RE | Admit: 2022-10-12 | Discharge: 2022-10-12 | Disposition: A | Discharge status: Home / Self Care | Payer: BC Managed Care – PPO | Source: Ambulatory Visit | Attending: Adult Health | Admitting: Adult Health

## 2022-10-12 ENCOUNTER — Ambulatory Visit: Payer: BC Managed Care – PPO

## 2022-10-12 ENCOUNTER — Other Ambulatory Visit (HOSPITAL_COMMUNITY): Payer: Self-pay | Admitting: Adult Health

## 2022-10-12 ENCOUNTER — Encounter (HOSPITAL_COMMUNITY): Payer: Self-pay

## 2022-10-12 VITALS — BP 102/60 | HR 101 | Ht 70.0 in | Wt 188.0 lb

## 2022-10-12 DIAGNOSIS — I5022 Chronic systolic (congestive) heart failure: Secondary | ICD-10-CM | POA: Diagnosis not present

## 2022-10-12 DIAGNOSIS — I469 Cardiac arrest, cause unspecified: Secondary | ICD-10-CM

## 2022-10-12 DIAGNOSIS — I2510 Atherosclerotic heart disease of native coronary artery without angina pectoris: Secondary | ICD-10-CM

## 2022-10-12 DIAGNOSIS — R9431 Abnormal electrocardiogram [ECG] [EKG]: Secondary | ICD-10-CM | POA: Insufficient documentation

## 2022-10-12 MED ORDER — CLOPIDOGREL BISULFATE 75 MG PO TABS: 75.0000 mg | ORAL_TABLET | Freq: Every day | ORAL | 3 refills | Status: DC

## 2022-10-12 MED ORDER — DAPAGLIFLOZIN PROPANEDIOL 10 MG PO TABS: 10.0000 mg | ORAL_TABLET | Freq: Every day | ORAL | 3 refills | Status: DC

## 2022-10-12 MED ORDER — FAMOTIDINE 20 MG PO TABS: 20.0000 mg | ORAL_TABLET | Freq: Two times a day (BID) | ORAL | 3 refills | Status: DC

## 2022-10-12 MED ORDER — SPIRONOLACTONE 25 MG PO TABS: 25.0000 mg | ORAL_TABLET | Freq: Every day | ORAL | 3 refills | Status: DC

## 2022-10-12 MED ORDER — CARVEDILOL 12.5 MG PO TABS: 12.5000 mg | ORAL_TABLET | Freq: Two times a day (BID) | ORAL | 3 refills | Status: DC

## 2022-10-12 MED ORDER — SACUBITRIL-VALSARTAN 24-26 MG PO TABS: 1.0000 | ORAL_TABLET | Freq: Two times a day (BID) | ORAL | 3 refills | Status: DC

## 2022-10-12 NOTE — Patient Instructions (Signed)
No change in medications. Will send refills on your current meds to [facility]. Will try to get you started earlier in Cardiac Rehab.  Return to see Dr. Haroldine Laws in [facility] in 6 weeks.

## 2022-10-12 NOTE — Progress Notes (Signed)
Per Dr. Haroldine Laws:  Contacted Cardiac Rehab asking for asap schedule for patient to begin Rehab.

## 2022-10-12 NOTE — Progress Notes (Signed)
HEART & VASCULAR TRANSITION OF CARE CONSULT NOTE     Referring Physician: Dr Martinique  Primary Care: Dr Harrington Challenger Primary Cardiologist:Dr Johnsie Cancel   HPI: Referred to clinic by Dr Martinique  for heart failure consultation.   Mr Ackers is a 57 year old with a history of GERD, HLD, DMII, frequent UTIN renal stones, ICM, STEMI 08/2022, and newly reduced EF  Father, Grandfather uncle had premature CAD. Uncle died sudden cardiac death.   Admitted 2022/09/28 with STEMI--> V Fib arrest. Shocked 7 times given 6 rounds of Epi. Prolonged arrest. HS Trop 28>24,000. Lactic acid >9.  Had cath 48 hours after the event with single vessel coronary disease mid RCA 100%, LAD proximal 50%, and LCx 20%. Started on GDMT, aspirin, plavix and high intensity statin.  Discharged 09/28/22 with Life Vest.   He was seen 10/06/22 by [facility] APP.  Stable at that time. No medication changes.   Today he presents with his wife. Complaining of rib discomfort. Wearing Life Vest. Able to walk up 9 steps at home. . Denies SOB/PND/Orthopnea.  No chest pain. Walked 2 miles. Walking 10-15 minutes per day. Appetite ok. No fever or chills. Weight at home 179-182  pounds. Taking all medications. Lives with his wife.   Cardiac Testing  Echo 08/2022  EF 30% RV moderately reduced. Grade IDD  Cath 08/2022 Severe single-vessel coronary artery disease; culprit lesion for the patient's inferior STEMI and cardiac arrest is likely 100% occlusion of mid RCA.  There is moderate (50-60%) disease involving the proximal LAD and mild (20%) stenosis of the proximal LCx. Mildly elevated left ventricular filling pressure (LVEDP 20 mmHg).  Recommendations: Continue aggressive medical therapy; defer PCI to occluded mid RCA given STEMI onset was ~48 hours ago and patient is without residual angina or shock. Dual antiplatelet therapy with aspirin and clopidogrel for 12 months. Escalate goal-directed medical therapy for acute HFrEF, as tolerated  07/26/22 ETT- no  ischemia   2021 CT FFR no significant stenosis Review of Systems: [y] = yes, [ ]$  = no   General: Weight gain [ ]$ ; Weight loss [ ]$ ; Anorexia [ ]$ ; Fatigue [ ]$ ; Fever [ ]$ ; Chills [ ]$ ; Weakness [ ]$   Cardiac: Chest pain/pressure [ ]$ ; Resting SOB [ ]$ ; Exertional SOB [ ]$ ; Orthopnea [ ]$ ; Pedal Edema [ ]$ ; Palpitations [ ]$ ; Syncope [ ]$ ; Presyncope [ ]$ ; Paroxysmal nocturnal dyspnea[ ]$   Pulmonary: Cough [ ]$ ; Wheezing[ ]$ ; Hemoptysis[ ]$ ; Sputum [ ]$ ; Snoring [ ]$   GI: Vomiting[ ]$ ; Dysphagia[ ]$ ; Melena[ ]$ ; Hematochezia [ ]$ ; Heartburn[ ]$ ; Abdominal pain [ ]$ ; Constipation [ ]$ ; Diarrhea [ ]$ ; BRBPR [ ]$   GU: Hematuria[ ]$ ; Dysuria [ ]$ ; Nocturia[ ]$   Vascular: Pain in legs with walking [ ]$ ; Pain in feet with lying flat [ ]$ ; Non-healing sores [ ]$ ; Stroke [ ]$ ; TIA [ ]$ ; Slurred speech [ ]$ ;  Neuro: Headaches[ ]$ ; Vertigo[ ]$ ; Seizures[ ]$ ; Paresthesias[ ]$ ;Blurred vision [ ]$ ; Diplopia [ ]$ ; Vision changes [ ]$   Ortho/Skin: Arthritis [ ]$ ; Joint pain [ Y]; Muscle pain [ ]$ ; Joint swelling [ ]$ ; Back Pain [ ]$ ; Rash [ ]$   Psych: Depression[ ]$ ; Anxiety[ ]$   Heme: Bleeding problems [ ]$ ; Clotting disorders [ ]$ ; Anemia [ ]$   Endocrine: Diabetes [ Y]; Thyroid dysfunction[ ]$    Past Medical History:  Diagnosis Date   GERD (gastroesophageal reflux disease)    hx. reflux.   Hyperlipidemia    tx. with meds(high triglycerides)   Recurrent UTI 06/30/2020  Renal cyst 08/17/2020   Ventral hernia     Current Outpatient Medications  Medication Sig Dispense Refill   Accu-Chek Softclix Lancets lancets Use 1 each in the morning, at noon, and at bedtime. 100 each 0   acetaminophen (TYLENOL) 325 MG tablet Take 2 tablets (650 mg total) by mouth every 6 (six) hours as needed for moderate pain. 20 tablet 0   aspirin 81 MG chewable tablet Chew 1 tablet (81 mg total) by mouth daily. 30 tablet 2   atorvastatin (LIPITOR) 80 MG tablet Take 1 tablet (80 mg total) by mouth daily. 30 tablet 2   Blood Glucose Monitoring Suppl (ACCU-CHEK GUIDE) w/Device KIT  Use in the morning, at noon, and at bedtime. 1 kit 0   Blood Pressure KIT 1 Units by Does not apply route daily as needed (BP). 1 kit 0   carvedilol (COREG) 12.5 MG tablet Take 1 tablet (12.5 mg total) by mouth 2 (two) times daily with a meal. 60 tablet 2   Cholecalciferol (VITAMIN D3) 25 MCG (1000 UT) CAPS Take 1,000 Units by mouth daily.     clopidogrel (PLAVIX) 75 MG tablet Take 1 tablet (75 mg total) by mouth daily with breakfast. 30 tablet 2   dapagliflozin propanediol (FARXIGA) 10 MG TABS tablet Take 1 tablet (10 mg total) by mouth daily. 30 tablet 0   famotidine (PEPCID) 20 MG tablet Take 1 tablet (20 mg total) by mouth 2 (two) times daily. 30 tablet 0   glucose blood test strip Use 1 each in the morning, at noon, and at bedtime. 100 each 0   hydrOXYzine (ATARAX) 10 MG tablet Take 10 mg by mouth 3 (three) times daily as needed.     insulin aspart (NOVOLOG) 100 UNIT/ML FlexPen Before each meal 3 times a day, 140-199 - 2 units, 200-250 - 4 units, 251-299 - 6 units,  300-349 - 8 units,  350 or above 10 units. 15 mL 0   insulin detemir (LEVEMIR) 100 UNIT/ML FlexPen Inject 15 Units into the skin 2 (two) times daily. 15 mL 0   Insulin Pen Needle 32G X 4 MM MISC Use with Levemir and Novolog flexpen 100 each 0   Lancets Misc. (ACCU-CHEK FASTCLIX LANCET) KIT Use in the morning, at noon, and at bedtime. 1 kit 0   Multiple Vitamins-Minerals (MULTI COMPLETE) CAPS Take 1 tablet by mouth daily.     sacubitril-valsartan (ENTRESTO) 24-26 MG Take 1 tablet by mouth 2 (two) times daily. 60 tablet 0   spironolactone (ALDACTONE) 25 MG tablet Take 1 tablet (25 mg total) by mouth daily. 30 tablet 0   tiZANidine (ZANAFLEX) 2 MG tablet Take by mouth.     traZODone (DESYREL) 50 MG tablet Take 50 mg by mouth at bedtime.     nitroGLYCERIN (NITROSTAT) 0.4 MG SL tablet Place 1 tablet (0.4 mg total) under the tongue every 5 (five) minutes as needed for chest pain. (Patient not taking: Reported on 10/12/2022) 25 tablet 3    No current facility-administered medications for this encounter.    Allergies  Allergen Reactions   Macrobid [Nitrofurantoin] Other (See Comments)   Morphine Nausea And Vomiting and Other (See Comments)   Niacin Rash      Social History   Socioeconomic History   Marital status: Married    Spouse name: Sunday Spillers   Number of children: 2   Years of education: Not on file   Highest education level: Bachelor's degree (e.g., BA, AB, BS)  Occupational History   Occupation:  [employer]  Tobacco Use   Smoking status: Never   Smokeless tobacco: Never  Vaping Use   Vaping Use: Never used  Substance and Sexual Activity   Alcohol use: No   Drug use: No   Sexual activity: Not Currently  Other Topics Concern   Not on file  Social History Narrative   Not on file   Social Determinants of Health   Financial Resource Strain: Low Risk  (09/28/2022)   Overall Financial Resource Strain (CARDIA)    Difficulty of Paying Living Expenses: Not very hard  Food Insecurity: No Food Insecurity (09/28/2022)   Hunger Vital Sign    Worried About Running Out of Food in the Last Year: Never true    Ran Out of Food in the Last Year: Never true  Transportation Needs: No Transportation Needs (09/28/2022)   PRAPARE - Hydrologist (Medical): No    Lack of Transportation (Non-Medical): No  Physical Activity: Not on file  Stress: Not on file  Social Connections: Not on file  Intimate Partner Violence: Not on file      Family History  Problem Relation Age of Onset   Heart disease Father    Heart disease Paternal Uncle     Vitals:   10/12/22 0834  BP: 102/60  Pulse: (!) 101  SpO2: 96%  Weight: 85.3 kg (188 lb)  Height: 5' 10"$  (1.778 m)   Wt Readings from Last 3 Encounters:  10/12/22 85.3 kg (188 lb)  10/06/22 84.1 kg (185 lb 6.4 oz)  09/28/22 89.3 kg (196 lb 13.9 oz)     PHYSICAL EXAM: General: Wearing Life Vest.  No respiratory difficulty HEENT:  normal Neck: supple. no JVD. Carotids 2+ bilat; no bruits. No lymphadenopathy or thryomegaly appreciated. Cor: PMI nondisplaced. Regular rate & rhythm. No rubs, gallops or murmurs. Lungs: clear Abdomen: soft, nontender, nondistended. No hepatosplenomegaly. No bruits or masses. Good bowel sounds. Extremities: no cyanosis, clubbing, rash, edema Neuro: alert & oriented x 3, cranial nerves grossly intact. moves all 4 extremities w/o difficulty. Affect pleasant.  ECG:SR 95 bpm    ASSESSMENT & PLAN: 1. Chronic HFrEF, ICM  Echo LVEF 30% with WMA,  RV moderately reduced. Strong family history premature coronary disease -->grandfather, father, uncle. His Uncle had SCD.  NYHA II.  GDMT  Diuretic- Volume status stable. He does not need loop diuretics.  BB- Continue coreg 12.5 mg twice a day Ace/ARB/ARNI- Continue entresto 24-26 mg twice a day  MRA- Continue spironolactone 25 mg daily  SGLT2i- Continue farxiga 10 mg daily No room to titrate meds. SBP at home soft 70-90s.  Continue Life Vest until ECHO repeated in 3 months. If EF < 35% will need referral to EP. Discussed.   2. CAD -Strong family history as noted above.  -Cath 2024 occluded mid RCA. Nonobstructive disease in the mid LAD. Plan medical management. - Continue aspirin + high intensity statin+ coreg.   - No chest pain. - Started cardiac rehab soon.   3. DMII Hgb A1C 10.8  Has close follow up with PCP.   4. V Fib Arrest in the setting STEMI -Continue Life Vest . No events  - IF EF remains <35% will need referral to EP. Discussed.    He had labs earlier today at [facility]. Results pending.    Referred to HFSW (PCP, Medications, Transportation, ETOH Abuse, Drug Abuse, Insurance, Financial ): No Refer to Pharmacy:  No Refer to Home Health:  No Refer to Advanced Heart  Failure Clinic: Yes---> Dr Haroldine Laws Refer to General Cardiology: Shared Dr Johnsie Cancel  Follow up  6 weeks with Dr Haroldine Laws  Amy CleggNP-C  11:09 AM  Patient seen  and examined with the above-signed Advanced Practice Provider and/or Housestaff. I personally reviewed laboratory data, imaging studies and relevant notes. I independently examined the patient and formulated the important aspects of the plan. I have edited the note to reflect any of my changes or salient points. I have personally discussed the plan with the patient and/or family.  57 y/o male as above with recent cardiac arrest in setting RCA infarct. Has mad a miraculous recovery. Doing well. Remains active. Fully neurologically intact. Wearing LifeVest. NYHA II. No CP. Volume ok. EF 30% at time of infarct  General:  Well appearing. No resp difficulty HEENT: normal Neck: supple. no JVD. Carotids 2+ bilat; no bruits. No lymphadenopathy or thryomegaly appreciated. Cor: PMI nondisplaced. Regular rate & rhythm. No rubs, gallops or murmurs. Lungs: clear Abdomen: soft, nontender, nondistended. No hepatosplenomegaly. No bruits or masses. Good bowel sounds. Extremities: no cyanosis, clubbing, rash, edema Neuro: alert & orientedx3, cranial nerves grossly intact. moves all 4 extremities w/o difficulty. Affect pleasant  Doing remarkably well. NYHA II. Volume ok. Wearing Lifevest. On good meds.   Refer CR. Will need repeat echo 3 months post-MI to decide on ICD.   Glori Bickers, MD  3:55 PM

## 2022-10-13 ENCOUNTER — Other Ambulatory Visit: Payer: Self-pay | Admitting: *Deleted

## 2022-10-13 ENCOUNTER — Other Ambulatory Visit: Payer: Self-pay

## 2022-10-13 DIAGNOSIS — I2510 Atherosclerotic heart disease of native coronary artery without angina pectoris: Secondary | ICD-10-CM

## 2022-10-13 DIAGNOSIS — E782 Mixed hyperlipidemia: Secondary | ICD-10-CM

## 2022-10-13 LAB — COMPREHENSIVE METABOLIC PANEL
ALT: 20 IU/L (ref 0–44)
AST: 19 IU/L (ref 0–40)
Albumin/Globulin Ratio: 1.5 (ref 1.2–2.2)
Albumin: 4.6 g/dL (ref 3.8–4.9)
Alkaline Phosphatase: 156 IU/L — ABNORMAL HIGH (ref 44–121)
BUN/Creatinine Ratio: 26 — ABNORMAL HIGH (ref 9–20)
BUN: 29 mg/dL — ABNORMAL HIGH (ref 6–24)
Bilirubin Total: 0.4 mg/dL (ref 0.0–1.2)
CO2: 26 mmol/L (ref 20–29)
Calcium: 10.3 mg/dL — ABNORMAL HIGH (ref 8.7–10.2)
Chloride: 99 mmol/L (ref 96–106)
Creatinine, Ser: 1.13 mg/dL (ref 0.76–1.27)
Globulin, Total: 3 g/dL (ref 1.5–4.5)
Glucose: 145 mg/dL — ABNORMAL HIGH (ref 70–99)
Potassium: 4.9 mmol/L (ref 3.5–5.2)
Sodium: 135 mmol/L (ref 134–144)
Total Protein: 7.6 g/dL (ref 6.0–8.5)
eGFR: 75 mL/min/{1.73_m2} (ref >59)

## 2022-10-14 ENCOUNTER — Other Ambulatory Visit: Payer: Self-pay | Admitting: *Deleted

## 2022-10-14 DIAGNOSIS — R748 Abnormal levels of other serum enzymes: Secondary | ICD-10-CM

## 2022-10-14 DIAGNOSIS — R7989 Other specified abnormal findings of blood chemistry: Secondary | ICD-10-CM

## 2022-10-17 ENCOUNTER — Encounter (HOSPITAL_COMMUNITY): Payer: Self-pay | Admitting: Internal Medicine

## 2022-10-20 LAB — COMPREHENSIVE METABOLIC PANEL
ALT: 18 IU/L (ref 0–44)
AST: 24 IU/L (ref 0–40)
Alkaline Phosphatase: 194 IU/L — ABNORMAL HIGH (ref 44–121)
BUN/Creatinine Ratio: 20 (ref 9–20)
BUN: 33 mg/dL — ABNORMAL HIGH (ref 6–24)
Bilirubin Total: <0.2 mg/dL (ref 0.0–1.2)
CO2: 17 mmol/L — ABNORMAL LOW (ref 20–29)
Calcium: 11.5 mg/dL — ABNORMAL HIGH (ref 8.7–10.2)
Creatinine, Ser: 1.61 mg/dL — ABNORMAL HIGH (ref 0.76–1.27)
Glucose: 161 mg/dL — ABNORMAL HIGH (ref 70–99)
Total Protein: 10 g/dL — ABNORMAL HIGH (ref 6.0–8.5)
eGFR: 49 mL/min/{1.73_m2} — ABNORMAL LOW (ref >59)

## 2022-10-20 LAB — CBC
Hematocrit: 39.4 % (ref 37.5–51.0)
Hemoglobin: 13.5 g/dL (ref 13.0–17.7)
MCH: 31 pg (ref 26.6–33.0)
MCHC: 34.3 g/dL (ref 31.5–35.7)
MCV: 91 fL (ref 79–97)
Platelets: 562 10*3/uL — ABNORMAL HIGH (ref 150–450)
RBC: 4.35 x10E6/uL (ref 4.14–5.80)
RDW: 12.9 % (ref 11.6–15.4)
WBC: 7.7 10*3/uL (ref 3.4–10.8)

## 2022-10-20 LAB — PRO B NATRIURETIC PEPTIDE: NT-Pro BNP: 330 pg/mL — ABNORMAL HIGH (ref 0–210)

## 2022-10-20 LAB — APOLIPOPROTEIN B: Apolipoprotein B: 63 mg/dL (ref <90)

## 2022-10-24 ENCOUNTER — Encounter (HOSPITAL_COMMUNITY): Payer: Self-pay

## 2022-10-25 ENCOUNTER — Other Ambulatory Visit (HOSPITAL_COMMUNITY): Payer: Self-pay

## 2022-10-25 ENCOUNTER — Encounter: Payer: Self-pay | Admitting: Nurse Practitioner

## 2022-10-25 MED ORDER — ATORVASTATIN CALCIUM 80 MG PO TABS: 80.0000 mg | ORAL_TABLET | Freq: Every day | ORAL | 2 refills | Status: DC

## 2022-10-27 DIAGNOSIS — I420 Dilated cardiomyopathy: Secondary | ICD-10-CM | POA: Diagnosis not present

## 2022-10-27 DIAGNOSIS — I213 ST elevation (STEMI) myocardial infarction of unspecified site: Secondary | ICD-10-CM | POA: Diagnosis not present

## 2022-10-28 ENCOUNTER — Ambulatory Visit: Payer: BC Managed Care – PPO | Attending: Nurse Practitioner

## 2022-10-28 DIAGNOSIS — R748 Abnormal levels of other serum enzymes: Secondary | ICD-10-CM | POA: Diagnosis not present

## 2022-10-28 DIAGNOSIS — R7989 Other specified abnormal findings of blood chemistry: Secondary | ICD-10-CM

## 2022-10-29 ENCOUNTER — Other Ambulatory Visit (HOSPITAL_COMMUNITY): Payer: Self-pay | Admitting: Internal Medicine

## 2022-10-29 LAB — HEPATIC FUNCTION PANEL
ALT: 25 IU/L (ref 0–44)
AST: 19 IU/L (ref 0–40)
Albumin: 4.7 g/dL (ref 3.8–4.9)
Alkaline Phosphatase: 106 IU/L (ref 44–121)
Bilirubin Total: 0.4 mg/dL (ref 0.0–1.2)
Bilirubin, Direct: 0.13 mg/dL (ref 0.00–0.40)
Total Protein: 7.8 g/dL (ref 6.0–8.5)

## 2022-10-29 LAB — CBC
Hematocrit: 41.6 % (ref 37.5–51.0)
Hemoglobin: 14 g/dL (ref 13.0–17.7)
MCH: 30.4 pg (ref 26.6–33.0)
MCHC: 33.7 g/dL (ref 31.5–35.7)
MCV: 90 fL (ref 79–97)
Platelets: 267 10*3/uL (ref 150–450)
RBC: 4.6 x10E6/uL (ref 4.14–5.80)
RDW: 13.2 % (ref 11.6–15.4)
WBC: 6.8 10*3/uL (ref 3.4–10.8)

## 2022-11-01 ENCOUNTER — Telehealth (HOSPITAL_COMMUNITY): Payer: Self-pay

## 2022-11-01 ENCOUNTER — Encounter (HOSPITAL_COMMUNITY): Payer: Self-pay

## 2022-11-01 NOTE — Telephone Encounter (Signed)
Attempted to call patient in regards to Cardiac Rehab - LM on VM Mailed letter 

## 2022-11-01 NOTE — Telephone Encounter (Signed)
Pt insurance is active and benefits verified through [facility]. Co-pay $0.00, DED $3,500.00/$3,500.00 met, out of pocket $8,700.00/$6,704.58 met, co-insurance 30%. No pre-authorization required. Passport, 11/01/2022 @ 3:08PM, [id number]   How many CR sessions are covered? (36 sessions for TCR, 72 sessions for ICR)72 Is this a lifetime maximum or an annual maximum? Lifetime Has the member used any of these services to date? No Is there a time limit (weeks/months) on start of program and/or program completion? No     Will contact patient to see if he is interested in the Cardiac Rehab Program.

## 2022-11-15 ENCOUNTER — Ambulatory Visit: Payer: BC Managed Care – PPO | Attending: Family Medicine

## 2022-11-15 NOTE — Progress Notes (Unsigned)
Patient ID: Brian [name]                 DOB: 30-Dec-1965                    MRN: [id number]      HPI: Brian [name] is a 57 y.o. male patient referred to lipid clinic by  Brian Bame, NP. PMH is significant for levated calcium score with moderate LAD lesion but negative FFR on cardiac CT 01/2020, HLD, type 2 DM, family hx early CAD, GERD, CAD s/p inferior STEMI and V-fib cardiac arrest, and ICM, HFrEF  V tach cardiac arrest due to inferior STEMI 09/24/2022.   At visit 10/06/2022 with Brian Bame, NP patient reported significant chest discomfort with breathing and not being able to sleep due to pain/discomfort in chest, right side of the neck, and right shoulder. Reported improving his diet to plant based. Most recent direct LDL from 09/26/22 was 21.9, TG 428, HDL 18, ApoB 63 (10/12/2022). A1c 10.8  Current Medications: atorvastatin 80mg  Intolerances: niacin (rash) Risk Factors: T2DM, STEMI LDL-C goal: < 55 ApoB goal: < 70  Diet:   Exercise:   Family History: heart disease in father and paternal uncle   Social History: Never smoked, no alcohol use, no drug use.   Labs: Lipid Panel     Component Value Date/Time   CHOL 107 09/26/2022 0338   TRIG 428 (H) 09/26/2022 0338   HDL 18 (L) 09/26/2022 0338   CHOLHDL 5.9 09/26/2022 0338   VLDL UNABLE TO CALCULATE IF TRIGLYCERIDE OVER 400 mg/dL 09/26/2022 0338   LDLCALC UNABLE TO CALCULATE IF TRIGLYCERIDE OVER 400 mg/dL 09/26/2022 0338   LDLDIRECT 21.9 09/26/2022 0338    Past Medical History:  Diagnosis Date   GERD (gastroesophageal reflux disease)    hx. reflux.   Hyperlipidemia    tx. with meds(high triglycerides)   Recurrent UTI 06/30/2020   Renal cyst 08/17/2020   Ventral hernia     Current Outpatient Medications on File Prior to Visit  Medication Sig Dispense Refill   acetaminophen (TYLENOL) 325 MG tablet Take 2 tablets (650 mg total) by mouth every 6 (six) hours as needed for moderate pain. 20 tablet 0   aspirin  81 MG chewable tablet Chew 1 tablet (81 mg total) by mouth daily. 30 tablet 2   atorvastatin (LIPITOR) 80 MG tablet TAKE 1 TABLET BY MOUTH EVERY DAY 90 tablet 0   Blood Glucose Monitoring Suppl (ACCU-CHEK GUIDE) w/Device KIT Use in the morning, at noon, and at bedtime. 1 kit 0   Blood Pressure KIT 1 Units by Does not apply route daily as needed (BP). 1 kit 0   carvedilol (COREG) 12.5 MG tablet Take 1 tablet (12.5 mg total) by mouth 2 (two) times daily with a meal. 180 tablet 3   Cholecalciferol (VITAMIN D3) 25 MCG (1000 UT) CAPS Take 1,000 Units by mouth daily.     clopidogrel (PLAVIX) 75 MG tablet Take 1 tablet (75 mg total) by mouth daily with breakfast. 90 tablet 3   dapagliflozin propanediol (FARXIGA) 10 MG TABS tablet Take 1 tablet (10 mg total) by mouth daily. 90 tablet 3   famotidine (PEPCID) 20 MG tablet Take 1 tablet (20 mg total) by mouth 2 (two) times daily. 90 tablet 3   glucose blood test strip Use 1 each in the morning, at noon, and at bedtime. 100 each 0   hydrOXYzine (ATARAX) 10 MG tablet Take 10 mg by mouth 3 (three)  times daily as needed.     insulin aspart (NOVOLOG) 100 UNIT/ML FlexPen Before each meal 3 times a day, 140-199 - 2 units, 200-250 - 4 units, 251-299 - 6 units,  300-349 - 8 units,  350 or above 10 units. 15 mL 0   insulin detemir (LEVEMIR) 100 UNIT/ML FlexPen Inject 15 Units into the skin 2 (two) times daily. 15 mL 0   Insulin Pen Needle 32G X 4 MM MISC Use with Levemir and Novolog flexpen 100 each 0   Multiple Vitamins-Minerals (MULTI COMPLETE) CAPS Take 1 tablet by mouth daily.     nitroGLYCERIN (NITROSTAT) 0.4 MG SL tablet Place 1 tablet (0.4 mg total) under the tongue every 5 (five) minutes as needed for chest pain. (Patient not taking: Reported on 10/12/2022) 25 tablet 3   sacubitril-valsartan (ENTRESTO) 24-26 MG Take 1 tablet by mouth 2 (two) times daily. 180 tablet 3   spironolactone (ALDACTONE) 25 MG tablet Take 1 tablet (25 mg total) by mouth daily. 45 tablet 3    tiZANidine (ZANAFLEX) 2 MG tablet Take by mouth.     traZODone (DESYREL) 50 MG tablet Take 50 mg by mouth at bedtime.     No current facility-administered medications on file prior to visit.    Allergies  Allergen Reactions   Macrobid [Nitrofurantoin] Other (See Comments)   Morphine Nausea And Vomiting and Other (See Comments)   Niacin Rash    Assessment/Plan:  1. Hyperlipidemia -  No problem-specific Assessment & Plan notes found for this encounter.    Thank you,  Ramond Dial, Pharm.D, BCPS, CPP [facility] [address] [place],  [address]  Phone: [phone]; Fax: [phone]

## 2022-11-21 DIAGNOSIS — R899 Unspecified abnormal finding in specimens from other organs, systems and tissues: Secondary | ICD-10-CM | POA: Diagnosis not present

## 2022-11-21 DIAGNOSIS — I959 Hypotension, unspecified: Secondary | ICD-10-CM | POA: Diagnosis not present

## 2022-11-21 DIAGNOSIS — E782 Mixed hyperlipidemia: Secondary | ICD-10-CM | POA: Diagnosis not present

## 2022-11-21 DIAGNOSIS — E119 Type 2 diabetes mellitus without complications: Secondary | ICD-10-CM | POA: Diagnosis not present

## 2022-11-21 DIAGNOSIS — E162 Hypoglycemia, unspecified: Secondary | ICD-10-CM | POA: Diagnosis not present

## 2022-11-21 DIAGNOSIS — Z125 Encounter for screening for malignant neoplasm of prostate: Secondary | ICD-10-CM | POA: Diagnosis not present

## 2022-11-27 DIAGNOSIS — I420 Dilated cardiomyopathy: Secondary | ICD-10-CM | POA: Diagnosis not present

## 2022-11-27 DIAGNOSIS — I213 ST elevation (STEMI) myocardial infarction of unspecified site: Secondary | ICD-10-CM | POA: Diagnosis not present

## 2022-12-14 ENCOUNTER — Ambulatory Visit (HOSPITAL_COMMUNITY)
Admission: RE | Admit: 2022-12-14 | Discharge: 2022-12-14 | Disposition: A | Discharge status: Home / Self Care | Payer: BC Managed Care – PPO | Source: Ambulatory Visit | Attending: Internal Medicine | Admitting: Internal Medicine

## 2022-12-14 ENCOUNTER — Encounter (HOSPITAL_COMMUNITY): Payer: Self-pay | Admitting: Internal Medicine

## 2022-12-14 VITALS — BP 88/50 | HR 80 | Wt 182.4 lb

## 2022-12-14 DIAGNOSIS — Z8674 Personal history of sudden cardiac arrest: Secondary | ICD-10-CM | POA: Insufficient documentation

## 2022-12-14 DIAGNOSIS — I469 Cardiac arrest, cause unspecified: Secondary | ICD-10-CM | POA: Diagnosis not present

## 2022-12-14 DIAGNOSIS — I252 Old myocardial infarction: Secondary | ICD-10-CM | POA: Insufficient documentation

## 2022-12-14 DIAGNOSIS — E119 Type 2 diabetes mellitus without complications: Secondary | ICD-10-CM | POA: Insufficient documentation

## 2022-12-14 DIAGNOSIS — Z8249 Family history of ischemic heart disease and other diseases of the circulatory system: Secondary | ICD-10-CM | POA: Diagnosis not present

## 2022-12-14 DIAGNOSIS — Z79899 Other long term (current) drug therapy: Secondary | ICD-10-CM | POA: Diagnosis not present

## 2022-12-14 DIAGNOSIS — Z7982 Long term (current) use of aspirin: Secondary | ICD-10-CM | POA: Diagnosis not present

## 2022-12-14 DIAGNOSIS — R42 Dizziness and giddiness: Secondary | ICD-10-CM | POA: Insufficient documentation

## 2022-12-14 DIAGNOSIS — I4901 Ventricular fibrillation: Secondary | ICD-10-CM | POA: Insufficient documentation

## 2022-12-14 DIAGNOSIS — Z7902 Long term (current) use of antithrombotics/antiplatelets: Secondary | ICD-10-CM | POA: Diagnosis not present

## 2022-12-14 DIAGNOSIS — I2510 Atherosclerotic heart disease of native coronary artery without angina pectoris: Secondary | ICD-10-CM

## 2022-12-14 DIAGNOSIS — Z87440 Personal history of urinary (tract) infections: Secondary | ICD-10-CM | POA: Insufficient documentation

## 2022-12-14 DIAGNOSIS — E781 Pure hyperglyceridemia: Secondary | ICD-10-CM | POA: Insufficient documentation

## 2022-12-14 DIAGNOSIS — Z87442 Personal history of urinary calculi: Secondary | ICD-10-CM | POA: Diagnosis not present

## 2022-12-14 DIAGNOSIS — I5022 Chronic systolic (congestive) heart failure: Secondary | ICD-10-CM | POA: Diagnosis not present

## 2022-12-14 DIAGNOSIS — K219 Gastro-esophageal reflux disease without esophagitis: Secondary | ICD-10-CM | POA: Insufficient documentation

## 2022-12-14 LAB — BASIC METABOLIC PANEL
Anion gap: 10 (ref 5–15)
BUN: 17 mg/dL (ref 6–20)
CO2: 24 mmol/L (ref 22–32)
Calcium: 9.2 mg/dL (ref 8.9–10.3)
Chloride: 101 mmol/L (ref 98–111)
Creatinine, Ser: 1.05 mg/dL (ref 0.61–1.24)
GFR, Estimated: >60 mL/min (ref >60)
Glucose, Bld: 124 mg/dL — ABNORMAL HIGH (ref 70–99)
Potassium: 4 mmol/L (ref 3.5–5.1)
Sodium: 135 mmol/L (ref 135–145)

## 2022-12-14 LAB — BRAIN NATRIURETIC PEPTIDE: B Natriuretic Peptide: 34.3 pg/mL (ref 0.0–100.0)

## 2022-12-14 NOTE — Progress Notes (Signed)
HEART & VASCULAR TRANSITION OF CARE CONSULT NOTE     Referring Physician: Dr [place]  Primary Care: Dr Tenny Craw Primary Cardiologist:Dr Eden Emms   HPI:  Brian [name] is a 57 year old with a history of DM2,frequent UTIN renal stones, ICM, STEMI 08/2022, and newly reduced EF  Father, Grandfather uncle had premature CAD. Uncle died sudden cardiac death.   Admitted 09/29/22 with STEMI--> V Fib arrest. Shocked 7 times given 6 rounds of Epi. Prolonged arrest. HS Trop 28>24,000. Lactic acid >9.  Had cath 48 hours after the event with single vessel coronary disease mid RCA 100%, LAD proximal 50%, and LCx 20%. Started on GDMT, aspirin, plavix and high intensity statin.  Discharged 09/28/22 with Life Vest.   Here for f/u with his wife. Feels great. Exercising every day without CP or SOB. Went to [employer] and walked 9 miles. Complaint with all meds. Not wearing LifeVest. SBP 89-116 (mostly in 90s). Occasional dizziness with standing. Has lost 30 pounds   Cardiac Testing  Echo 08/2022  EF 30% RV moderately reduced. Grade IDD  Cath 08/2022 Severe single-vessel coronary artery disease; culprit lesion for the patient's inferior STEMI and cardiac arrest is likely 100% occlusion of mid RCA.  There is moderate (50-60%) disease involving the proximal LAD and mild (20%) stenosis of the proximal LCx. Mildly elevated left ventricular filling pressure (LVEDP 20 mmHg).  Recommendations: Continue aggressive medical therapy; defer PCI to occluded mid RCA given STEMI onset was ~48 hours ago and patient is without residual angina or shock. Dual antiplatelet therapy with aspirin and clopidogrel for 12 months. Escalate goal-directed medical therapy for acute HFrEF, as tolerated  07/26/22 ETT- no ischemia   2021 CT FFR no significant stenosis   Past Medical History:  Diagnosis Date   GERD (gastroesophageal reflux disease)    hx. reflux.   Hyperlipidemia    tx. with meds(high triglycerides)   Recurrent UTI  06/30/2020   Renal cyst 08/17/2020   Ventral hernia     Current Outpatient Medications  Medication Sig Dispense Refill   acetaminophen (TYLENOL) 325 MG tablet Take 2 tablets (650 mg total) by mouth every 6 (six) hours as needed for moderate pain. 20 tablet 0   aspirin 81 MG chewable tablet Chew 1 tablet (81 mg total) by mouth daily. 30 tablet 2   atorvastatin (LIPITOR) 80 MG tablet TAKE 1 TABLET BY MOUTH EVERY DAY 90 tablet 0   Blood Glucose Monitoring Suppl (ACCU-CHEK GUIDE) w/Device KIT Use in the morning, at noon, and at bedtime. 1 kit 0   Blood Pressure KIT 1 Units by Does not apply route daily as needed (BP). 1 kit 0   carvedilol (COREG) 12.5 MG tablet Take 1 tablet (12.5 mg total) by mouth 2 (two) times daily with a meal. 180 tablet 3   Cholecalciferol (VITAMIN D3) 25 MCG (1000 UT) CAPS Take 1,000 Units by mouth daily.     clopidogrel (PLAVIX) 75 MG tablet Take 1 tablet (75 mg total) by mouth daily with breakfast. 90 tablet 3   dapagliflozin propanediol (FARXIGA) 10 MG TABS tablet Take 1 tablet (10 mg total) by mouth daily. 90 tablet 3   famotidine (PEPCID) 20 MG tablet Take 1 tablet (20 mg total) by mouth 2 (two) times daily. 90 tablet 3   glucose blood test strip Use 1 each in the morning, at noon, and at bedtime. 100 each 0   hydrOXYzine (ATARAX) 10 MG tablet Take 10 mg by mouth 3 (three) times daily  as needed.     Multiple Vitamins-Minerals (MULTI COMPLETE) CAPS Take 1 tablet by mouth daily.     nitroGLYCERIN (NITROSTAT) 0.4 MG SL tablet Place 1 tablet (0.4 mg total) under the tongue every 5 (five) minutes as needed for chest pain. 25 tablet 3   sacubitril-valsartan (ENTRESTO) 24-26 MG Take 1 tablet by mouth 2 (two) times daily. 180 tablet 3   Semaglutide (RYBELSUS) 3 MG TABS Take 1 tablet by mouth daily.     spironolactone (ALDACTONE) 25 MG tablet Take 1 tablet (25 mg total) by mouth daily. 45 tablet 3   tiZANidine (ZANAFLEX) 2 MG tablet Take by mouth.     traZODone (DESYREL) 50 MG  tablet Take 50 mg by mouth as needed for sleep.     No current facility-administered medications for this encounter.    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  Tobacco Use   Smoking status: Never   Smokeless tobacco: Never  Vaping Use   Vaping Use: Never used  Substance and Sexual Activity   Alcohol use: No   Drug use: No   Sexual activity: Not Currently  Other Topics Concern   Not on file  Social History Narrative   Not on file   Social Determinants of Health   Financial Resource Strain: Low Risk  (09/28/2022)   Overall Financial Resource Strain (CARDIA)    Difficulty of Paying Living Expenses: Not very hard  Food Insecurity: No Food Insecurity (09/28/2022)   Hunger Vital Sign    Worried About Running Out of Food in the Last Year: Never true    Ran Out of Food in the Last Year: Never true  Transportation Needs: No Transportation Needs (09/28/2022)   PRAPARE - Administrator, Civil Service (Medical): No    Lack of Transportation (Non-Medical): No  Physical Activity: Not on file  Stress: Not on file  Social Connections: Not on file  Intimate Partner Violence: Not on file      Family History  Problem Relation Age of Onset   Heart disease Father    Heart disease Paternal Uncle     Vitals:   12/14/22 1042  BP: (!) 88/50  Pulse: 80  SpO2: 98%  Weight: 82.7 kg (182 lb 6.4 oz)   Wt Readings from Last 3 Encounters:  12/14/22 82.7 kg (182 lb 6.4 oz)  10/12/22 85.3 kg (188 lb)  10/06/22 84.1 kg (185 lb 6.4 oz)     PHYSICAL EXAM: neuropathy   ECG:SR 72 bpm inferior Q  Personally reviewed    ASSESSMENT & PLAN:  1. Chronic HFrEF, ICM  - Inferior STEMI with VF arrest 09/24/22 - Echo 1/24 LVEF 30% with WMA,  RV moderately reduced.  - POCUS echo today EF 40-45% - Doing great NYHA I. Volume ok.  - Continue coreg 12.5 mg twice a day - With low BP will drop entresto to 12-13 mg twice a day. May need to switch to losartan - Continue spironolactone 25 mg daily  - Continue farxiga 10 mg daily - Check formal echo  2. CAD - Inferior STEMI with VF arrest 09/24/22 - Cath 08/2022 occluded mid RCA. Nonobstructive disease in the mid LAD 50-60%.  - Continue aggressive medical management. Discussed [facility] program - Continue aspirin + high intensity statin+ coreg.   - No s/s angina  3. DMII - Much improved Hgb A1C 10.8 -> 7.0  - Continue Renato Battles MD 10:59 AM

## 2022-12-14 NOTE — Patient Instructions (Signed)
CHANGE Entresto to 12-13 mg Twice daily ( spilt tablet in half)  Labs done today, your results will be available in MyChart, we will contact you for abnormal readings.  Your physician has requested that you have an echocardiogram. Echocardiography is a painless test that uses sound waves to create images of your heart. It provides your doctor with information about the size and shape of your heart and how well your heart's chambers and valves are working. This procedure takes approximately one hour. There are no restrictions for this procedure. Please do NOT wear cologne, perfume, aftershave, or lotions (deodorant is allowed). Please arrive 15 minutes prior to your appointment time.  Your physician recommends that you schedule a follow-up appointment in: 4 months ( August ) ** please call the office in June to arrange your follow up appointment. **  If you have any questions or concerns before your next appointment please send [place] a message through [place] or call our office at [phone].    TO LEAVE A MESSAGE FOR THE NURSE SELECT OPTION 2, PLEASE LEAVE A MESSAGE INCLUDING: YOUR NAME DATE OF BIRTH CALL BACK NUMBER REASON FOR CALL**this is important as we prioritize the call backs  YOU WILL RECEIVE A CALL BACK THE SAME DAY AS LONG AS YOU CALL BEFORE 4:00 PM  At the [facility], you and your health needs are our priority. As part of our continuing mission to provide you with exceptional heart care, we have created designated Provider Care Teams. These Care Teams include your primary Cardiologist (physician) and Advanced Practice Providers (APPs- Physician Assistants and Nurse Practitioners) who all work together to provide you with the care you need, when you need it.   You may see any of the following providers on your designated Care Team at your next follow up: Dr Arvilla Meres Dr Marca Ancona Dr. Marcos Eke, NP Robbie Lis, [place] [facility] [place], [place] Brynda Peon, NP Karle Plumber, PharmD   Please be sure to bring in all your medications bottles to every appointment.    Thank you for choosing [facility]

## 2022-12-27 ENCOUNTER — Ambulatory Visit (HOSPITAL_COMMUNITY)
Admission: RE | Admit: 2022-12-27 | Discharge: 2022-12-27 | Disposition: A | Discharge status: Home / Self Care | Payer: BC Managed Care – PPO | Source: Ambulatory Visit | Attending: Cardiovascular Disease | Admitting: Cardiovascular Disease

## 2022-12-27 DIAGNOSIS — I509 Heart failure, unspecified: Secondary | ICD-10-CM | POA: Insufficient documentation

## 2022-12-27 DIAGNOSIS — I358 Other nonrheumatic aortic valve disorders: Secondary | ICD-10-CM | POA: Insufficient documentation

## 2022-12-27 DIAGNOSIS — I5022 Chronic systolic (congestive) heart failure: Secondary | ICD-10-CM

## 2022-12-27 DIAGNOSIS — E785 Hyperlipidemia, unspecified: Secondary | ICD-10-CM | POA: Diagnosis not present

## 2022-12-27 DIAGNOSIS — I469 Cardiac arrest, cause unspecified: Secondary | ICD-10-CM | POA: Diagnosis not present

## 2022-12-27 LAB — ECHOCARDIOGRAM COMPLETE
Area-P 1/2: 2.99 cm2
Calc EF: 46.1 %
S' Lateral: 3 cm
Single Plane A2C EF: 45.4 %
Single Plane A4C EF: 48.7 %

## 2022-12-28 ENCOUNTER — Encounter (INDEPENDENT_AMBULATORY_CARE_PROVIDER_SITE_OTHER): Payer: Self-pay

## 2023-02-09 DIAGNOSIS — Z6826 Body mass index (BMI) 26.0-26.9, adult: Secondary | ICD-10-CM | POA: Diagnosis not present

## 2023-02-09 DIAGNOSIS — E119 Type 2 diabetes mellitus without complications: Secondary | ICD-10-CM | POA: Diagnosis not present

## 2023-03-15 ENCOUNTER — Encounter (HOSPITAL_COMMUNITY): Payer: Self-pay

## 2023-04-03 ENCOUNTER — Other Ambulatory Visit (HOSPITAL_COMMUNITY): Payer: Self-pay | Admitting: Adult Health

## 2023-04-03 DIAGNOSIS — I469 Cardiac arrest, cause unspecified: Secondary | ICD-10-CM

## 2023-04-10 ENCOUNTER — Encounter (HOSPITAL_COMMUNITY): Payer: BC Managed Care – PPO

## 2023-04-19 ENCOUNTER — Other Ambulatory Visit (HOSPITAL_COMMUNITY): Payer: Self-pay | Admitting: Adult Health

## 2023-04-19 ENCOUNTER — Other Ambulatory Visit (HOSPITAL_COMMUNITY): Payer: Self-pay | Admitting: Internal Medicine

## 2023-04-19 DIAGNOSIS — I469 Cardiac arrest, cause unspecified: Secondary | ICD-10-CM

## 2023-04-27 ENCOUNTER — Encounter (HOSPITAL_COMMUNITY): Payer: Self-pay

## 2023-04-27 ENCOUNTER — Ambulatory Visit (HOSPITAL_COMMUNITY)
Admission: RE | Admit: 2023-04-27 | Discharge: 2023-04-27 | Disposition: A | Discharge status: Home / Self Care | Payer: BC Managed Care – PPO | Source: Ambulatory Visit | Attending: Cardiology | Admitting: Cardiology

## 2023-04-27 VITALS — BP 94/86 | HR 79 | Wt 180.0 lb

## 2023-04-27 DIAGNOSIS — I252 Old myocardial infarction: Secondary | ICD-10-CM | POA: Diagnosis not present

## 2023-04-27 DIAGNOSIS — I2510 Atherosclerotic heart disease of native coronary artery without angina pectoris: Secondary | ICD-10-CM | POA: Insufficient documentation

## 2023-04-27 DIAGNOSIS — I5022 Chronic systolic (congestive) heart failure: Secondary | ICD-10-CM | POA: Insufficient documentation

## 2023-04-27 DIAGNOSIS — E785 Hyperlipidemia, unspecified: Secondary | ICD-10-CM | POA: Insufficient documentation

## 2023-04-27 DIAGNOSIS — I255 Ischemic cardiomyopathy: Secondary | ICD-10-CM | POA: Diagnosis not present

## 2023-04-27 DIAGNOSIS — I469 Cardiac arrest, cause unspecified: Secondary | ICD-10-CM

## 2023-04-27 DIAGNOSIS — E119 Type 2 diabetes mellitus without complications: Secondary | ICD-10-CM | POA: Diagnosis not present

## 2023-04-27 LAB — CBC
HCT: 43.5 % (ref 39.0–52.0)
Hemoglobin: 14.8 g/dL (ref 13.0–17.0)
MCH: 30.2 pg (ref 26.0–34.0)
MCHC: 34 g/dL (ref 30.0–36.0)
MCV: 88.8 fL (ref 80.0–100.0)
Platelets: 259 10*3/uL (ref 150–400)
RBC: 4.9 MIL/uL (ref 4.22–5.81)
RDW: 13.2 % (ref 11.5–15.5)
WBC: 9.3 10*3/uL (ref 4.0–10.5)
nRBC: 0 % (ref 0.0–0.2)

## 2023-04-27 LAB — COMPREHENSIVE METABOLIC PANEL
ALT: 41 U/L (ref 0–44)
AST: 29 U/L (ref 15–41)
Albumin: 4.3 g/dL (ref 3.5–5.0)
Alkaline Phosphatase: 84 U/L (ref 38–126)
Anion gap: 10 (ref 5–15)
BUN: 20 mg/dL (ref 6–20)
CO2: 24 mmol/L (ref 22–32)
Calcium: 8.8 mg/dL — ABNORMAL LOW (ref 8.9–10.3)
Chloride: 102 mmol/L (ref 98–111)
Creatinine, Ser: 1.09 mg/dL (ref 0.61–1.24)
GFR, Estimated: >60 mL/min (ref >60)
Glucose, Bld: 98 mg/dL (ref 70–99)
Potassium: 4.2 mmol/L (ref 3.5–5.1)
Sodium: 136 mmol/L (ref 135–145)
Total Bilirubin: 0.7 mg/dL (ref 0.3–1.2)
Total Protein: 7.5 g/dL (ref 6.5–8.1)

## 2023-04-27 LAB — LIPID PANEL
Cholesterol: 97 mg/dL (ref 0–200)
HDL: 27 mg/dL — ABNORMAL LOW (ref >40)
LDL Cholesterol: 24 mg/dL (ref 0–99)
Total CHOL/HDL Ratio: 3.6 RATIO
Triglycerides: 228 mg/dL — ABNORMAL HIGH (ref <150)
VLDL: 46 mg/dL — ABNORMAL HIGH (ref 0–40)

## 2023-04-27 MED ORDER — CARVEDILOL 6.25 MG PO TABS: 6.2500 mg | ORAL_TABLET | Freq: Two times a day (BID) | ORAL | 3 refills | Status: DC

## 2023-04-27 NOTE — Progress Notes (Addendum)
[facility] Progress Note     Referring Physician: Dr Swaziland  Primary Care: Dr Tenny Craw Primary Cardiologist:Dr Eden Emms  AHF: Dr. Gala Romney   HPI:  [name] is a 57 year old with a history of DM2, frequent UTIN renal stones, ICM, STEMI 08/2022, and newly reduced EF.  Father, Emelia Loron and uncle had premature CAD. Uncle died sudden cardiac death.   Admitted October 02, 2022 with STEMI--> V Fib arrest. Shocked 7 times given 6 rounds of Epi. Prolonged arrest. HS Trop 28>24,000. Lactic acid >9.  Had cath 48 hours after the event with single vessel coronary disease mid RCA 100%, LAD proximal 50%, and LCx 20%. No PCI. Was managed medically. Echo EF 30%, RV moderately reduced. Started on GDMT, aspirin, plavix and high intensity statin.  Discharged 09/28/22 with Life Vest.   [facility] f/u 2/24, POCU was done and showed improvement in LVEF ~40-45%. Had complete Echo done 4/24, EF 45-50%, RV mildly reduced. LifeVest discontinued.   Last OV, he was doing great, NYAH Class I. Volume stable. However BP was low and Entresto dose was reduced to 12-13 mg bid.   He presents back today for f/u. Here w/ wife. Doing fairly well. No significant dyspnea w/ ADLs. Remains active. Plays basketball and cycles. No limitations w/ exercise. Denies CP. Compliant w/ all meds. BP has been soft, systolic's at home in the 80s-90s. HR also 80s-90s. Reports positional dizziness and 1 recent episode of near syncope. Denies melena and hematochezia. EKG today shows NSR, old inferior infarct, HR 82 bpm.     Cardiac Testing  Echo 08/2022  EF 30% RV moderately reduced. Grade IDD  Cath 08/2022 Severe single-vessel coronary artery disease; culprit lesion for the patient's inferior STEMI and cardiac arrest is likely 100% occlusion of mid RCA.  There is moderate (50-60%) disease involving the proximal LAD and mild (20%) stenosis of the proximal LCx. Mildly elevated left ventricular filling pressure (LVEDP 20 mmHg).   Recommendations: Continue aggressive medical therapy; defer PCI to occluded mid RCA given STEMI onset was ~48 hours ago and patient is without residual angina or shock. Dual antiplatelet therapy with aspirin and clopidogrel for 12 months. Escalate goal-directed medical therapy for acute HFrEF, as tolerated  07/26/22 ETT- no ischemia   2021 CT FFR no significant stenosis   Past Medical History:  Diagnosis Date   GERD (gastroesophageal reflux disease)    hx. reflux.   Hyperlipidemia    tx. with meds(high triglycerides)   Recurrent UTI 06/30/2020   Renal cyst 08/17/2020   Ventral hernia     Current Outpatient Medications  Medication Sig Dispense Refill   acetaminophen (TYLENOL) 325 MG tablet Take 2 tablets (650 mg total) by mouth every 6 (six) hours as needed for moderate pain. 20 tablet 0   aspirin 81 MG chewable tablet Chew 1 tablet (81 mg total) by mouth daily. 30 tablet 2   atorvastatin (LIPITOR) 80 MG tablet TAKE 1 TABLET BY MOUTH EVERY DAY 90 tablet 1   Blood Glucose Monitoring Suppl (ACCU-CHEK GUIDE) w/Device KIT Use in the morning, at noon, and at bedtime. 1 kit 0   Blood Pressure KIT 1 Units by Does not apply route daily as needed (BP). 1 kit 0   carvedilol (COREG) 12.5 MG tablet Take 1 tablet (12.5 mg total) by mouth 2 (two) times daily with a meal. 180 tablet 3   Cholecalciferol (VITAMIN D3) 25 MCG (1000 UT) CAPS Take 1,000 Units by mouth daily.     clopidogrel (PLAVIX)  75 MG tablet Take 1 tablet (75 mg total) by mouth daily with breakfast. 90 tablet 3   dapagliflozin propanediol (FARXIGA) 10 MG TABS tablet Take 1 tablet (10 mg total) by mouth daily. 90 tablet 3   famotidine (PEPCID) 20 MG tablet TAKE 1 TABLET BY MOUTH TWICE A DAY 180 tablet 0   glucose blood test strip Use 1 each in the morning, at noon, and at bedtime. 100 each 0   hydrOXYzine (ATARAX) 10 MG tablet Take 10 mg by mouth 3 (three) times daily as needed.     Multiple Vitamins-Minerals (MULTI COMPLETE) CAPS  Take 1 tablet by mouth daily.     nitroGLYCERIN (NITROSTAT) 0.4 MG SL tablet Place 1 tablet (0.4 mg total) under the tongue every 5 (five) minutes as needed for chest pain. 25 tablet 3   sacubitril-valsartan (ENTRESTO) 24-26 MG Take 1 tablet by mouth 2 (two) times daily. 180 tablet 3   Semaglutide (RYBELSUS) 3 MG TABS Take 1 tablet by mouth daily.     spironolactone (ALDACTONE) 25 MG tablet TAKE 1 TABLET (25 MG TOTAL) BY MOUTH DAILY. 90 tablet 1   tiZANidine (ZANAFLEX) 2 MG tablet Take by mouth.     traZODone (DESYREL) 50 MG tablet Take 50 mg by mouth as needed for sleep.     No current facility-administered medications for this visit.    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  Tobacco Use   Smoking status: Never   Smokeless tobacco: Never  Vaping Use   Vaping status: Never Used  Substance and Sexual Activity   Alcohol use: No   Drug use: No   Sexual activity: Not Currently  Other Topics Concern   Not on file  Social History Narrative   Not on file   Social Determinants of Health   Financial Resource Strain: Low Risk  (09/28/2022)   Overall Financial Resource Strain (CARDIA)    Difficulty of Paying Living Expenses: Not very hard  Food Insecurity: No Food Insecurity (09/28/2022)   Hunger Vital Sign    Worried About Running Out of Food in the Last Year: Never true    Ran Out of Food in the Last Year: Never true  Transportation Needs: No Transportation Needs (09/28/2022)   PRAPARE - Administrator, Civil Service (Medical): No    Lack of Transportation (Non-Medical): No  Physical  Activity: Not on file  Stress: Not on file  Social Connections: Not on file  Intimate Partner Violence: Not on file      Family History  Problem Relation Age of Onset   Heart disease Father    Heart disease Paternal Uncle     There were no vitals filed for this visit.  Wt Readings from Last 3 Encounters:  12/14/22 82.7 kg (182 lb 6.4 oz)  10/12/22 85.3 kg (188 lb)  10/06/22 84.1 kg (185 lb 6.4 oz)     PHYSICAL EXAM: General:  Well appearing. No respiratory difficulty HEENT: normal Neck: supple. no JVD. Carotids 2+ bilat; no bruits. No lymphadenopathy or thyromegaly appreciated. Cor: PMI nondisplaced.  Regular rate & rhythm. No rubs, gallops or murmurs. Lungs: clear Abdomen: soft, nontender, nondistended. No hepatosplenomegaly. No bruits or masses. Good bowel sounds. Extremities: no cyanosis, clubbing, rash, edema Neuro: alert & oriented x 3, cranial nerves grossly intact. moves all 4 extremities w/o difficulty. Affect pleasant.   ECG: NSR 82 bpm, inferior Q waves   ASSESSMENT & PLAN:  1. Chronic HFrEF, ICM  - Inferior STEMI with VF arrest 09/24/22 - Echo 1/24 LVEF 30% with WMA,  RV moderately reduced.  - POCUS echo 2/24 EF 40-45% - 2D Echo 4/24 EF 45-50%, RV mildly reduced - NYHA I. Euvolemic on exam.  - Has frequent low BPs (systolic's 80s-90s) w/ positional dizziness and 1 recent episode of near syncope.  - reduce Coreg to 6.25 mg twice a day - Continue Entresto 12-13 mg twice a day (on low dose given h/o low BP/orthostasis) - Continue spironolactone 25 mg daily  - Continue farxiga 10 mg daily - BMP/BNP today    2. CAD - Inferior STEMI with VF arrest 09/24/22 - Cath 08/2022 occluded mid RCA. Nonobstructive disease in the mid LAD 50-60%, L>R collaterals filling distal RCA Medical management - stable w/o CP  - Continue aspirin + high intensity statin+ coreg.  - Denies gross bleeding w/ DAPT. Check CBC   3. T2DM  - Much improved Hgb A1C 10.8 -> 7.0  - Continue  Farxiga  4. DLD - Lipid Panel 1/24 w/ elevated TG > 400, unable to calculate LDL - he is on Lipitor 80 mg. LDL goal < 55  - repeat LP and HFTs - if TG remains elevated, will need Vascepa   F/u w/ Dr. Gala Romney in 6 months    Jonta Gastineau Sharol Harness PA-C  8:33 AM

## 2023-04-27 NOTE — Patient Instructions (Addendum)
EKG done today.   Labs done today. We will contact you only if your labs are abnormal.  DECREASE Carvedilol to 6.25mg  (1 tablet) by mouth 2 times daily.   No other medication changes were made. Please continue all current medications as prescribed.  Your physician recommends that you schedule a follow-up appointment in: 6 months with Dr. Gala Romney. Please contact our office in December to schedule a February 2025 appointment.   If you have any questions or concerns before your next appointment please send [place] a message through [place] or call our office at [phone].    TO LEAVE A MESSAGE FOR THE NURSE SELECT OPTION 2, PLEASE LEAVE A MESSAGE INCLUDING: YOUR NAME DATE OF BIRTH CALL BACK NUMBER REASON FOR CALL**this is important as we prioritize the call backs  YOU WILL RECEIVE A CALL BACK THE SAME DAY AS LONG AS YOU CALL BEFORE 4:00 PM   Do the following things EVERYDAY: Weigh yourself in the morning before breakfast. Write it down and keep it in a log. Take your medicines as prescribed Eat low salt foods--Limit salt (sodium) to 2000 mg per day.  Stay as active as you can everyday Limit all fluids for the day to less than 2 liters   At the [facility], you and your health needs are our priority. As part of our continuing mission to provide you with exceptional heart care, we have created designated Provider Care Teams. These Care Teams include your primary Cardiologist (physician) and Advanced Practice Providers (APPs- Physician Assistants and Nurse Practitioners) who all work together to provide you with the care you need, when you need it.   You may see any of the following providers on your designated Care Team at your next follow up: Dr Arvilla Meres Dr Marca Ancona Dr. Marcos Eke, NP Robbie Lis, [place] [facility] [place], [place] Brynda Peon, NP Karle Plumber, PharmD   Please be sure to bring in all your medications  bottles to every appointment.    Thank you for choosing [facility]

## 2023-06-07 DIAGNOSIS — R093 Abnormal sputum: Secondary | ICD-10-CM | POA: Diagnosis not present

## 2023-06-07 DIAGNOSIS — R634 Abnormal weight loss: Secondary | ICD-10-CM | POA: Diagnosis not present

## 2023-06-07 DIAGNOSIS — E119 Type 2 diabetes mellitus without complications: Secondary | ICD-10-CM | POA: Diagnosis not present

## 2023-06-11 ENCOUNTER — Encounter: Payer: Self-pay | Admitting: Cardiovascular Disease

## 2023-06-13 NOTE — Telephone Encounter (Signed)
Called and spoke with pt, he stated SBP has still been running low, around 80-90s, per PCP he decreased Carvedilol to 1/4 tab (3.125 mg) BID on Monday and since that time SBP has been better, 110/73 today, denies dizziness, pt will continue on this dose and to monitor BP

## 2023-07-05 ENCOUNTER — Other Ambulatory Visit (HOSPITAL_COMMUNITY): Payer: Self-pay | Admitting: Adult Health

## 2023-07-05 DIAGNOSIS — I469 Cardiac arrest, cause unspecified: Secondary | ICD-10-CM

## 2023-07-06 ENCOUNTER — Other Ambulatory Visit (HOSPITAL_COMMUNITY): Payer: Self-pay | Admitting: *Deleted

## 2023-07-06 DIAGNOSIS — I469 Cardiac arrest, cause unspecified: Secondary | ICD-10-CM

## 2023-07-06 MED ORDER — FAMOTIDINE 20 MG PO TABS: 20.0000 mg | ORAL_TABLET | Freq: Two times a day (BID) | ORAL | 0 refills | Status: DC

## 2023-08-18 DIAGNOSIS — M25529 Pain in unspecified elbow: Secondary | ICD-10-CM | POA: Diagnosis not present

## 2023-08-18 DIAGNOSIS — W19XXXA Unspecified fall, initial encounter: Secondary | ICD-10-CM | POA: Diagnosis not present

## 2023-10-07 ENCOUNTER — Other Ambulatory Visit (HOSPITAL_COMMUNITY): Payer: Self-pay | Admitting: Adult Health

## 2023-10-07 DIAGNOSIS — I469 Cardiac arrest, cause unspecified: Secondary | ICD-10-CM

## 2023-10-09 ENCOUNTER — Other Ambulatory Visit (HOSPITAL_COMMUNITY): Payer: Self-pay | Admitting: *Deleted

## 2023-10-09 DIAGNOSIS — I469 Cardiac arrest, cause unspecified: Secondary | ICD-10-CM

## 2023-10-09 MED ORDER — FAMOTIDINE 20 MG PO TABS: 20.0000 mg | ORAL_TABLET | Freq: Two times a day (BID) | ORAL | 0 refills | Status: DC

## 2023-10-14 ENCOUNTER — Other Ambulatory Visit (HOSPITAL_COMMUNITY): Payer: Self-pay | Admitting: Internal Medicine

## 2023-10-16 ENCOUNTER — Other Ambulatory Visit (HOSPITAL_COMMUNITY): Payer: Self-pay | Admitting: Adult Health

## 2023-10-16 ENCOUNTER — Other Ambulatory Visit (HOSPITAL_COMMUNITY): Payer: Self-pay | Admitting: Internal Medicine

## 2023-10-16 DIAGNOSIS — I469 Cardiac arrest, cause unspecified: Secondary | ICD-10-CM

## 2023-10-18 ENCOUNTER — Other Ambulatory Visit (HOSPITAL_COMMUNITY): Payer: Self-pay | Admitting: Adult Health

## 2023-10-18 DIAGNOSIS — I469 Cardiac arrest, cause unspecified: Secondary | ICD-10-CM

## 2023-10-29 NOTE — Progress Notes (Signed)
 [facility] Progress Note     Referring Physician: Dr Swaziland  Primary Care: Dr Tenny Craw Primary Cardiologist:Dr Eden Emms  AHF: Dr. Gala Romney   Chief complaint: Heart failure  HPI: Brian [name] is a 58 year old with a history of DM2, frequent UTIs/renal stones, CAD s/p STEMI and chronic systolic HF due to iCM  Father, Grandfather and uncle had premature CAD. Uncle died sudden cardiac death.   Admitted 26-Sep-2023 with inferior STEMI--> V Fib arrest. Shocked 7 times given 6 rounds of Epi. Prolonged arrest. HS Trop 28>24,000. Lactic acid >9.  Had cath 48 hours after the event with single vessel coronary disease mid RCA 100%, LAD proximal 50%, and LCx 20%. No PCI. Was managed medically. Echo EF 30%, RV moderately reduced. Started on GDMT, aspirin, plavix and high intensity statin.  Discharged 09/28/22 with Life Vest.   Echo 4/24, EF 45-50%, RV mildly reduced. LifeVest discontinued.   He presents back today for f/u. Here w/ wife. Doing fairly well. No significant dyspnea w/ ADLs. Remains active. Plays basketball and cycles. No limitations w/ exercise. Denies CP. Compliant w/ all meds. BP has been soft, systolic's at home in the 80s-90s. HR also 80s-90s. Reports positional dizziness and 1 recent episode of near syncope. Denies melena and hematochezia. EKG today shows NSR, old inferior infarct, HR 82 bpm.     Cardiac Testing  Echo 08/2022  EF 30% RV moderately reduced. Grade IDD  Cath 08/2022 Severe single-vessel coronary artery disease; culprit lesion for the patient's inferior STEMI and cardiac arrest is likely 100% occlusion of mid RCA.  There is moderate (50-60%) disease involving the proximal LAD and mild (20%) stenosis of the proximal LCx. Mildly elevated left ventricular filling pressure (LVEDP 20 mmHg).  Recommendations: Continue aggressive medical therapy; defer PCI to occluded mid RCA given STEMI onset was ~48 hours ago and patient is without residual angina or shock. Dual  antiplatelet therapy with aspirin and clopidogrel for 12 months. Escalate goal-directed medical therapy for acute HFrEF, as tolerated  07/26/22 ETT- no ischemia   2021 CT FFR no significant stenosis   Past Medical History:  Diagnosis Date   GERD (gastroesophageal reflux disease)    hx. reflux.   Hyperlipidemia    tx. with meds(high triglycerides)   Recurrent UTI 06/30/2020   Renal cyst 08/17/2020   Ventral hernia     Current Outpatient Medications  Medication Sig Dispense Refill   acetaminophen (TYLENOL) 325 MG tablet Take 2 tablets (650 mg total) by mouth every 6 (six) hours as needed for moderate pain. 20 tablet 0   aspirin 81 MG chewable tablet Chew 1 tablet (81 mg total) by mouth daily. 30 tablet 2   atorvastatin (LIPITOR) 80 MG tablet Take 1 tablet (80 mg total) by mouth daily. NEEDS FOLLOW UP APPOINTMENT FOR MORE REFILLS 90 tablet 0   Blood Glucose Monitoring Suppl (ACCU-CHEK GUIDE) w/Device KIT Use in the morning, at noon, and at bedtime. 1 kit 0   Blood Pressure KIT 1 Units by Does not apply route daily as needed (BP). 1 kit 0   carvedilol (COREG) 12.5 MG tablet Take 1 tablet (12.5 mg total) by mouth 2 (two) times daily with a meal. NEEDS FOLLOW UP APPOINTMENT FOR MORE REFILLS 180 tablet 0   Cholecalciferol (VITAMIN D3) 25 MCG (1000 UT) CAPS Take 1,000 Units by mouth daily.     clopidogrel (PLAVIX) 75 MG tablet Take 1 tablet (75 mg total) by mouth daily with breakfast. NEEDS FOLLOW UP APPOINTMENT  FOR MORE REFILLS 90 tablet 0   dapagliflozin propanediol (FARXIGA) 10 MG TABS tablet Take 1 tablet (10 mg total) by mouth daily. NEEDS FOLLOW UP APPOINTMENT FOR MORE REFILLS 90 tablet 0   famotidine (PEPCID) 20 MG tablet Take 1 tablet (20 mg total) by mouth 2 (two) times daily. 180 tablet 0   glucose blood test strip Use 1 each in the morning, at noon, and at bedtime. 100 each 0   hydrOXYzine (ATARAX) 10 MG tablet Take 10 mg by mouth 3 (three) times daily as needed.     Multiple  Vitamins-Minerals (MULTI COMPLETE) CAPS Take 1 tablet by mouth daily.     nitroGLYCERIN (NITROSTAT) 0.4 MG SL tablet Place 1 tablet (0.4 mg total) under the tongue every 5 (five) minutes as needed for chest pain. 25 tablet 3   OZEMPIC, 0.25 OR 0.5 MG/DOSE, 2 MG/3ML SOPN SMARTSIG:0.25 Milligram(s) SUB-Q Once a Week     sacubitril-valsartan (ENTRESTO) 24-26 MG Take 1 tablet by mouth 2 (two) times daily. 180 tablet 3   spironolactone (ALDACTONE) 25 MG tablet TAKE 1 TABLET (25 MG TOTAL) BY MOUTH DAILY. 90 tablet 1   No current facility-administered medications for this encounter.    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  Tobacco Use   Smoking status: Never   Smokeless tobacco: Never  Vaping Use   Vaping status: Never Used  Substance and Sexual Activity   Alcohol use: No   Drug use: No   Sexual activity: Not Currently  Other Topics Concern   Not on file  Social History Narrative   Not on file   Social Drivers of Health   Financial Resource Strain: Low Risk  (09/28/2022)   Overall Financial Resource Strain (CARDIA)    Difficulty of Paying Living Expenses: Not very hard  Food Insecurity: No Food Insecurity (09/28/2022)   Hunger Vital Sign    Worried About Running Out of Food in the Last Year: Never true    Ran Out of Food in the Last Year: Never true  Transportation Needs: No Transportation Needs (09/28/2022)   PRAPARE - Administrator, Civil Service (Medical): No    Lack of Transportation (Non-Medical): No  Physical Activity: Not on file  Stress: Not on file  Social Connections: Not on  file  Intimate Partner Violence: Not on file      Family History  Problem Relation Age of Onset   Heart disease Father    Heart disease Paternal Uncle     There were no vitals filed for this visit.  Wt Readings from Last 3 Encounters:  04/27/23 81.6 kg (180 lb)  12/14/22 82.7 kg (182 lb 6.4 oz)  10/12/22 85.3 kg (188 lb)     PHYSICAL EXAM: General:  Well appearing. No respiratory difficulty HEENT: normal Neck: supple. no JVD. Carotids 2+ bilat; no bruits. No lymphadenopathy or thyromegaly appreciated. Cor: PMI nondisplaced. Regular rate & rhythm. No rubs, gallops or murmurs. Lungs: clear Abdomen: soft, nontender, nondistended. No hepatosplenomegaly. No bruits or masses. Good bowel sounds. Extremities: no cyanosis, clubbing, rash,  edema Neuro: alert & oriented x 3, cranial nerves grossly intact. moves all 4 extremities w/o difficulty. Affect pleasant.   ECG: NSR 82 bpm, inferior Q waves   ASSESSMENT & PLAN:  1. Chronic HFmrEF, ICM  - Inferior STEMI with VF arrest 09/24/22 - Echo 1/24 LVEF 30%  RV moderately reduced.  - 2D Echo 4/24 EF 45-50%, RV mildly reduced - NYHA I. Euvolemic on exam.  - Has frequent low BPs (systolic's 80s-90s) w/ positional dizziness and 1 recent episode of near syncope.  - Continue carvedilol to 6.25 mg twice a day (reduced previously due to intolerance) - Continue Entresto 12-13 mg twice a day (on low dose given h/o low BP/orthostasis) - Continue spironolactone 25 mg daily  - Continue farxiga 10 mg daily   2. CAD - Inferior STEMI with VF arrest 09/24/22 - Cath 08/2022 occluded mid RCA. Nonobstructive disease in the mid LAD 50-60%, L>R collaterals filling distal RCA Medical management - stable w/o CP  - On DAPT + high intensity statin+ coreg.  - One year out can stop DAPT  3. T2DM  - Much improved Hgb A1C 10.8 -> 7.0  - Continue Farxiga  4. Hyperlipidemia/hypertricglyceridemia - Lipid Panel 1/24 w/ elevated TG > 400, unable to calculate  LDL - he is on Lipitor 80 mg. LDL goal < 55  - repeat LP and HFTs - if TG remains elevated, will need Vascepa   Can graduate [facility] and f/u with Dr. Eden Emms.   Arvilla Meres MD 8:43 PM

## 2023-10-30 ENCOUNTER — Ambulatory Visit (HOSPITAL_COMMUNITY)
Admission: RE | Admit: 2023-10-30 | Discharge: 2023-10-30 | Disposition: A | Discharge status: Home / Self Care | Payer: BC Managed Care – PPO | Source: Ambulatory Visit | Attending: Internal Medicine | Admitting: Internal Medicine

## 2023-10-30 ENCOUNTER — Encounter (HOSPITAL_COMMUNITY): Payer: Self-pay | Admitting: Internal Medicine

## 2023-10-30 VITALS — BP 102/70 | HR 85 | Wt 180.8 lb

## 2023-10-30 DIAGNOSIS — Z87442 Personal history of urinary calculi: Secondary | ICD-10-CM | POA: Diagnosis not present

## 2023-10-30 DIAGNOSIS — I255 Ischemic cardiomyopathy: Secondary | ICD-10-CM | POA: Insufficient documentation

## 2023-10-30 DIAGNOSIS — E781 Pure hyperglyceridemia: Secondary | ICD-10-CM | POA: Insufficient documentation

## 2023-10-30 DIAGNOSIS — Z8674 Personal history of sudden cardiac arrest: Secondary | ICD-10-CM | POA: Insufficient documentation

## 2023-10-30 DIAGNOSIS — Z7984 Long term (current) use of oral hypoglycemic drugs: Secondary | ICD-10-CM | POA: Insufficient documentation

## 2023-10-30 DIAGNOSIS — Z87440 Personal history of urinary (tract) infections: Secondary | ICD-10-CM | POA: Diagnosis not present

## 2023-10-30 DIAGNOSIS — I5022 Chronic systolic (congestive) heart failure: Secondary | ICD-10-CM | POA: Diagnosis not present

## 2023-10-30 DIAGNOSIS — Z7902 Long term (current) use of antithrombotics/antiplatelets: Secondary | ICD-10-CM | POA: Diagnosis not present

## 2023-10-30 DIAGNOSIS — Z7982 Long term (current) use of aspirin: Secondary | ICD-10-CM | POA: Insufficient documentation

## 2023-10-30 DIAGNOSIS — Z7985 Long-term (current) use of injectable non-insulin antidiabetic drugs: Secondary | ICD-10-CM | POA: Insufficient documentation

## 2023-10-30 DIAGNOSIS — E119 Type 2 diabetes mellitus without complications: Secondary | ICD-10-CM | POA: Insufficient documentation

## 2023-10-30 DIAGNOSIS — Z8249 Family history of ischemic heart disease and other diseases of the circulatory system: Secondary | ICD-10-CM | POA: Diagnosis not present

## 2023-10-30 DIAGNOSIS — I252 Old myocardial infarction: Secondary | ICD-10-CM | POA: Insufficient documentation

## 2023-10-30 DIAGNOSIS — I2510 Atherosclerotic heart disease of native coronary artery without angina pectoris: Secondary | ICD-10-CM | POA: Diagnosis not present

## 2023-10-30 DIAGNOSIS — Z79899 Other long term (current) drug therapy: Secondary | ICD-10-CM | POA: Diagnosis not present

## 2023-10-30 NOTE — Patient Instructions (Signed)
 Great to see you today!!!  No changes, please continue current medications  Congratulations!!! You have graduated the [facility], please follow-up with Dr Eden Emms at [facility] in 6 months (September)

## 2023-12-01 DIAGNOSIS — Z0000 Encounter for general adult medical examination without abnormal findings: Secondary | ICD-10-CM | POA: Diagnosis not present

## 2023-12-01 DIAGNOSIS — Z23 Encounter for immunization: Secondary | ICD-10-CM | POA: Diagnosis not present

## 2023-12-01 DIAGNOSIS — Z13220 Encounter for screening for lipoid disorders: Secondary | ICD-10-CM | POA: Diagnosis not present

## 2023-12-01 DIAGNOSIS — Z125 Encounter for screening for malignant neoplasm of prostate: Secondary | ICD-10-CM | POA: Diagnosis not present

## 2023-12-01 DIAGNOSIS — E119 Type 2 diabetes mellitus without complications: Secondary | ICD-10-CM | POA: Diagnosis not present

## 2023-12-02 ENCOUNTER — Other Ambulatory Visit (HOSPITAL_COMMUNITY): Payer: Self-pay | Admitting: Adult Health

## 2023-12-02 DIAGNOSIS — I469 Cardiac arrest, cause unspecified: Secondary | ICD-10-CM

## 2024-01-04 ENCOUNTER — Other Ambulatory Visit (HOSPITAL_COMMUNITY): Payer: Self-pay | Admitting: Internal Medicine

## 2024-01-04 DIAGNOSIS — I469 Cardiac arrest, cause unspecified: Secondary | ICD-10-CM

## 2024-01-09 ENCOUNTER — Other Ambulatory Visit (HOSPITAL_COMMUNITY): Payer: Self-pay | Admitting: Internal Medicine

## 2024-01-09 ENCOUNTER — Other Ambulatory Visit (HOSPITAL_COMMUNITY): Payer: Self-pay | Admitting: Adult Health

## 2024-01-09 DIAGNOSIS — I469 Cardiac arrest, cause unspecified: Secondary | ICD-10-CM

## 2024-01-11 ENCOUNTER — Other Ambulatory Visit (HOSPITAL_COMMUNITY): Payer: Self-pay | Admitting: Internal Medicine

## 2024-01-11 DIAGNOSIS — I469 Cardiac arrest, cause unspecified: Secondary | ICD-10-CM

## 2024-04-03 ENCOUNTER — Other Ambulatory Visit (HOSPITAL_COMMUNITY): Payer: Self-pay | Admitting: Internal Medicine

## 2024-04-03 DIAGNOSIS — I469 Cardiac arrest, cause unspecified: Secondary | ICD-10-CM

## 2024-04-18 ENCOUNTER — Other Ambulatory Visit (HOSPITAL_COMMUNITY): Payer: Self-pay | Admitting: Cardiology

## 2024-04-18 DIAGNOSIS — I469 Cardiac arrest, cause unspecified: Secondary | ICD-10-CM

## 2024-04-22 ENCOUNTER — Encounter: Payer: Self-pay | Admitting: Cardiovascular Disease

## 2024-04-25 ENCOUNTER — Other Ambulatory Visit: Payer: Self-pay | Admitting: Cardiovascular Disease

## 2024-04-25 DIAGNOSIS — I469 Cardiac arrest, cause unspecified: Secondary | ICD-10-CM

## 2024-04-25 MED ORDER — DAPAGLIFLOZIN PROPANEDIOL 10 MG PO TABS: 10.0000 mg | ORAL_TABLET | Freq: Every day | ORAL | 0 refills | Status: DC

## 2024-04-25 NOTE — Telephone Encounter (Signed)
 RX routed to Dr. Claiborne nurse for advise.

## 2024-04-25 NOTE — Telephone Encounter (Signed)
 Dr. Delford on previous note stated, okay to refill. Sent in refill to patient's pharmacy.

## 2024-04-25 NOTE — Telephone Encounter (Signed)
*  STAT* If patient is at the pharmacy, call can be transferred to refill team.   1. Which medications need to be refilled? (please list name of each medication and dose if known)   FARXIGA  10 MG TABS tablet     2. Would you like to learn more about the convenience, safety, & potential cost savings by using the [facility]? No    3. Are you open to using the [facility] (Type [facility]. ). No   4. Which pharmacy/location (including street and city if local pharmacy) is medication to be sent to?[id number] - [place], [place] - [address]    5. Do they need a 30 day or 90 day supply? 90 day   Pt has appt with Dr. Delford on 11/11. Pt will be out of medication on Saturday.

## 2024-04-25 NOTE — Telephone Encounter (Signed)
 Pt of Dr. Delford. Recent graduate of [employer]. Followup with Dr. Nishan scheduled for September. Looking for a refill of Farxiga  which was prescribed and refilled at [facility] I believe. Does Dr. Delford want to refill this? Please advise.

## 2024-04-29 ENCOUNTER — Encounter: Payer: Self-pay | Admitting: Cardiovascular Disease

## 2024-04-29 DIAGNOSIS — I469 Cardiac arrest, cause unspecified: Secondary | ICD-10-CM

## 2024-04-30 MED ORDER — SPIRONOLACTONE 25 MG PO TABS: 25.0000 mg | ORAL_TABLET | Freq: Every day | ORAL | 1 refills | Status: AC

## 2024-06-03 DIAGNOSIS — E119 Type 2 diabetes mellitus without complications: Secondary | ICD-10-CM | POA: Diagnosis not present

## 2024-06-03 DIAGNOSIS — Z23 Encounter for immunization: Secondary | ICD-10-CM | POA: Diagnosis not present

## 2024-06-03 DIAGNOSIS — F411 Generalized anxiety disorder: Secondary | ICD-10-CM | POA: Diagnosis not present

## 2024-06-10 ENCOUNTER — Other Ambulatory Visit (HOSPITAL_COMMUNITY): Payer: Self-pay

## 2024-07-01 NOTE — Progress Notes (Signed)
 Referring Physician: Dr Jordan  Primary Care: Dr Okey Primary Cardiologist: Dr Delford  AHF: Dr. Cherrie   Chief complaint: Heart failure  HPI: [name] is a 58 year old with a history of DM2, frequent UTIs/renal stones, CAD s/p STEMI and chronic systolic HF due to iCM I have not seen since 2023. Followed most recently by DB in hospital and CHF clinic   Admitted 1/24 with inferior STEMI--> V Fib arrest. Shocked 7 times given 6 rounds of Epi. Prolonged arrest. HS Trop 28>24,000. Lactic acid >9.  Had cath 48 hours after the event with single vessel coronary disease mid RCA 100%, LAD proximal 50%, and LCx 20%. No PCI. Was managed medically. Echo EF 30%, RV moderately reduced. Started on GDMT, aspirin , plavix  and high intensity statin.  Discharged 09/28/22 with Life Vest.   Echo 4/24, EF 45-50%, RV mildly reduced. LifeVest discontinued.   Back to work doing web designer. Denies SOB No edema. Compliant with meds. Occasional transient sharp right-sided CP with stress. Walks and rides a stationary bike for 45 minues max HR 110. Plays competitive [place].   Cardiac Testing  Echo 08/2022  EF 30% RV moderately reduced. Grade IDD  Cath 08/2022 Severe single-vessel coronary artery disease; culprit lesion for the patient's inferior STEMI and cardiac arrest is likely 100% occlusion of mid RCA.  There is moderate (50-60%) disease involving the proximal LAD and mild (20%) stenosis of the proximal LCx. Mildly elevated left ventricular filling pressure (LVEDP 20 mmHg).  Recommendations: Continue aggressive medical therapy; defer PCI to occluded mid RCA given STEMI onset was ~48 hours ago and patient is without residual angina or shock. Dual antiplatelet therapy with aspirin  and clopidogrel  for 12 months. Escalate goal-directed medical therapy for acute HFrEF, as tolerated  07/26/22 ETT- no ischemia   2021 CT FFR no significant stenosis   Past Medical History:   Diagnosis Date   GERD (gastroesophageal reflux disease)    hx. reflux.   Hyperlipidemia    tx. with meds(high triglycerides)   Recurrent UTI 06/30/2020   Renal cyst 08/17/2020   Ventral hernia     Current Outpatient Medications  Medication Sig Dispense Refill   aspirin  81 MG chewable tablet Chew 1 tablet (81 mg total) by mouth daily. 30 tablet 2   atorvastatin  (LIPITOR ) 80 MG tablet TAKE 1 TABLET (80 MG TOTAL) BY MOUTH DAILY. NEEDS FOLLOW UP APPOINTMENT FOR MORE REFILLS 30 tablet 0   carvedilol  (COREG ) 6.25 MG tablet Take 1 tablet (6.25 mg total) by mouth 2 (two) times daily with a meal. 60 tablet 3   Cholecalciferol (VITAMIN D3) 25 MCG (1000 UT) CAPS Take 1,000 Units by mouth daily.     dapagliflozin  propanediol (FARXIGA ) 10 MG TABS tablet Take 1 tablet (10 mg total) by mouth daily. 90 tablet 0   ENTRESTO  24-26 MG TAKE 1 TABLET BY MOUTH TWICE A DAY 180 tablet 3   famotidine  (PEPCID ) 20 MG tablet TAKE 1 TABLET BY MOUTH TWICE A DAY 180 tablet 0   Multiple Vitamins-Minerals (MULTI COMPLETE) CAPS Take 1 tablet by mouth daily.     OZEMPIC, 0.25 OR 0.5 MG/DOSE, 2 MG/3ML SOPN SMARTSIG:0.25 Milligram(s) SUB-Q Once a Week     spironolactone  (ALDACTONE ) 25 MG tablet Take 1 tablet (25 mg total) by mouth daily. 90 tablet 1   acetaminophen  (TYLENOL ) 325 MG tablet Take 2 tablets (650 mg total) by mouth every 6 (six) hours as needed for moderate pain. (Patient not taking: Reported on  07/09/2024) 20 tablet 0   Blood Glucose Monitoring Suppl (ACCU-CHEK GUIDE) w/Device KIT Use in the morning, at noon, and at bedtime. 1 kit 0   Blood Pressure KIT 1 Units by Does not apply route daily as needed (BP). (Patient not taking: Reported on 07/09/2024) 1 kit 0   clopidogrel  (PLAVIX ) 75 MG tablet TAKE 1 TABLET (75 MG TOTAL) BY MOUTH DAILY WITH BREAKFAST. NEEDS FOLLOW UP APPOINTMENT FOR MORE REFILLS 90 tablet 0   glucose blood test strip Use 1 each in the morning, at noon, and at bedtime. 100 each 0   hydrOXYzine  (ATARAX) 10 MG tablet Take 10 mg by mouth 3 (three) times daily as needed. (Patient not taking: Reported on 07/09/2024)     nitroGLYCERIN  (NITROSTAT ) 0.4 MG SL tablet Place 1 tablet (0.4 mg total) under the tongue every 5 (five) minutes as needed for chest pain. (Patient not taking: Reported on 07/09/2024) 25 tablet 3   No current facility-administered medications for this visit.    Allergies  Allergen Reactions   Macrobid [Nitrofurantoin] Other (See Comments)   Morphine  Nausea And Vomiting and Other (See Comments)    Other Reaction(s): shock   Niacin Rash      Social History   Socioeconomic History   Marital status: Married    Spouse name: Brian [name]   Number of children: 2   Years of education: Not on file   Highest education level: Bachelor's degree (e.g., BA, AB, BS)  Occupational History   Occupation: Mudlogger  Tobacco Use   Smoking status: Never   Smokeless tobacco: Never  Vaping Use   Vaping status: Never Used  Substance and Sexual Activity   Alcohol use: No   Drug use: No   Sexual activity: Not Currently  Other Topics Concern   Not on file  Social History Narrative   Not on file   Social Drivers of Health   Financial Resource Strain: Low Risk  (09/28/2022)   Overall Financial Resource Strain (CARDIA)    Difficulty of Paying Living Expenses: Not very hard  Food Insecurity: No Food Insecurity (09/28/2022)   Hunger Vital Sign    Worried About Running Out of Food in the Last Year: Never true    Ran Out of Food in the Last Year: Never true  Transportation Needs: No Transportation Needs (09/28/2022)   PRAPARE - Administrator, Civil Service (Medical): No    Lack of Transportation (Non-Medical): No  Physical Activity: Not on file  Stress: Not on file  Social Connections: Not on file  Intimate Partner Violence: Not on file      Family History  Problem Relation Age of Onset   Heart disease Father    Heart disease Paternal Uncle     Vitals:    07/09/24 0858  BP: 90/62  Pulse: 80  SpO2: 98%  Weight: 173 lb (78.5 kg)  Height: 5' 10 (1.778 m)     Wt Readings from Last 3 Encounters:  07/09/24 173 lb (78.5 kg)  10/30/23 180 lb 12.8 oz (82 kg)  04/27/23 180 lb (81.6 kg)     PHYSICAL EXAM: Affect appropriate Healthy:  appears stated age HEENT: normal Neck supple with no adenopathy JVP normal no bruits no thyromegaly Lungs clear with no wheezing and good diaphragmatic motion Heart:  S1/S2 no murmur, no rub, gallop or click PMI normal Abdomen: benighn, BS positve, no tenderness, no AAA no bruit.  No HSM or HJR Distal pulses intact with no bruits No  edema Neuro non-focal Skin warm and dry No muscular weakness    ECG: NSR 79 No ST-T wave abnormalities. Personally reviewed   ASSESSMENT & PLAN:  1. Chronic HFmrEF, ICM  - Inferior STEMI with VF arrest 09/24/22 - Echo 1/24 LVEF 30%  RV moderately reduced.  - 2D Echo 4/24 EF 45-50%, RV mildly reduced - NYHA I. Volume status looks good  - In past had problems with low BPs and orthostasis. This has improved - Continue carvedilol  to 6.25 mg twice a day (reduced previously due to intolerance) - Continue Entresto  12-13 mg twice a day (on low dose given h/o low BP/orthostasis) - Continue spironolactone  25 mg daily  - Continue farxiga  10 mg daily  - No changes - Discussed f/u MRI vs echo. Favor former for quantitative EF , scar burden and viability.   2. CAD - Inferior STEMI with VF arrest 09/24/22 - Cath 08/2022 occluded mid RCA. Nonobstructive disease in the mid LAD 50-60%, L>R collaterals filling distal RCA Medical management - No s/s angina. He remains acitve.  - On ASA + high intensity statin+ coreg .  - DAPT stopped on ASA only post 1 year f/u  3. T2DM  - Much improved Hgb A1C 10.8 -> 7.0  - Continue Farxiga   4. Hyperlipidemia/hypertricglyceridemia - Lipid Panel 1/24 w/ elevated TG > 400, unable to calculate LDL - he is on Lipitor  80 mg. LDL goal < 55  -  Followed by PCP - If TGs remain high can consider Vascepa   Cardiac MRI for EF and viability since no intervention done BMET, BNP, lipids and Liver   F/U in 6 months   Maude Emmer MD 9:01 AM

## 2024-07-06 ENCOUNTER — Other Ambulatory Visit (HOSPITAL_COMMUNITY): Payer: Self-pay | Admitting: Internal Medicine

## 2024-07-06 DIAGNOSIS — I469 Cardiac arrest, cause unspecified: Secondary | ICD-10-CM

## 2024-07-09 ENCOUNTER — Ambulatory Visit: Attending: Cardiovascular Disease | Admitting: Cardiovascular Disease

## 2024-07-09 ENCOUNTER — Encounter: Payer: Self-pay | Admitting: Cardiovascular Disease

## 2024-07-09 VITALS — BP 90/62 | HR 80 | Ht 70.0 in | Wt 173.0 lb

## 2024-07-09 DIAGNOSIS — I469 Cardiac arrest, cause unspecified: Secondary | ICD-10-CM

## 2024-07-09 DIAGNOSIS — I5022 Chronic systolic (congestive) heart failure: Secondary | ICD-10-CM | POA: Diagnosis not present

## 2024-07-09 DIAGNOSIS — E785 Hyperlipidemia, unspecified: Secondary | ICD-10-CM

## 2024-07-09 DIAGNOSIS — E781 Pure hyperglyceridemia: Secondary | ICD-10-CM | POA: Diagnosis not present

## 2024-07-09 DIAGNOSIS — I2510 Atherosclerotic heart disease of native coronary artery without angina pectoris: Secondary | ICD-10-CM | POA: Diagnosis not present

## 2024-07-09 MED ORDER — FAMOTIDINE 20 MG PO TABS: 20.0000 mg | ORAL_TABLET | Freq: Two times a day (BID) | ORAL | 0 refills | Status: AC

## 2024-07-09 NOTE — Patient Instructions (Addendum)
 Medication Instructions:  Your physician recommends that you continue on your current medications as directed. Please refer to the Current Medication list given to you today.  *If you need a refill on your cardiac medications before your next appointment, please call your pharmacy*  Lab Work: BMP, BNP, Lipid panel, Liver panel -- Today  You may go to any [facility] Location for your lab work:  [facility] - [address] ([facility]) - [address] [phone] [address]  [place] - [address]   [place]  - [address]   [place] - [address] - [address] [place]  - [address] [place] - [address] ([facility]   If you have labs (blood work) drawn today and your tests are completely normal, you will receive your results only by: [employer] (if you have MyChart)  If you have any lab test that is abnormal or we need to change your treatment, we will call you or send a MyChart message to review the results.  Testing/Procedures:   [facility] [address] [place], [place] [address] [phone] Please take advantage of the free valet parking available at the [facility] and [employer] (Entrance C).  Proceed to the [facility] Radiology Department (First Floor) for check-in.    Magnetic resonance imaging (MRI) is a painless test that produces images of the inside of the body without using Xrays.   During an MRI, strong magnets and radio waves work together in a data processing manager to form detailed images.    MRI images may provide more details about a medical condition than X-rays, CT scans, and ultrasounds can provide.   You may be given earphones to listen for instructions.   You may eat a light breakfast and take medications as ordered with the exception of furosemide, hydrochlorothiazide, or spironolactone (fluid pill, other).  Please avoid stimulants for 12 hr prior to test. (Ie. Caffeine, nicotine, chocolate, or antihistamine medications)   If a contrast material will be used, an IV will be inserted into one of your veins. Contrast material will be injected into your IV. It will leave your body through your urine within a day. You may be told to drink plenty of fluids to help flush the contrast material out of your system.   You will be asked to remove all metal, including: Watch, jewelry, and other metal objects including hearing aids, hair pieces and dentures. Also wearable glucose monitoring systems (ie. Freestyle Libre and Omnipods) (Braces and fillings normally are not a problem.)   TEST WILL TAKE APPROXIMATELY 1 HOUR   PLEASE NOTIFY SCHEDULING AT LEAST 24 HOURS IN ADVANCE IF YOU ARE UNABLE TO KEEP YOUR APPOINTMENT. [phone]   For more information and frequently asked questions, please visit our website : [email or web address]   Please call Camie Shutter, cardiac imaging nurse navigator with any questions/concerns. Camie Shutter RN Navigator Cardiac Imaging Chantal Requena RN Navigator Cardiac Imaging [facility] [phone] Office    Follow-Up: At [facility], you and your health needs are our priority.  As part of our continuing mission to provide you with exceptional heart care, we have created designated Provider Care Teams.  These Care Teams include your primary Cardiologist (physician) and Advanced Practice Providers (APPs -  Physician Assistants and Nurse Practitioners) who all work together to provide you with the care  you need, when you need it.    Your next appointment:   6 months  The format for your next appointment:   In Person  Provider:   Maude Emmer, MD

## 2024-07-10 ENCOUNTER — Ambulatory Visit: Payer: Self-pay | Admitting: Cardiovascular Disease

## 2024-07-10 LAB — HEPATIC FUNCTION PANEL
ALT: 43 IU/L (ref 0–44)
AST: 28 IU/L (ref 0–40)
Albumin: 4.7 g/dL (ref 3.8–4.9)
Alkaline Phosphatase: 77 IU/L (ref 47–123)
Bilirubin Total: 0.9 mg/dL (ref 0.0–1.2)
Bilirubin, Direct: 0.31 mg/dL (ref 0.00–0.40)
Total Protein: 7.6 g/dL (ref 6.0–8.5)

## 2024-07-10 LAB — BASIC METABOLIC PANEL WITH GFR
BUN/Creatinine Ratio: 16 (ref 9–20)
BUN: 17 mg/dL (ref 6–24)
CO2: 23 mmol/L (ref 20–29)
Calcium: 9.7 mg/dL (ref 8.7–10.2)
Chloride: 99 mmol/L (ref 96–106)
Creatinine, Ser: 1.09 mg/dL (ref 0.76–1.27)
Glucose: 115 mg/dL — ABNORMAL HIGH (ref 70–99)
Potassium: 4.6 mmol/L (ref 3.5–5.2)
Sodium: 137 mmol/L (ref 134–144)
eGFR: 78 mL/min/1.73 (ref >59)

## 2024-07-10 LAB — LIPID PANEL
Chol/HDL Ratio: 2.7 ratio (ref 0.0–5.0)
Cholesterol, Total: 120 mg/dL (ref 100–199)
HDL: 44 mg/dL (ref >39)
LDL Chol Calc (NIH): 59 mg/dL (ref 0–99)
Triglycerides: 87 mg/dL (ref 0–149)
VLDL Cholesterol Cal: 17 mg/dL (ref 5–40)

## 2024-07-10 LAB — PRO B NATRIURETIC PEPTIDE: NT-Pro BNP: 41 pg/mL (ref 0–210)

## 2024-07-11 ENCOUNTER — Encounter: Payer: Self-pay | Admitting: Cardiovascular Disease

## 2024-07-15 ENCOUNTER — Encounter (HOSPITAL_COMMUNITY): Payer: Self-pay

## 2024-07-17 ENCOUNTER — Ambulatory Visit (HOSPITAL_COMMUNITY)
Admission: RE | Admit: 2024-07-17 | Discharge: 2024-07-17 | Disposition: A | Discharge status: Home / Self Care | Source: Ambulatory Visit | Attending: Cardiovascular Disease | Admitting: Cardiovascular Disease

## 2024-07-17 ENCOUNTER — Other Ambulatory Visit: Payer: Self-pay | Admitting: Cardiovascular Disease

## 2024-07-17 DIAGNOSIS — I5022 Chronic systolic (congestive) heart failure: Secondary | ICD-10-CM | POA: Insufficient documentation

## 2024-07-17 MED ORDER — GADOBUTROL 1 MMOL/ML IV SOLN
10.0000 mL | Freq: Once | INTRAVENOUS | Status: AC | PRN
  Administered 2024-07-17: 10 mL via INTRAVENOUS

## 2024-07-28 ENCOUNTER — Encounter: Payer: Self-pay | Admitting: Cardiovascular Disease

## 2024-07-28 ENCOUNTER — Other Ambulatory Visit: Payer: Self-pay | Admitting: Cardiovascular Disease

## 2024-07-28 DIAGNOSIS — I469 Cardiac arrest, cause unspecified: Secondary | ICD-10-CM

## 2024-07-29 MED ORDER — DAPAGLIFLOZIN PROPANEDIOL 10 MG PO TABS: 10.0000 mg | ORAL_TABLET | Freq: Every day | ORAL | 1 refills | Status: AC

## 2024-07-29 MED ORDER — ATORVASTATIN CALCIUM 80 MG PO TABS: 80.0000 mg | ORAL_TABLET | Freq: Every day | ORAL | 3 refills | Status: AC

## 2024-09-01 ENCOUNTER — Other Ambulatory Visit (HOSPITAL_COMMUNITY): Payer: Self-pay | Admitting: Internal Medicine

## 2024-09-01 DIAGNOSIS — I469 Cardiac arrest, cause unspecified: Secondary | ICD-10-CM

## 2024-09-02 DIAGNOSIS — I469 Cardiac arrest, cause unspecified: Secondary | ICD-10-CM

## 2024-09-03 MED ORDER — SACUBITRIL-VALSARTAN 24-26 MG PO TABS: 1.0000 | ORAL_TABLET | Freq: Two times a day (BID) | ORAL | 3 refills | Status: AC
